# Patient Record
Sex: Female | Born: 1975 | Race: Black or African American | Hispanic: No | Marital: Single
Health system: Midwestern US, Community
[De-identification: ages and names within clinical notes are randomized; demographics above are authoritative.]

## PROBLEM LIST (undated history)

## (undated) DIAGNOSIS — R002 Palpitations: Secondary | ICD-10-CM

## (undated) DIAGNOSIS — D75839 Thrombocytosis, unspecified: Secondary | ICD-10-CM

## (undated) DIAGNOSIS — M549 Dorsalgia, unspecified: Secondary | ICD-10-CM

## (undated) DIAGNOSIS — F329 Major depressive disorder, single episode, unspecified: Secondary | ICD-10-CM

## (undated) DIAGNOSIS — D473 Essential (hemorrhagic) thrombocythemia: Secondary | ICD-10-CM

## (undated) DIAGNOSIS — E119 Type 2 diabetes mellitus without complications: Secondary | ICD-10-CM

## (undated) DIAGNOSIS — F32A Depression, unspecified: Secondary | ICD-10-CM

## (undated) DIAGNOSIS — F319 Bipolar disorder, unspecified: Secondary | ICD-10-CM

## (undated) DIAGNOSIS — F419 Anxiety disorder, unspecified: Secondary | ICD-10-CM

## (undated) DIAGNOSIS — F909 Attention-deficit hyperactivity disorder, unspecified type: Secondary | ICD-10-CM

## (undated) DIAGNOSIS — G8929 Other chronic pain: Secondary | ICD-10-CM

## (undated) HISTORY — DX: Anxiety disorder, unspecified: F41.9

## (undated) HISTORY — DX: Other chronic pain: G89.29

## (undated) HISTORY — PX: CHOLECYSTECTOMY: SHX55

## (undated) HISTORY — DX: Depression, unspecified: F32.A

## (undated) HISTORY — DX: Major depressive disorder, single episode, unspecified: F32.9

## (undated) HISTORY — DX: Attention-deficit hyperactivity disorder, unspecified type: F90.9

## (undated) HISTORY — DX: Bipolar disorder, unspecified: F31.9

## (undated) HISTORY — DX: Dorsalgia, unspecified: M54.9

## (undated) HISTORY — DX: Essential (hemorrhagic) thrombocythemia: D47.3

## (undated) HISTORY — DX: Thrombocytosis, unspecified: D75.839

## (undated) MED ORDER — FLUOXETINE 40 MG CAP
40 mg | ORAL_CAPSULE | Freq: Every day | ORAL | Status: AC
Start: ? — End: 2012-10-25

## (undated) MED ORDER — CLONAZEPAM 2 MG TAB
2 mg | ORAL_TABLET | Freq: Two times a day (BID) | ORAL | Status: AC
Start: ? — End: 2012-10-25

## (undated) MED ORDER — AMPHETAMINE-DEXTROAMPHETAMINE 12.5 MG TAB
12.5 mg | ORAL_TABLET | Freq: Every day | ORAL | Status: AC
Start: ? — End: 2012-10-25

## (undated) MED ORDER — FLURAZEPAM 15 MG CAP: 15 mg | ORAL_CAPSULE | Freq: Every evening | ORAL | Status: AC | PRN

## (undated) MED ORDER — LISDEXAMFETAMINE 70 MG CAPSULE
70 mg | ORAL_CAPSULE | ORAL | Status: AC
Start: ? — End: 2012-10-25

## (undated) MED ORDER — CLONIDINE 0.2 MG TAB: 0.2 mg | ORAL_TABLET | Freq: Every day | ORAL | Status: AC

---

## 2009-07-04 ENCOUNTER — Emergency Department (HOSPITAL_COMMUNITY): Admission: EM | Admit: 2009-07-04 | Discharge: 2009-07-04 | Payer: Self-pay | Admitting: Emergency Medicine

## 2009-07-23 ENCOUNTER — Encounter: Payer: Self-pay | Admitting: Family Medicine

## 2009-08-03 ENCOUNTER — Encounter: Payer: Self-pay | Admitting: *Deleted

## 2009-09-08 ENCOUNTER — Encounter: Payer: Self-pay | Admitting: Family Medicine

## 2009-09-08 ENCOUNTER — Ambulatory Visit: Payer: Self-pay | Admitting: Family Medicine

## 2009-09-08 ENCOUNTER — Other Ambulatory Visit: Admission: RE | Admit: 2009-09-08 | Discharge: 2009-09-08 | Payer: Self-pay | Admitting: Family Medicine

## 2009-09-08 DIAGNOSIS — F319 Bipolar disorder, unspecified: Secondary | ICD-10-CM | POA: Insufficient documentation

## 2009-09-08 DIAGNOSIS — E669 Obesity, unspecified: Secondary | ICD-10-CM | POA: Insufficient documentation

## 2009-09-08 LAB — CONVERTED CEMR LAB: Beta hcg, urine, semiquantitative: NEGATIVE

## 2009-09-09 ENCOUNTER — Encounter: Payer: Self-pay | Admitting: Family Medicine

## 2009-09-09 LAB — CONVERTED CEMR LAB
Alkaline Phosphatase: 61 units/L (ref 39–117)
BUN: 11 mg/dL (ref 6–23)
CO2: 24 meq/L (ref 19–32)
Chloride: 101 meq/L (ref 96–112)
Glucose, Bld: 80 mg/dL (ref 70–99)
HDL: 65 mg/dL (ref >39)
LDL Cholesterol: 85 mg/dL (ref 0–99)
Total Bilirubin: 0.3 mg/dL (ref 0.3–1.2)
Total CHOL/HDL Ratio: 2.6
Total Protein: 6.8 g/dL (ref 6.0–8.3)

## 2009-09-10 ENCOUNTER — Encounter: Payer: Self-pay | Admitting: Family Medicine

## 2009-09-22 ENCOUNTER — Encounter: Payer: Self-pay | Admitting: Family Medicine

## 2009-09-22 ENCOUNTER — Ambulatory Visit: Payer: Self-pay | Admitting: Family Medicine

## 2009-09-22 DIAGNOSIS — Z975 Presence of (intrauterine) contraceptive device: Secondary | ICD-10-CM

## 2009-09-22 LAB — CONVERTED CEMR LAB: Beta hcg, urine, semiquantitative: NEGATIVE

## 2009-09-23 ENCOUNTER — Encounter: Payer: Self-pay | Admitting: Family Medicine

## 2009-09-24 ENCOUNTER — Telehealth: Payer: Self-pay | Admitting: Family Medicine

## 2009-10-12 LAB — CONVERTED CEMR LAB
ALT: 12 units/L (ref 0–35)
Alkaline Phosphatase: 66 units/L (ref 39–117)
BUN: 11 mg/dL (ref 6–23)
Chloride: 104 meq/L (ref 96–112)
Glucose, Bld: 86 mg/dL (ref 70–99)
Potassium: 4 meq/L (ref 3.5–5.3)
Sodium: 140 meq/L (ref 135–145)
Total Bilirubin: 0.3 mg/dL (ref 0.3–1.2)

## 2009-10-20 ENCOUNTER — Ambulatory Visit: Payer: Self-pay | Admitting: Family Medicine

## 2009-10-22 ENCOUNTER — Telehealth: Payer: Self-pay | Admitting: Psychology

## 2009-10-25 ENCOUNTER — Encounter: Payer: Self-pay | Admitting: Family Medicine

## 2009-10-25 ENCOUNTER — Ambulatory Visit: Payer: Self-pay | Admitting: Family Medicine

## 2009-11-03 ENCOUNTER — Encounter: Payer: Self-pay | Admitting: Family Medicine

## 2009-11-03 ENCOUNTER — Ambulatory Visit: Payer: Self-pay | Admitting: Family Medicine

## 2009-11-03 LAB — CONVERTED CEMR LAB
MCHC: 32.6 g/dL (ref 30.0–36.0)
RBC: 4.38 M/uL (ref 3.87–5.11)
RDW: 13.2 % (ref 11.5–15.5)
TSH: 1.45 microintl units/mL (ref 0.350–4.500)
Vitamin B-12: >2000 pg/mL — ABNORMAL HIGH (ref 211–911)

## 2009-11-04 ENCOUNTER — Encounter: Payer: Self-pay | Admitting: Family Medicine

## 2009-11-09 ENCOUNTER — Other Ambulatory Visit: Payer: Self-pay

## 2009-11-09 ENCOUNTER — Other Ambulatory Visit: Payer: Self-pay | Admitting: Emergency Medicine

## 2009-11-10 ENCOUNTER — Inpatient Hospital Stay (HOSPITAL_COMMUNITY): Admission: AD | Admit: 2009-11-10 | Discharge: 2009-11-13 | Payer: Self-pay | Admitting: Psychiatry

## 2009-11-10 ENCOUNTER — Ambulatory Visit: Payer: Self-pay | Admitting: Psychiatry

## 2009-11-15 ENCOUNTER — Ambulatory Visit: Payer: Self-pay | Admitting: Family Medicine

## 2009-11-15 LAB — CONVERTED CEMR LAB

## 2009-11-16 ENCOUNTER — Encounter: Payer: Self-pay | Admitting: Family Medicine

## 2009-11-16 ENCOUNTER — Telehealth (INDEPENDENT_AMBULATORY_CARE_PROVIDER_SITE_OTHER): Payer: Self-pay | Admitting: *Deleted

## 2009-11-17 ENCOUNTER — Encounter: Payer: Self-pay | Admitting: *Deleted

## 2009-11-19 ENCOUNTER — Telehealth: Payer: Self-pay | Admitting: *Deleted

## 2009-11-22 ENCOUNTER — Ambulatory Visit (HOSPITAL_COMMUNITY): Admission: RE | Admit: 2009-11-22 | Discharge: 2009-11-22 | Payer: Self-pay | Admitting: Family Medicine

## 2009-11-23 ENCOUNTER — Encounter: Payer: Self-pay | Admitting: Family Medicine

## 2009-11-24 ENCOUNTER — Telehealth: Payer: Self-pay | Admitting: Family Medicine

## 2009-11-26 ENCOUNTER — Telehealth: Payer: Self-pay | Admitting: Family Medicine

## 2009-12-08 ENCOUNTER — Encounter: Payer: Self-pay | Admitting: Family Medicine

## 2009-12-17 ENCOUNTER — Encounter: Payer: Self-pay | Admitting: Family Medicine

## 2009-12-29 ENCOUNTER — Telehealth: Payer: Self-pay | Admitting: Family Medicine

## 2010-01-06 ENCOUNTER — Encounter: Payer: Self-pay | Admitting: Family Medicine

## 2010-01-06 ENCOUNTER — Emergency Department (HOSPITAL_COMMUNITY): Admission: EM | Admit: 2010-01-06 | Discharge: 2010-01-06 | Payer: Self-pay | Admitting: Emergency Medicine

## 2010-01-11 ENCOUNTER — Telehealth: Payer: Self-pay | Admitting: Family Medicine

## 2010-02-07 ENCOUNTER — Encounter: Payer: Self-pay | Admitting: Family Medicine

## 2010-02-24 ENCOUNTER — Ambulatory Visit: Payer: Self-pay | Admitting: Family Medicine

## 2010-02-24 DIAGNOSIS — F4001 Agoraphobia with panic disorder: Secondary | ICD-10-CM

## 2010-02-24 DIAGNOSIS — F429 Obsessive-compulsive disorder, unspecified: Secondary | ICD-10-CM

## 2010-02-28 ENCOUNTER — Telehealth: Payer: Self-pay | Admitting: Family Medicine

## 2010-03-01 ENCOUNTER — Encounter: Payer: Self-pay | Admitting: Family Medicine

## 2010-03-01 ENCOUNTER — Ambulatory Visit: Payer: Self-pay | Admitting: Family Medicine

## 2010-03-01 DIAGNOSIS — R609 Edema, unspecified: Secondary | ICD-10-CM | POA: Insufficient documentation

## 2010-03-01 LAB — CONVERTED CEMR LAB
AST: 29 units/L (ref 0–37)
Albumin: 4 g/dL (ref 3.5–5.2)
Alkaline Phosphatase: 112 units/L (ref 39–117)
CO2: 26 meq/L (ref 19–32)
Calcium: 9.1 mg/dL (ref 8.4–10.5)
Chloride: 105 meq/L (ref 96–112)
HCT: 38.3 % (ref 36.0–46.0)
MCHC: 32.6 g/dL (ref 30.0–36.0)
Platelets: 337 10*3/uL (ref 150–400)
RBC: 4.21 M/uL (ref 3.87–5.11)
Sodium: 140 meq/L (ref 135–145)
TSH: 1.645 microintl units/mL (ref 0.350–4.500)
Total Bilirubin: 0.2 mg/dL — ABNORMAL LOW (ref 0.3–1.2)
Total Protein: 6.8 g/dL (ref 6.0–8.3)

## 2010-03-03 ENCOUNTER — Telehealth: Payer: Self-pay | Admitting: *Deleted

## 2010-03-10 ENCOUNTER — Ambulatory Visit: Payer: Self-pay | Admitting: Family Medicine

## 2010-03-10 DIAGNOSIS — G4700 Insomnia, unspecified: Secondary | ICD-10-CM

## 2010-03-14 ENCOUNTER — Ambulatory Visit: Payer: Self-pay | Admitting: Family Medicine

## 2010-03-14 DIAGNOSIS — N898 Other specified noninflammatory disorders of vagina: Secondary | ICD-10-CM | POA: Insufficient documentation

## 2010-04-19 ENCOUNTER — Telehealth: Payer: Self-pay | Admitting: Family Medicine

## 2010-08-02 ENCOUNTER — Encounter (INDEPENDENT_AMBULATORY_CARE_PROVIDER_SITE_OTHER): Payer: Self-pay | Admitting: *Deleted

## 2010-09-06 ENCOUNTER — Encounter: Payer: Self-pay | Admitting: Family Medicine

## 2010-10-04 NOTE — Assessment & Plan Note (Signed)
Summary: depression/anxiety   Vital Signs:  Patient profile:   35 year old female Height:      64.5 inches Weight:      198.6 pounds BMI:     33.68 Temp:     98.6 degrees F oral Pulse rate:   76 / minute BP sitting:   137 / 87  (right arm) Cuff size:   regular  Vitals Entered By: Garen Grams LPN (November 04, 9526 3:07 PM) CC: f/u depression/anxiety Is Patient Diabetic? No Pain Assessment Patient in pain? no        Primary Care Provider:  Romero Belling MD  CC:  f/u depression/anxiety.  History of Present Illness: 35 yo female presenting for follow up of what she primarily describes as an anxiety disorder, though in my opinion has presented predominantly as major depressive disorder.  Has been increasingly irritable, aggressive, hopeless, amotivational, for the past couple of weeks.  Also endorses insomnia and passive thoughts of death; though she denies suicidal ideation.  Has increased from 75 mg Venlafaxine to 225 mg  and has been at this dose now for 2 weeks.  Was seen by another physician in this clinic last week for increasing anxiety symptoms, which she attributed to the switch from Clonazepam to Lorazepam, so she was switched back to Clonazepam and was also started on Propranolol.  At this time she doesn't believe the Propranolol is working though she is taking it rarely if ever.  She also does not think the Clonazepam is working, though again she is not taking it regularly.  Also at that visit, she complained of insomnia and was given a prescription for Sonata, though this was not filled because insurance did not cover it.  She has persistent insomnia.  She is not doing anything pleasurable these past few weeks, and essentially seems to be spending her days in bed.  Had a hard time working up the motivation to come to today's visit.  Prior to coming to this clinic, had been on Effexor XR 225 mg by mouth daily and was getting brand name medication from [facility] company assistance  program.  She had been on a much reduced dose of 75 mg by mouth daily of generic Venlafaxine when we met and together we quickly escalated the dose.  She states that generic Venlafaxine is ineffective for her and has brought in a form for the [place] assistance program today.  She has an appointment scheduled with our clinical psychologist, Dr. Pascal Lux, on 03/10.  ** GREATER THAN 25 MINUTES SPENT, MORE THAN 50% IN FACE-TO-FACE COUNSELLING.  Habits & Providers  Alcohol-Tobacco-Diet     Tobacco Status: never  Allergies (verified): No Known Drug Allergies  Physical Exam  General:  Alert and oriented, no acute distress. Psych:  Oriented X3, depressed affect, subdued, withdrawn, poor eye contact, tearful, moderately anxious, easily distracted, poor concentration, and judgment fair.   Additional Exam:  PHQ 9 1 - 3 2 - 3 3 - 3 4 - 3 5 - 3 6 - 3 7 - 3 8 - 3 9 - 2   SUICIDAL?? -- NO TOTAL:  26 10 - DIFFICULTY = EXTREMELY  GAD 7 1 - 3 2 - 3 3 - 3 4 - 3 5 - 3 6 - 3 7 - 3 TOTAL:  21 DIFFICULTY = EXTREMELY    Impression & Recommendations:  Problem # 1:  DEPRESSION, MAJOR, RECURRENT, SEVERE (ICD-296.33) Assessment Deteriorated Very poorly controlled at this point, though is not suicidal.  Has lost her job now that she has been out of work due to mood.  - Continue Effexor XR at 225 mg by mouth daily.  Is currently on generic, which she feels is ineffective, though we completed paperwork today to obtain name brand Effexor XR (75 mg + 150 mg) from the company.  - Have discontinued Lorazepam and restarted Clonazepam, though I have not added Lorazepam to her allergy list as the symptoms she described last week were more consistent with uncontrolled depression rather than side effects.  - Discontinue Propranolol as she wasn't really taking it regularly.  - Encouraged her to keep appointment with Dr. Pascal Lux on 03/10 and f/u with me the week after that.  - Encouraged her to schedule one  pleasurable activity daily--even a walk outside.  - Check the following labs, though doubt there is organic component:  TSH, B12, CBC.  Orders: [facility]- Est  Level 4 (45409)  Problem # 2:  SLEEP DISORDER (ICD-780.50) Assessment: Unchanged Trial of Ambien as needed.  Complete Medication List: 1)  Effexor Xr 75 Mg Xr24h-cap (Venlafaxine hcl) .Marland Kitchen.. 1 tab by mouth daily.  take with effexor xr 150.  brand medically necessary. 2)  Yaz 3-0.02 Mg Tabs (Drospirenone-ethinyl estradiol) .Marland Kitchen.. 1 tab by mouth daily.  disp #1 pack daily. 3)  Klonopin 2 Mg Tabs (Clonazepam) .... Take 1 tab by mouth 2-3 times a day as needed for anxiety 4)  Ambien 10 Mg Tabs (Zolpidem tartrate) .... 1/2 - 1 tab by mouth at bedtime as needed for insomnia 5)  Effexor Xr 150 Mg Xr24h-cap (Venlafaxine hcl) .Marland Kitchen.. 1 tab by mouth daily.  take with effexor xr 75.  brand medically necessary.  Other Orders: TSH-[facility] ([id number]) B12-[facility] [id number]) CBC-[facility] (84696)  Patient Instructions: 1)  Stop Propranolol. 2)  Take Effexor as prescribed--I've provided you prescriptions for mail order assistance. 3)  Take Ambien as needed for sleep. 4)  Take Klonopin as needed. 5)  Call 911 or my clinic immediately if you feel you will harm yourself. 6)  Please schedule a follow-up appointment in 2 weeks.  Prescriptions: EFFEXOR XR 150 MG XR24H-CAP (VENLAFAXINE HCL) 1 tab by mouth daily.  Take with Effexor XR 75.  Brand Medically Necessary.  #30 x 11   Entered and Authorized by:   Romero Belling MD   Signed by:   Romero Belling MD on 11/03/2009   Method used:   Print then Give to Patient   RxID:   [id number] EFFEXOR XR 75 MG XR24H-CAP (VENLAFAXINE HCL) 1 tab by mouth daily.  Take with Effexor XR 150.  Brand Medically Necessary.  #30 x 11   Entered and Authorized by:   Romero Belling MD   Signed by:   Romero Belling MD on 11/03/2009   Method used:   Print then Give to Patient   RxID:   [id number] EFFEXOR XR 150 MG XR24H-CAP  (VENLAFAXINE HCL) 1 tab by mouth daily.  Take with Effexor XR 75.  Brand Medically Necessary.  #30 x 3   Entered and Authorized by:   Romero Belling MD   Signed by:   Romero Belling MD on 11/03/2009   Method used:   Print then Give to Patient   RxID:   [id number] EFFEXOR XR 75 MG XR24H-CAP (VENLAFAXINE HCL) 1 tab by mouth daily.  Take with Effexor XR 150.  Brand Medically Necessary.  #30 x 3   Entered and Authorized by:   Romero Belling MD   Signed by:   Romero Belling  MD on 11/03/2009   Method used:   Print then Give to Patient   RxID:   [id number] KLONOPIN 2 MG TABS (CLONAZEPAM) Take 1 tab by mouth 2-3 times a day as needed for anxiety  #90 x 0   Entered and Authorized by:   Romero Belling MD   Signed by:   Romero Belling MD on 11/03/2009   Method used:   Print then Give to Patient   RxID:   [id number] AMBIEN 10 MG TABS (ZOLPIDEM TARTRATE) 1/2 - 1 tab by mouth at bedtime as needed for insomnia  #30 x 0   Entered and Authorized by:   Romero Belling MD   Signed by:   Romero Belling MD on 11/03/2009   Method used:   Print then Give to Patient   RxID:   [id number]

## 2010-10-04 NOTE — Miscellaneous (Signed)
Summary: med reax  Clinical Lists Changes she was just rx lorazepam. states she has tremors, more depression, not sleeping, feels like when she took the trazadone. states it is not calming at all. feels sad.  U[facility] [address]. she has already made an appt with Dr. Pascal Lux (24th). usually took klonipin which worked at 2mg . told her I will call her after md responds. her mom has taken the pills away from her.Golden Circle RN  October 25, 2009 10:25 AM  Please have her come in for workin today.  I am cross cover and can see her this morning, otherwise SDA this afternoon with someone.  Romero Belling MD  October 25, 2009 10:31 AM  one number has been d/c. lm at other 2 lines.will get her seen today when she calls back.Golden Circle RN  October 25, 2009 10:37 AM   Appended Document: med reax she called back. home is [phone] & cell is [phone]. states she feels bad. asked her to come in asap. he mom will bring her now. states she will be here in 15 minutes

## 2010-10-04 NOTE — Miscellaneous (Signed)
Summary: cp & SOB  Clinical Lists Changes she called & stated she was very SOB and had chest pain. spoke in short choppy sentences. difficult to understand. told her to hang up & call 911 now. she agreed.Golden Circle RN  Jan 06, 2010 10:02 AM

## 2010-10-04 NOTE — Miscellaneous (Signed)
Summary: Therapist  Clinical Lists Changes  Problems: Assessed BIPOLAR DISORDER UNSPECIFIED as comment only - Therapist: Frederic Jericho, MSW, LCSW [address] [place], [place]  [address] Phone:  [phone] FAX:  [phone]       Impression & Recommendations:  Problem # 1:  BIPOLAR DISORDER UNSPECIFIED (ICD-296.80) Assessment Comment Only Therapist: Frederic Jericho, MSW, LCSW [address] [place], [place]  [address] Phone:  [phone] FAX:  [phone]  Complete Medication List: 1)  Effexor Xr 75 Mg Xr24h-cap (Venlafaxine hcl) .Marland Kitchen.. 1 tab by mouth daily.  (being weaned off by psychiatry) brand medically necessary. 2)  Klonopin 1 Mg Tabs (Clonazepam) .Marland Kitchen.. 1 tab by mouth three times a day (being weaned off by psychiatry) 3)  Seroquel 100 Mg Tabs (Quetiapine fumarate) .Marland Kitchen.. 1 tab by mouth at bedtime 4)  Lamictal 25 Mg Tabs (Lamotrigine) .Marland Kitchen.. 1 tab by mouth daily 5)  Terbinafine Hcl 250 Mg Tabs (Terbinafine hcl) .Marland Kitchen.. 1 tab by mouth daily for toe fungus 6)  Benzaclin 1-5 % Gel (Clindamycin phos-benzoyl perox) .... Apply small amount to affected area two times a day for acne.  disp qs 7)  Xyzal 5 Mg Tabs (Levocetirizine dihydrochloride) .Marland Kitchen.. 1 tab by mouth daily for allergies

## 2010-10-04 NOTE — Progress Notes (Signed)
Summary: meds  Phone Note Call from Patient Call back at Home Phone [phone]   Caller: Patient Summary of Call: would like an rx for xyzal for her allergies called [facility] [address] Initial call taken by: De Nurse,  November 24, 2009 12:12 PM  Follow-up for Phone Call        Medicaid won't pay for this unless she tries other meds first.  Please advise to try OTC Zyrtec 10 mg by mouth daily. Follow-up by: Romero Belling MD,  November 24, 2009 12:26 PM  Additional Follow-up for Phone Call Additional follow up Details #1::        Patient states she has already tried that as well as claritin and allegra. States she was rx'd xyzal before and it is the only thing that works. Additional Follow-up by: Garen Grams LPN,  November 25, 2009 9:47 AM    Additional Follow-up for Phone Call Additional follow up Details #2::    Done--please tell patient. Follow-up by: Romero Belling MD,  November 25, 2009 1:37 PM  Additional Follow-up for Phone Call Additional follow up Details #3:: Details for Additional Follow-up Action Taken: Patient informed Additional Follow-up by: Garen Grams LPN,  November 25, 2009 2:45 PM  New/Updated Medications: XYZAL 5 MG TABS (LEVOCETIRIZINE DIHYDROCHLORIDE) 1 tab by mouth daily for allergies Prescriptions: XYZAL 5 MG TABS (LEVOCETIRIZINE DIHYDROCHLORIDE) 1 tab by mouth daily for allergies  #30 x 3   Entered and Authorized by:   Romero Belling MD   Signed by:   Romero Belling MD on 11/25/2009   Method used:   Electronically to        [facility]. [phone]* (retail)       [address]       [place], [place]  [address]       Ph: [phone]       Fax: [phone]   RxID:   [id number]   Appended Document: meds prior Berkley Harvey has been faxed

## 2010-10-04 NOTE — Miscellaneous (Signed)
Summary: Chart Summary  Clinical Lists Changes  Problems: Removed problem of VAGINAL DISCHARGE (ICD-623.5) Assessed ONYCHOMYCOSIS, TOENAILS as comment only - DNKA for LFT check.  Do not refill Terbinafine until CMet. Her updated medication list for this problem includes:    Terbinafine Hcl 250 Mg Tabs (Terbinafine hcl) .Marland Kitchen... 1 tab by mouth daily for toe fungus        Impression & Recommendations:  Problem # 1:  ONYCHOMYCOSIS, TOENAILS (ICD-110.1) Assessment Comment Only DNKA for LFT check.  Do not refill Terbinafine until CMet. Her updated medication list for this problem includes:    Terbinafine Hcl 250 Mg Tabs (Terbinafine hcl) .Marland Kitchen... 1 tab by mouth daily for toe fungus  Complete Medication List: 1)  Effexor Xr 75 Mg Xr24h-cap (Venlafaxine hcl) .Marland Kitchen.. 1 tab by mouth daily.  (being weaned off by psychiatry) brand medically necessary. 2)  Klonopin 1 Mg Tabs (Clonazepam) .Marland Kitchen.. 1 tab by mouth three times a day (being weaned off by psychiatry) 3)  Seroquel 100 Mg Tabs (Quetiapine fumarate) .Marland Kitchen.. 1 tab by mouth at bedtime 4)  Lamictal 25 Mg Tabs (Lamotrigine) .Marland Kitchen.. 1 tab by mouth daily 5)  Terbinafine Hcl 250 Mg Tabs (Terbinafine hcl) .Marland Kitchen.. 1 tab by mouth daily for toe fungus 6)  Benzaclin 1-5 % Gel (Clindamycin phos-benzoyl perox) .... Apply small amount to affected area two times a day for acne.  disp qs 7)  Xyzal 5 Mg Tabs (Levocetirizine dihydrochloride) .Marland Kitchen.. 1 tab by mouth daily for allergies

## 2010-10-04 NOTE — Assessment & Plan Note (Signed)
Summary: iud placement,tcb   Vital Signs:  Patient profile:   35 year old female Height:      64.5 inches Weight:      208 pounds BMI:     35.28 BSA:     2.00 Temp:     98.1 degrees F Pulse rate:   111 / minute BP sitting:   149 / 83  Vitals Entered By: Jone Baseman CMA (September 22, 2009 8:39 AM)  Serial Vital Signs/Assessments:  Comments: 9:12 AM Recheck BP: 122/80 By: Jone Baseman CMA   CC: IUD placement Is Patient Diabetic? No Pain Assessment Patient in pain? no        Primary Care Provider:  Romero Belling MD  CC:  IUD placement.  History of Present Illness: ELEVATED TRANSAMINASES.  Noted incidentally on CMet.  No EtOH use, no APAP use, no h/o hepatitis.  Does take Clonazepam, which can cause transient elevations in AST and ALT.  ANXIETY DISORDER.  Has been on Effexor 75 mg daily for 2 years.  Also takes Clonezepam 2 mg by mouth 1-2 times daily as needed--has taken twice daily for the past 2 weeks as is under more stress with father's recent death.  Crying daily.  Panic attacks twice daily.  No suicidal ideation.  Endorses insomnia.  Main support is her mother.  Not in counseling.  Zaleplon 10 mg does not provide relief of insomnia.  Formerly on Trazodone, which caused body aches.  Formerly on Zoloft, which made her too drowsy.  CONTRACEPTIVE MANAGEMENT.  Discussed IUD at length--both Paragard and Mirena.  All questions answered.  Patient opts for Mirena today.  Denies current fever, vaginal discharge or abdominal pain.  Habits & Providers  Alcohol-Tobacco-Diet     Tobacco Status: never  Allergies (verified): No Known Drug Allergies  Physical Exam  Additional Exam:  VITALS:  Reviewed, hypertensive --> normotensive on recheck GEN: Alert & oriented, no acute distress NECK: Midline trachea, no masses/thyromegaly, no cervical lymphadenopathy CARDIO: Regular rate and rhythm, no murmurs/rubs/gallops, 2+ bilateral radial pulses RESP: Clear to auscultation,  normal work of breathing, no retractions/accessory muscle use ABD: Normoactive bowel sounds, nontender, no masses/hepatosplenomegaly Normal introitus for age, no external lesions, no vaginal discharge, mucosa pink and moist, no vaginal or cervical lesions, no vaginal atrophy, no friaility or hemorrhage, normal uterus size and position, no adnexal masses or tenderness  IUD Placement:  Patient consented.  Betadine x3 swabs applied to cervix.  Tenaculum applied to anterior cervix.  Cervix sounded to 8 cm.  Mirena IUD placed in usual sterile fashion with manufacturer's insertion device.   Strings trimmed to 3 cm.  Patient tolerated procedure well.    Impression & Recommendations:  Problem # 1:  CONTRACEPTIVE MANAGEMENT (ICD-V25.09) Assessment Improved Mirena IUD inserted today.  Follow up in  ~1 month after next period for string check.  Continue Yaz or use condoms in the meantime. Orders: U Preg-[facility] ([address]) IUD insert- [facility] (58300) [facility]- Est  Level 4 ([address]454)  Problem # 2:  TRANSAMINASES, SERUM, ELEVATED (ICD-790.4) Assessment: Unchanged Elevated AST/ALT on screening CMet.  AST just at upper limit of 3x normal.  Will recheck today.  If remain elevated, consider stopping Clonazepam and evaluating other medicines.  Orders: [facility]- Est  Level 4 (99214) Comp Met-[facility] ([id number])  Problem # 3:  ANXIETY DISORDER (ICD-300.00) Assessment: Deteriorated Will not change chronic medicines during acute bereavement period for father.  Will do trial of Zolpidem for insomnia component.  Recommend  Her updated medication list for this  problem includes:    Effexor Xr 75 Mg Xr24h-cap (Venlafaxine hcl) .Marland Kitchen... 1 tab by mouth daily    Clonazepam 2 Mg Tabs (Clonazepam) .Marland Kitchen... 1 tab by mouth one-two times a day as needed anxiety  Orders: [facility]- Est  Level 4 ([address]214)  Complete Medication List: 1)  Effexor Xr 75 Mg Xr24h-cap (Venlafaxine hcl) .Marland Kitchen.. 1 tab by mouth daily 2)  Clonazepam 2 Mg Tabs (Clonazepam) .Marland Kitchen.. 1 tab  by mouth one-two times a day as needed anxiety 3)  Yaz 3-0.02 Mg Tabs (Drospirenone-ethinyl estradiol) .Marland Kitchen.. 1 tab by mouth daily.  disp #1 pack daily. 4)  Zolpidem Tartrate 10 Mg Tabs (Zolpidem tartrate) .... 1/2 - 1 tab by mouth at bedtime as needed for insomnia  Patient Instructions: 1)  After your next period, please come for a string check on your IUD to make sure it is still in place.  In the meantime, you need to use condoms or continue Yaz to make sure you don't get pregnant. 2)  We'll recheck your liver function tests today.  If they are still abnormal, we may want to re-evaluate some of the medicines you are on. 3)  I am sorry to hear about the death of your father.  Please call [facility] if you are interested in becoming involved in a bereavement group:  [phone]. 4)  Please schedule a follow-up appointment in 1 month, sooner if you have your period. Prescriptions: ZOLPIDEM TARTRATE 10 MG TABS (ZOLPIDEM TARTRATE) 1/2 - 1 tab by mouth at bedtime as needed for insomnia  #10 x 0   Entered and Authorized by:   Romero Belling MD   Signed by:   Romero Belling MD on 09/22/2009   Method used:   Print then Give to Patient   RxID:   [id number]   Laboratory Results   Urine Tests  Date/Time Received: September 22, 2009 8:36 AM  Date/Time Reported: September 22, 2009 8:45 AM     Urine HCG: negative Comments: ...............test performed by......Marland KitchenBonnie A. 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 Constance Goltz

## 2010-10-04 NOTE — Progress Notes (Signed)
Summary: Rx Req  Phone Note Refill Request Call back at [phone] Message from:  Patient  Refills Requested: Medication #1:  FLUCONAZOLE 150 MG TABS 1 by mouth for yeast infection.  repeat in 1 week. PT [facility]ES [facility] [address].  Initial call taken by: Clydell Hakim,  April 19, 2010 9:26 AM  Follow-up for Phone Call        Pt needs another office visit if she is having another infection Follow-up by: Angelena Sole MD,  April 19, 2010 9:31 AM  Additional Follow-up for Phone Call Additional follow up Details #1::        [place] said she would go to an Urgent Care and then when she got back call us if it was still going on. Additional Follow-up by: Clydell Hakim,  April 20, 2010 8:34 AM

## 2010-10-04 NOTE — Progress Notes (Signed)
Summary: phn msg  Phone Note Call from Patient Call back at Home Phone [phone]   Caller: Patient Summary of Call: pt saw Dr. Sandi Mealy this week and perscribed sleeping medicine and was told to call back if the 50mg  was not working and she would increase the med.  Also wanted to let Dr. Sandi Mealy know that the swelling has gone down in her feet. Initial call taken by: Clydell Hakim,  March 03, 2010 8:48 AM  Follow-up for Phone Call        okay to take 2 tabs at night to help with sleep until follow up as scheduled 03/10/10 I'm very glad we found the cause for the swelling of the feet and it has improved.  please let her know her labs were normal. Follow-up by: Ancil Boozer  MD,  March 03, 2010 8:59 AM  Additional Follow-up for Phone Call Additional follow up Details #1::        Patient informed, expressed understanding. Additional Follow-up by: Garen Grams LPN,  March 03, 2010 9:03 AM

## 2010-10-04 NOTE — Miscellaneous (Signed)
Summary: Consent IUD Placement  Consent IUD Placement   Imported By: Clydell Hakim 09/27/2009 11:57:42  _____________________________________________________________________  External Attachment:    Type:   Image     Comment:   External Document

## 2010-10-04 NOTE — Progress Notes (Signed)
Summary: Schedule INITIAL [facility]  Phone Note Call from Patient   Caller: Patient Call For: Spero Geralds, Psy.D. Summary of Call: Patient called to schedule an appt.  Her phone was terrible - could only understand half of what she was saying.  Note from last visit not yet done.  Not sure if she needs [facility] or [facility].  Scheduled her in [facility] because appt available 2/24 at 4:00.  Noted recent No Shows and asked patient to call if she could not make the appt. She agreed. Initial call taken by: Spero Geralds PsyD,  October 22, 2009 4:56 PM

## 2010-10-04 NOTE — Miscellaneous (Signed)
Summary: med change  Clinical Lists Changes medicaid will not pay for sonata. I put the form back in your chart box with what medicaid will pay for. also they only pay for #15 per month.Golden Circle RN  November 04, 2009 2:53 PM  Have prescribed Remus Loffler which is covered.  Romero Belling MD  November 04, 2009 2:58 PM

## 2010-10-04 NOTE — Miscellaneous (Signed)
Summary: Medical record request   Clinical Lists Changes  rec'd medical record request to go to: Mental health  Date sent: 05/27/10 Marily Memos  August 02, 2010 3:06 PM

## 2010-10-04 NOTE — Progress Notes (Signed)
Summary: triage  Phone Note Call from Patient Call back at [phone]   Caller: Patient Summary of Call: Pt had iud placed the 19th and now having a white discharge.  Wondering if she might have an infection? Initial call taken by: Clydell Hakim,  September 24, 2009 8:56 AM  Follow-up for Phone Call        the given number has been d/c'd. called work number.  she does not attend [employer] or work there per [employer]. will wait for her to call back Follow-up by: Golden Circle RN,  September 24, 2009 9:05 AM  Additional Follow-up for Phone Call Additional follow up Details #1::        Called back on given number: d/c'd. Called back on home number:  wrong number. Additional Follow-up by: Romero Belling MD,  September 27, 2009 4:04 PM

## 2010-10-04 NOTE — Letter (Signed)
Summary: CBC, TSH, B12 results  [facility]  [address]   [place], [place] [address]   Phone: [phone]  Fax: [phone]    11/03/2009  [facility] [address] [place], [place]  [address]  Dear Ms. Kathreen Cosier,  Your blood counts and thyroid function were normal today, though your Vitamin B12 level was quite high.  If you are taking a supplement that contains Vitamin B12 I would recommend discontinuing that at this point.  Sincerely, Romero Belling MD  Appended Document: CBC, TSH, B12 results mailed.

## 2010-10-04 NOTE — Progress Notes (Signed)
Summary: Rx Ques  Phone Note Call from Patient Call back at Home Phone [phone]   Caller: Patient Summary of Call: Pt just started on Trileptal and now noticing feet swelling and tender.  Could this be a side effect. Initial call taken by: Clydell Hakim,  February 28, 2010 9:29 AM  Follow-up for Phone Call        wll forward message to MD. Follow-up by: Theresia Lo RN,  February 28, 2010 10:00 AM  Additional Follow-up for Phone Call Additional follow up Details #1::        Please have patient schedule an office visit. Additional Follow-up by: Romero Belling MD,  February 28, 2010 1:31 PM    Additional Follow-up for Phone Call Additional follow up Details #2::    appointment scheduled for tomorrow  AM at 8:30 for work in Follow-up by: Theresia Lo RN,  February 28, 2010 4:38 PM

## 2010-10-04 NOTE — Miscellaneous (Signed)
Summary: out of clinda gel  Clinical Lists Changes states the clind-per gel is almost out & pharmacy will not refill without a prior auth. Medicaid will pay for Benzaclin. to pcp to change it. must try & fail that before prior auth will go thru.Golden Circle RN  December 08, 2009 4:20 PM  done  Romero Belling MD  December 08, 2009 4:31 PM    Medications: Rx of BENZACLIN 1-5 % GEL (CLINDAMYCIN PHOS-BENZOYL PEROX) Apply small amount to affected area two times a day for acne.  disp qs;  #1 x 6;  Signed;  Entered by: Romero Belling MD;  Authorized by: Romero Belling MD;  Method used: Electronically to [facility] [phone]*, [address], [place], [place], [place]  [address], Ph: [phone], Fax: [phone]    Prescriptions: BENZACLIN 1-5 % GEL (CLINDAMYCIN PHOS-BENZOYL PEROX) Apply small amount to affected area two times a day for acne.  disp qs  #1 x 6   Entered and Authorized by:   Romero Belling MD   Signed by:   Romero Belling MD on 12/08/2009   Method used:   Electronically to        [facility] [phone]* (retail)       [address]       [place], [place]  [address]       Ph: [phone]       Fax: [phone]   RxID:   [id number]  told her the md had changed her rx & this one should be covered & will work as well.Golden Circle RN  December 08, 2009 4:47 PM

## 2010-10-04 NOTE — Letter (Signed)
Summary: Lipid Letter, Elevated AST/ALT  [facility]  [address]   [place], [place] [address]   Phone: [phone]  Fax: [phone]    09/09/2009  Laura Horton [address] [place] [employer] [place], [place]  [address]  Dear [name]:  We have carefully reviewed your last lipid profile from 09/08/2009 and the results are noted below with a summary of recommendations for lipid management.    Cholesterol:       172     Goal: <200   HDL "good" Cholesterol:   65     Goal: >40   LDL "bad" Cholesterol:   85     Goal: <130   Triglycerides:       108     Goal: <150  You are at goal for your cholesterol.  Your other labs did indicate a slight elevation in your liver function tests.  It was not severe, but I would like to discuss this in more detail at your next visit later this month.  These can be transient changes, but can also be due to medications, infection, or alcohol use.  We will most likely repeat this test when you return and see if it is normal on that day--if so, then no further workup is necessary.  Sincerely,  [facility] Romero Belling MD  Appended Document: Lipid Letter, Elevated AST/ALT mailed.

## 2010-10-04 NOTE — Assessment & Plan Note (Signed)
Summary: med manangement,tcb   Vital Signs:  Patient profile:   35 year old female Height:      64.5 inches Weight:      239.1 pounds BMI:     40.55 Temp:     98.3 degrees F oral Pulse rate:   105 / minute BP sitting:   119 / 75  (left arm) Cuff size:   regular  Vitals Entered By: Garen Grams LPN (February 24, 2010 8:51 AM) CC: med management Is Patient Diabetic? No Pain Assessment Patient in pain? no        Primary Care Provider:  Romero Belling MD  CC:  med management.  History of Present Illness: 35 yo female here for follow up of bipolar disorder, panic disorder, OCD.  Meds have been prescribed by [facility].  Had been on Seroquel, Lamictal, Klonopin, Effexor.  But these were all stopped (except for Klonopin) secondary to worries about 30 pound weight gain on these.  Then on May 2, Dr. Thompson Grayer at [facility] started her on Seroquel, Prozac, Geodon, (continued Klonopin).  On these meds, she had allergic reaction involving swollen tongue stiffness of neck, dyspnea.  Given Pepcid and Benadryl at ED, sent home on Prednisone x5 days.  Returned to [facility] and was put on Risperidone on May 5.  Then enrolled in outpatient program at [facility] psychiatric facility in [place] (2 weeks up until May 31).  She was discharged from [facility] on the following medications:  CLONAZEPAM 1 MG TABS (CLONAZEPAM) 1 tab by mouth three times a day as needed for anxiety HYDROXYZINE PAMOATE 25 MG CAPS (HYDROXYZINE PAMOATE) 1 tab by mouth every 4 hours as needed for anxiety ABILIFY 10 MG TABS (ARIPIPRAZOLE) 1 tab by mouth every morning OXCARBAZEPINE 300 MG TABS (OXCARBAZEPINE) 2 tab by mouth two times a day in the morning and at bedtime FLUOXETINE HCL 40 MG CAPS (FLUOXETINE HCL) 1 cap by mouth every morning REMERON 45 MG TABS (MIRTAZAPINE) 1 tab by mouth at bedtime for insomnia  At this time she states her mood is okay, denies tearfulness, occasionally sad, is quite upset about the  weight gain from Seroquel (confirmed 30 pound weight gain since March).  Denies suicidal ideation now or in the past.  Good sleep.  Good eating.  Exercising.  Poor concentration, endorses anhedonia.  Denies manic symptoms.  Anxious most days and using Clonazepam tid and Hydroxyzine approximately once per day.  Sees therapist to work on being around large crowds and dealing with anxiety.  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 1 Tab By Mouth Three Times A Day As Needed For Anxiety 2)  Hydroxyzine Pamoate 25 Mg Caps (Hydroxyzine Pamoate) .Marland Kitchen.. 1 Tab By Mouth Every 4 Hours As Needed For Anxiety 3)  Abilify 10 Mg Tabs (Aripiprazole) .Marland Kitchen.. 1 Tab By Mouth Every Morning 4)  Oxcarbazepine 300 Mg Tabs (Oxcarbazepine) .... 2 Tab By Mouth Two Times A Day in The Morning and At Bedtime 5)  Fluoxetine Hcl 40 Mg Caps (Fluoxetine Hcl) .Marland Kitchen.. 1 Cap By Mouth Every Morning 6)  Remeron 45 Mg Tabs (Mirtazapine) .Marland Kitchen.. 1 Tab By Mouth At Bedtime For Insomnia  Allergies (verified):  1)  ! Seroquel (Quetiapine Fumarate) 2)  ! Geodon (Ziprasidone Hcl)  Physical Exam  Additional Exam:  VITALS:  Reviewed, normal GEN: Alert & oriented, no acute distress NECK: Midline trachea, no masses/thyromegaly, no cervical lymphadenopathy CARDIO: Regular rate and rhythm, no murmurs/rubs/gallops, 2+ bilateral radial pulses RESP: Clear to auscultation, normal work of breathing, no retractions/accessory muscle use EXT: Nontender, no edema PSYCH:  Cognition and judgment appear intact. Alert and cooperative with normal attention span and concentration. No apparent  delusions, illusions, hallucinations.  Normal affect.  Not depressed or anxious appearing.   Impression & Recommendations:  Problem # 1:  BIPOLAR DISORDER UNSPECIFIED (ICD-296.80) Assessment Improved  Much improved on new medication regimen.  We discussed that I am not comfortable managing this combination of psychiatric medications longterm, but Ms. Gendron is concerned because she doesn't have refills to get her through until her psychiatry appointment at [facility], which is scheduled for  05/02/2010.  Advised that I would provide 1 month refill on medicines and patient should follow up with new PCP in July for a second refill to get her through to the psychiatry appointment.  Orders: [facility]- Est  Level 4 ([address]253)  Problem # 2:  OBSESSIVE-COMPULSIVE DISORDER (ICD-300.3) Assessment: Improved  See above.  Orders: [facility]- Est  Level 4 (66440)  Problem # 3:  PANIC DISORDER WITH AGORAPHOBIA (ICD-300.21) Assessment: Improved  See above.  Orders: [facility]- Est  Level 4 (34742)  Problem # 4:  OBESITY (ICD-278.00) Assessment: Deteriorated  Quite concerned about weight gain on medications.  Is exercising.  We discussed need to stay on meds to stabilize, then consider weight issues in the long term.  Encouraged continued exercise.  Will refer to Dr. Gerilyn Pilgrim for nutrition counseling as well. Ht: 64.5 (02/24/2010)   Wt: 239.1 (02/24/2010)   BMI: 40.55 (02/24/2010)  Orders: [facility]- Est  Level 4 (59563)  Complete Medication List: 1)  Clonazepam 1 Mg Tabs (Clonazepam) .Marland Kitchen.. 1 tab by mouth three times a day as needed for anxiety 2)  Hydroxyzine Pamoate 25 Mg Caps (Hydroxyzine pamoate) .Marland Kitchen.. 1 tab by mouth every 4 hours as needed for anxiety 3)  Abilify 10 Mg Tabs (Aripiprazole) .Marland Kitchen.. 1 tab by mouth every morning 4)  Oxcarbazepine 300 Mg Tabs (Oxcarbazepine) .... 2 tab by mouth two times a day in the morning and at bedtime 5)  Fluoxetine Hcl 40 Mg Caps (Fluoxetine hcl) .Marland Kitchen.. 1 cap by mouth every  morning 6)  Remeron 45 Mg Tabs (Mirtazapine) .Marland Kitchen.. 1 tab by mouth at bedtime for insomnia  Patient Instructions: 1)  Please make an appointment at our front desk with Dr. Gerilyn Pilgrim, our nutritionist. 2)  Please schedule a follow-up appointment in 2 weeks with your new doctor. 3)  Call anytime if you have worsening anxiety or mood. 4)  Go immediately to the emergency room if you have any more head, neck, tongue or breathing symptoms like your last allergic reaction. Prescriptions: CLONAZEPAM 1 MG TABS (CLONAZEPAM) 1 tab by mouth three times a day as needed for anxiety  #90 x 0   Entered and Authorized by:   Romero Belling MD   Signed by:   Romero Belling MD on 02/24/2010   Method used:   Print then Give to Patient   RxID:   [id number] REMERON 45 MG TABS (MIRTAZAPINE) 1 tab by mouth at bedtime for insomnia  #30 x 0   Entered and Authorized by:   Romero Belling MD   Signed by:   Romero Belling MD  on 02/24/2010   Method used:   Electronically to        [facility] [phone]* (retail)       [address]       [place], [place]  [address]       Ph: [phone]       Fax: [phone]   RxID:   [id number] FLUOXETINE HCL 40 MG CAPS (FLUOXETINE HCL) 1 cap by mouth every morning  #30 x 0   Entered and Authorized by:   Romero Belling MD   Signed by:   Romero Belling MD on 02/24/2010   Method used:   Electronically to        [facility] [phone]* (retail)       [address]       [place], [place]  [address]       Ph: [phone]       Fax: [phone]   RxID:   [id number] OXCARBAZEPINE 300 MG TABS (OXCARBAZEPINE) 2 tab by mouth two times a day in the morning and at bedtime  #120 x 0   Entered and Authorized by:   Romero Belling MD   Signed by:   Romero Belling MD on 02/24/2010   Method used:   Electronically to        [facility] [phone]* (retail)       [address]       [place], [place]  [address]       Ph: [phone]       Fax: [phone]   RxID:   [id number] ABILIFY 10 MG TABS (ARIPIPRAZOLE) 1 tab by mouth every morning  #30 x 0   Entered and Authorized by:   Romero Belling MD   Signed by:   Romero Belling MD on 02/24/2010   Method used:   Electronically to        [facility] [phone]* (retail)       [address]       [place], [place]  [address]       Ph: [phone]       Fax: [phone]   RxID:   [id number] HYDROXYZINE PAMOATE 25 MG CAPS (HYDROXYZINE PAMOATE) 1 tab by mouth every 4 hours as needed for anxiety  #30 x 0   Entered and Authorized by:   Romero Belling MD   Signed by:   Romero Belling MD on 02/24/2010   Method used:   Electronically to        [facility] [phone]* (retail)       [address]       [place], [place]  [address]       Ph: [phone]       Fax: [phone]   RxID:   [id number]

## 2010-10-04 NOTE — Assessment & Plan Note (Signed)
Summary: f/up,tcb   Vital Signs:  Patient profile:   35 year old female Height:      64.5 inches Weight:      199 pounds BMI:     33.75 BSA:     1.96 Temp:     98.6 degrees F Pulse rate:   125 / minute BP sitting:   128 / 88  Vitals Entered By: Jone Baseman CMA (October 20, 2009 3:57 PM) CC: Anxiety, IUD check Is Patient Diabetic? No Pain Assessment Patient in pain? no        Primary Care Provider:  Romero Belling MD  CC:  Anxiety and IUD check.  History of Present Illness: 35 yo female presenting for:  ANXIETY DISORDER.  Reports daily panic attacks.  Is taking Venlafaxine 75 daily, formerly on much higher dose (225).  Taking Clonazepam two times a day without relief.  Endorses insomina, amotivation, anhedonia, decreased appetite.  Denies suicidal ideation.  IUD STRING CHECK.  Deferred today given mood.  Not sexually active since IUD placed.  Advised condoms with sex until IUD strings confirmed.  ** Greater than 25 minutes spent with patient, more than 50% in face to face counseling.  Habits & Providers  Alcohol-Tobacco-Diet     Tobacco Status: never  Current Medications (verified): 1)  Effexor Xr 75 Mg Xr24h-Cap (Venlafaxine Hcl) .... 2 Tabs By Mouth Daily X1 Week, Then 3 Tabs By Mouth Daily 2)  Lorazepam 2 Mg Tabs (Lorazepam) .Marland Kitchen.. 1 Tab By Mouth Up To Three Times A Day As Needed For Anxiety 3)  Yaz 3-0.02 Mg Tabs (Drospirenone-Ethinyl Estradiol) .Marland Kitchen.. 1 Tab By Mouth Daily.  Disp #1 Pack Daily. 4)  Zolpidem Tartrate 10 Mg Tabs (Zolpidem Tartrate) .... 1/2 - 1 Tab By Mouth At Bedtime As Needed For Insomnia  Allergies (verified): No Known Drug Allergies  Physical Exam  General:  Alert and oriented, anxious. Psych:  Oriented X3, memory intact for recent and remote, normally interactive, good eye contact, depressed affect, and moderately anxious.     Impression & Recommendations:  Problem # 1:  ANXIETY DISORDER (ICD-300.00) Assessment Deteriorated Discussed  increasing dose quickly to former dose.  Discussed importance of regular dosing (she is doing that currently, but in past would take medicine intermittently).  Advised scheduling 1 pleasurable activity daily.  Lorazepam as needed for short term.  Follow up in 2 weeks.  Has good support system with mother.  Takes care of son.  Denies suicidal ideation. Her updated medication list for this problem includes:    Effexor Xr 75 Mg Xr24h-cap (Venlafaxine hcl) .Marland Kitchen... 2 tabs by mouth daily x1 week, then 3 tabs by mouth daily    Lorazepam 2 Mg Tabs (Lorazepam) .Marland Kitchen... 1 tab by mouth up to three times a day as needed for anxiety  Orders: [facility]- Est  Level 4 ([address])  Problem # 2:  CONTRACEPTIVE MANAGEMENT (ICD-V25.09) Assessment: Comment Only IUD string check deferred this visit.  Advised condoms until confirmed.  Complete Medication List: 1)  Effexor Xr 75 Mg Xr24h-cap (Venlafaxine hcl) .... 2 tabs by mouth daily x1 week, then 3 tabs by mouth daily 2)  Lorazepam 2 Mg Tabs (Lorazepam) .Marland Kitchen.. 1 tab by mouth up to three times a day as needed for anxiety 3)  Yaz 3-0.02 Mg Tabs (Drospirenone-ethinyl estradiol) .Marland Kitchen.. 1 tab by mouth daily.  disp #1 pack daily. 4)  Zolpidem Tartrate 10 Mg Tabs (Zolpidem tartrate) .... 1/2 - 1 tab by mouth at bedtime as needed for insomnia  Patient  Instructions: 1)  I have looked up the Effexor Patient Assistance Program at [place], and the only thing I could find was a $4 per month prescription, but this is not available to patients with Medicaid.  The other option is to go to the [facility] Program--they are very good at working with companies to get brand name medications for patients when indicated. 2)  Increase your dose of Effexor as follows:  take 2 tabs daily for 1 week, then 3 tabs daily every day thereafter. 3)  Schedule 1 pleasurable activity daily. 4)  I have provided a prescription for Lorazepam to replace the Clonazepam.  You can use  this up to 3 times per day (including once at bedtime as needed).  Remember, this is short term only.  I anticipate tapering this off with you in the next few weeks once you are feeling better. 5)  Please schedule a follow-up appointment in 2 weeks.  Prescriptions: EFFEXOR XR 75 MG XR24H-CAP (VENLAFAXINE HCL) 2 tabs by mouth daily x1 week, then 3 tabs by mouth daily  #90 x 0   Entered and Authorized by:   Romero Belling MD   Signed by:   Romero Belling MD on 10/20/2009   Method used:   Print then Give to Patient   RxID:   [id number] LORAZEPAM 2 MG TABS (LORAZEPAM) 1 tab by mouth up to three times a day as needed for anxiety  #90 x 0   Entered and Authorized by:   Romero Belling MD   Signed by:   Romero Belling MD on 10/20/2009   Method used:   Print then Give to Patient   RxID:   [id number]

## 2010-10-04 NOTE — Progress Notes (Signed)
Summary: triage  Phone Note Call from Patient Call back at Home Phone [phone]   Caller: Patient Summary of Call: Wondering if a person can become lactose intorant.  She has been having a reaction to milk and creams recently. Initial call taken by: Clydell Hakim,  December 29, 2009 12:20 PM  Follow-up for Phone Call        gets cramps, bloated, gassy. started 2-3 months ago.  uses creme in coffee, eats ice creme, cheeze. appt made for tomorrow at 9:15am Follow-up by: Golden Circle RN,  December 29, 2009 12:25 PM

## 2010-10-04 NOTE — Assessment & Plan Note (Signed)
Summary: see md notes/[place]   Vital Signs:  Patient profile:   35 year old female Height:      64.5 inches Weight:      198.1 pounds BMI:     33.60 Temp:     98.6 degrees F oral Pulse rate:   96 / minute BP sitting:   124 / 85  (left arm) Cuff size:   regular  Vitals Entered By: Garen Grams LPN (October 25, 2009 4:29 PM) CC: Anxiety Is Patient Diabetic? No Pain Assessment Patient in pain? no        Primary Care Provider:  Romero Belling MD  CC:  Anxiety.  History of Present Illness: 1. Anxiety:  Pt is here because she has felt more anxious since medication changes were made.  She thinks that the Lorazepam is not working.  She was on Klonopin and was switched to Lorazepam.  She was also put on generic Effexor.  She is complaining of nervousness, anxiety, tremors, difficulty sleeping, heart racing, depressed mood.      ROS: denies SI    2. Difficulty sleeping: Pt used to be on Zaleplon.  She thinks that she needs this in order to get a good night sleep.  Current Medications (verified): 1)  Effexor Xr 75 Mg Xr24h-Cap (Venlafaxine Hcl) .... 2 Tabs By Mouth Daily X1 Week, Then 3 Tabs By Mouth Daily 2)  Yaz 3-0.02 Mg Tabs (Drospirenone-Ethinyl Estradiol) .Marland Kitchen.. 1 Tab By Mouth Daily.  Disp #1 Pack Daily. 3)  Klonopin 2 Mg Tabs (Clonazepam) .... Take 1 Tab By Mouth 2-3 Times A Day As Needed For Anxiety 4)  Propranolol Hcl 40 Mg Tabs (Propranolol Hcl) .... Take 1 Tab By Mouth Three Times A Day As Needed For Anxiety 5)  Zaleplon 10 Mg Caps (Zaleplon) .... Take 1 Tab By Mouth At Bedtime  Allergies: No Known Drug Allergies  Past History:  Past Medical History: Reviewed history from 09/08/2009 and no changes required. [id number] -- NSVD x1 2003, SAB x2 2000 and 2001, TAB 2004 for ectopic pregnancy.  Social History: Reviewed history from 09/08/2009 and no changes required. High School graduate.  Lives with son Shyla Gayheart dob 10/08/2001) and mother Kattia Selley dob 1914).   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  Never used tobacco.  No EtOH or illicit drugs.  Moved from [place], [place], to [place] in August 2010.  Physical Exam  General:  Vitals reviewed. Tachycardic.  Alert and oriented, anxious. Eyes:  Normal conjunctiva, PERRL Neck:  supple and no masses.   Lungs:  normal respiratory effort, normal breath sounds, and no wheezes.   Heart:  regular rhythm, no murmur, and tachycardia.   Abdomen:  S / NT / ND Extremities:  no lower extremity edema Skin:  turgor normal, color normal, and no rashes.   Psych:  not suicidal and moderately anxious.     Impression & Recommendations:  Problem # 1:  ANXIETY DISORDER (ICD-300.00) Assessment Deteriorated  Will switch back to Klonopin and increase dose slightly.  Will also add Propranolol for sympathetic down regulation. The following medications were removed from the medication list:    Lorazepam 2 Mg Tabs (Lorazepam) .Marland Kitchen... 1 tab by mouth up to three times a day as needed for anxiety Her updated medication list for this problem includes:    Effexor Xr 75 Mg Xr24h-cap (Venlafaxine hcl) .Marland Kitchen... 2 tabs by mouth daily x1 week, then 3 tabs by mouth daily    Klonopin 2 Mg Tabs (Clonazepam) .Marland Kitchen... Take 1 tab  by mouth 2-3 times a day as needed for anxiety  Orders: [facility]- Est Level  3 (87564)  Problem # 2:  SLEEP DISORDER (ICD-780.50) Assessment: New  Will also restart Zaleplon  Orders: [facility]- Est Level  3 (33295)  Complete Medication List: 1)  Effexor Xr 75 Mg Xr24h-cap (Venlafaxine hcl) .... 2 tabs by mouth daily x1 week, then 3 tabs by mouth daily 2)  Yaz 3-0.02 Mg Tabs (Drospirenone-ethinyl estradiol) .Marland Kitchen.. 1 tab by mouth daily.  disp #1 pack daily. 3)  Klonopin 2 Mg Tabs (Clonazepam) .... Take 1 tab by mouth 2-3 times a day as needed for anxiety 4)  Propranolol Hcl 40 Mg Tabs (Propranolol hcl) .... Take 1 tab by mouth three times a day as needed for anxiety 5)  Zaleplon 10 Mg Caps (Zaleplon) .... Take 1  tab by mouth at bedtime  Patient Instructions: 1)  We are going to do the following changes 2)  Stop taking the Lorazepam and start taking the Klonopin again.  You can take it up to 3 times / day for anxiety 3)  I am also going to start this new medicine Propranolol for the anxiety.  It works differently than the Klonopin and Lorazepam so hopefully it will help. 4)  I have also prescribed the Zaleplon that you had been taking for your sleep. 5)  Please keep your appointment with Dr. Pascal Lux 6)  Follow up with Dr. Constance Goltz next week 7)  Return to clinic sooner or go to the Emergency Room if your anxiety / depression gets worse or if you start having any thoughts of suicide. Prescriptions: ZALEPLON 10 MG CAPS (ZALEPLON) Take 1 tab by mouth at bedtime  #30 x 0   Entered and Authorized by:   Angelena Sole MD   Signed by:   Angelena Sole MD on 10/25/2009   Method used:   Print then Give to Patient   RxID:   [id number] PROPRANOLOL HCL 40 MG TABS (PROPRANOLOL HCL) Take 1 tab by mouth three times a day as needed for anxiety  #90 x 0   Entered and Authorized by:   Angelena Sole MD   Signed by:   Angelena Sole MD on 10/25/2009   Method used:   Print then Give to Patient   RxID:   [id number]

## 2010-10-04 NOTE — Assessment & Plan Note (Signed)
Summary: feet swelling/ls   Vital Signs:  Patient profile:   35 year old female Height:      64.5 inches Weight:      237 pounds BMI:     40.20 BSA:     2.12 Temp:     99.0 degrees F Pulse rate:   100 / minute BP sitting:   117 / 80  Vitals Entered By: Jone Baseman CMA (March 01, 2010 8:33 AM) CC: feet swelling x 3 days Is Patient Diabetic? No Pain Assessment Patient in pain? yes     Location: feet Intensity: 8   Primary Care Provider:  Romero Belling MD  CC:  feet swelling x 3 days.  History of Present Illness: feet swelling: x3 days.  also tender over heels.  no known injury.  has been on new medicines recently and she thinks this may be a side effect of these.  both legs are swollen.  does note some shortness of breath recently as well.  denies chest pains or palpitations.    Habits & Providers  Alcohol-Tobacco-Diet     Tobacco Status: never  Current Medications (verified): 1)  Clonazepam 1 Mg Tabs (Clonazepam) .Marland Kitchen.. 1 Tab By Mouth Three Times A Day As Needed For Anxiety 2)  Hydroxyzine Pamoate 25 Mg Caps (Hydroxyzine Pamoate) .Marland Kitchen.. 1 Tab By Mouth Every 4 Hours As Needed For Anxiety 3)  Abilify 10 Mg Tabs (Aripiprazole) .Marland Kitchen.. 1 Tab By Mouth Every Morning 4)  Oxcarbazepine 300 Mg Tabs (Oxcarbazepine) .... 2 Tab By Mouth Two Times A Day in The Morning and At Bedtime 5)  Fluoxetine Hcl 40 Mg Caps (Fluoxetine Hcl) .Marland Kitchen.. 1 Cap By Mouth Every Morning 6)  Amitriptyline Hcl 50 Mg Tabs (Amitriptyline Hcl) .Marland Kitchen.. 1 By Mouth At Bedtime For Insomnia  Allergies (verified): 1)  ! Seroquel (Quetiapine Fumarate) 2)  ! Geodon (Ziprasidone Hcl)  Past History:  Past medical, surgical, family and social histories (including risk factors) reviewed for relevance to current acute and chronic problems.  Past Medical History: Reviewed history from 09/08/2009 and no changes required. [id number] -- NSVD x1 2003, SAB x2 2000 and 2001, TAB 2004 for ectopic pregnancy.  Past Surgical  History: Reviewed history from 09/08/2009 and no changes required. TAB (D&C) 2004 for ectopic pregnancy.  Family History: Reviewed history from 09/08/2009 and no changes required. Mother alive:  HLD, HTN, glucose intolerance Father alive:  lung cancer Brother alive and healthy. Sister #1 alive:  HTN Sister #2 alive and healthy. Sister #3 alive and healthy. Son (born 2003)  alive and healthy. Maternal Grandmother:  died from MI at age 51. No breast cancer in mother or sisters.  Social History: Reviewed history from 09/08/2009 and no changes required. High School graduate.  Lives with son Deliana Avalos dob 10/08/2001) and mother Chevella Pearce dob [birth year]1).  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  Never used tobacco.  No EtOH or illicit drugs.  Moved from [place], [place], to [place] in August 2010.  Review of Systems       per HPI  Physical Exam  General:  Well-developed,well-nourished,in no acute distress; alert,appropriate and cooperative throughout examination Msk:  tender at insertion of plantar fascia bilaterally.  Extremities:  bilateral lower extremities with 1+ pitting edema.  no erythema or warmth.  no cords palpable.  edema is symmetric.    Impression & Recommendations:  Problem # 1:  PERIPHERAL EDEMA (ICD-782.3) Assessment New suspect this is a medication side effect (namely remeron).  will stop this.  needs to  be on sleep medicine per her report as not getting sleep makes her bipolar flare. she reports not having success with ambien, ativan or trazodone.  hasn't tried lunesta or amitryptilline.  in viewing drug interaction checker on epocrates amitryptilline should be safe for her to use so will start with this given less cost as well.  f/u as scheduled 7/7 to see if working, if edema has resolved. will check in the mean time though for other causes (kidneys, liver, anemia, thyroid) with lab work Orders: Comp Met-[facility] [phone]) CBC-[facility] (09811) TSH-[facility]  [phone]) [facility]- Est Level  3 (13086)  Complete Medication List: 1)  Clonazepam 1 Mg Tabs (Clonazepam) .Marland Kitchen.. 1 tab by mouth three times a day as needed for anxiety 2)  Hydroxyzine Pamoate 25 Mg Caps (Hydroxyzine pamoate) .Marland Kitchen.. 1 tab by mouth every 4 hours as needed for anxiety 3)  Abilify 10 Mg Tabs (Aripiprazole) .Marland Kitchen.. 1 tab by mouth every morning 4)  Oxcarbazepine 300 Mg Tabs (Oxcarbazepine) .... 2 tab by mouth two times a day in the morning and at bedtime 5)  Fluoxetine Hcl 40 Mg Caps (Fluoxetine hcl) .Marland Kitchen.. 1 cap by mouth every morning 6)  Amitriptyline Hcl 50 Mg Tabs (Amitriptyline hcl) .Marland Kitchen.. 1 by mouth at bedtime for insomnia  Patient Instructions: 1)  Stop the remeron 2)  Start the amitryptilline for sleep 3)  monitor for any new side effects or for your feet not improving slowly. 4)  If there is anything abnormal on your labs I will call you.  5)  Please follow up in 3-4 weeks with your new doctor about your medications.  Prescriptions: AMITRIPTYLINE HCL 50 MG TABS (AMITRIPTYLINE HCL) 1 by mouth at bedtime for insomnia  #30 x 0   Entered and Authorized by:   Ancil Boozer  MD   Signed by:   Ancil Boozer  MD on 03/01/2010   Method used:   Electronically to        [facility] [address] [phone]* (retail)       [address] [place], [place]  [address]       Ph: [phone]       Fax: [phone]   RxID:   [id number]

## 2010-10-04 NOTE — Progress Notes (Signed)
Summary: question  Phone Note Call from Patient Call back at Home Phone [phone]   Caller: Patient Summary of Call: pt has questions about letter that was just received Initial call taken by: De Nurse,  November 26, 2009 2:52 PM  Follow-up for Phone Call        I told her what ovarian cysts are & what hemmorhagic meant. assured her many women get this and they reslove on their own.   she does not have any pain. told her it will go away. if she develops pain, call for appt. she was reassured Follow-up by: Golden Circle RN,  November 26, 2009 2:58 PM  Additional Follow-up for Phone Call Additional follow up Details #1::        Appreciate Sally's better explanation. Additional Follow-up by: Romero Belling MD,  November 27, 2009 10:51 PM

## 2010-10-04 NOTE — Letter (Signed)
Summary: IUD in correct position  [facility]  [address]   [place], [place] [address]   Phone: [phone]  Fax: [phone]    11/23/2009  Laura Horton [address] [place], [place]  [address]  Dear [name],  The ultrasound confirmed that your IUD is in the correct place.  I could not see the strings on exam because they are up a little higher than normal, but this is not a problem.  At your next Pap we might want to try to reposition the strings so they are visible.  This IUD will function normally.  They also found a complex right ovarian cyst with features compatible with a hemorrhagic cyst.  Given the size and appearance in this asymptomatic premenopausal patient, no further follow-up is necessary.  Sincerely, Romero Belling MD  Appended Document: IUD in correct position mailed

## 2010-10-04 NOTE — Miscellaneous (Signed)
Summary: non ob US approved  Clinical Lists Changes medsolutions approved the non-ob ultrasound #[id number].Golden Circle RN  November 17, 2009 9:01 AM

## 2010-10-04 NOTE — Assessment & Plan Note (Signed)
Summary: NP,tcb   Vital Signs:  Patient profile:   35 year old female Height:      64.5 inches Weight:      203 pounds BMI:     34.43 BSA:     1.98 Temp:     98.3 degrees F Pulse rate:   102 / minute BP sitting:   131 / 83  Vitals Entered By: Jone Baseman CMA (September 08, 2009 10:55 AM) CC: new patient Is Patient Diabetic? No Pain Assessment Patient in pain? no        Primary Care Provider:  Romero Belling MD  CC:  new patient.  History of Present Illness: 35 yo female presenting to establish care.  Just moved from [place], [place].  Former PCP was:   Pura Spice, MD   [facility]   [address]   [place], [place]  [address]   Phone:  [phone]   FAX:  [phone]  VAGINAL DISCHARGE AND ODOR.  Duration 1 week.  Malodorous and pruritic.  Thick.  ANXIETY DISORDER WITH PANIC ATTACKS.  Mood much improved with Effexor--has been cutting back Effexor from 225 to 75.  Occasionally stops taking for a few days, but notices gets angry easily when she does.  Tries to take Clonazepam 1 tab daily, occasinoally two times a day.  CONTRACEPTION.  On Yas, but also for PMS.  Sexually active with 1 partner, no condoms.  Does not want any more children.  Discussed IUD and she would like one.  OBESITY.  BMI 34.  Now working out 3 days per week.  PREVENTION.  Due for Pap.  Mammogram not indicated until age 89.  Currently fasting, will check FLP.  Habits & Providers  Alcohol-Tobacco-Diet     Alcohol drinks/day: 0     Tobacco Status: never  Exercise-Depression-Behavior     STD Risk: never     Drug Use: never  Current Medications (verified): 1)  Effexor Xr 75 Mg Xr24h-Cap (Venlafaxine Hcl) .Marland Kitchen.. 1 Tab By Mouth Daily 2)  Clonazepam 2 Mg Tabs (Clonazepam) .Marland Kitchen.. 1 Tab By Mouth One-Two Times A Day As Needed Anxiety 3)  Yaz 3-0.02 Mg Tabs (Drospirenone-Ethinyl Estradiol) .Marland Kitchen.. 1 Tab By Mouth Daily.  Disp #1 Pack Daily.  Allergies  (verified): No Known Drug Allergies  Past History:  Past Medical History: [id number] -- NSVD x1 2003, SAB x2 2000 and 2001, TAB 2004 for ectopic pregnancy.  Past Surgical History: TAB (D&C) 2004 for ectopic pregnancy.  Family History: Mother alive:  HLD, HTN, glucose intolerance Father alive:  lung cancer Brother alive and healthy. Sister #1 alive:  HTN Sister #2 alive and healthy. Sister #3 alive and healthy. Son (born 2003)  alive and healthy. Maternal Grandmother:  died from MI at age 49. No breast cancer in mother or sisters.  Social HistoryManufacturing engineer.  Lives with son Cherine Drumgoole dob 10/08/2001) and mother Larissa Pegg dob [birth year]).  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  Never used tobacco.  No EtOH or illicit drugs.  Moved from [place], [place], to [place] in August 2010.Smoking Status:  never Drug Use:  never STD Risk:  never  Review of Systems       The following systems reviewed: constitutional, ENT, cardiovascular, respiratory, GI, GU, MSK, skin/hair, neuro, psychiatric, endocrine, allergic/immunologic. Positive for depression anxiety, otherwise negative.  Physical Exam  Genitalia:  Normal introitus for age, no external lesions, small amount thin white vaginal discharge, mucosa pink and moist, no vaginal  or cervical lesions, no vaginal atrophy, no friaility or hemorrhage, normal uterus size and position, no adnexal masses or tenderness Additional Exam:  VITALS:  Reviewed, normal GEN: Alert & oriented, no acute distress NECK: Midline trachea, no masses/thyromegaly, no cervical lymphadenopathy CARDIO: Regular rate and rhythm, no murmurs/rubs/gallops, 2+ bilateral radial pulses RESP: Clear to auscultation, normal work of breathing, no retractions/accessory muscle use ABD: Normoactive bowel sounds, nontender, no masses/hepatosplenomegaly EXT: Nontender, no edema SKIN: Intact, no lesions HEAD:  Normocephalic, atraumatic. EYES:  No corneal or  conjunctival inflammation noted. EOMI. PERRLA.  Vision grossly normal. EARS:  External ear without significant lesions or deformities.  Clear canals, TM intact bilaterally without bulging, retraction, inflammation or discharge. Hearing grossly normal bilaterally. NOSE:  Nasal mucosa are pink and moist without lesions or exudates. MOUTH:  Oral mucosa and oropharynx without lesions or exudates.    Impression & Recommendations:  Problem # 1:  VAGINAL DISCHARGE (ICD-623.5) Assessment New BV -- metronidazole 500 mg by mouth two times a day x7 days. Orders: [facility] ([address]11)  Problem # 2:  ANXIETY DISORDER (ICD-300.00) Assessment: Improved Stable.  Discussed need to take Effexor regularly to avoid discontinuation syndrome. Her updated medication list for this problem includes:    Effexor Xr 75 Mg Xr24h-cap (Venlafaxine hcl) .Marland Kitchen... 1 tab by mouth daily    Clonazepam 2 Mg Tabs (Clonazepam) .Marland Kitchen... 1 tab by mouth one-two times a day as needed anxiety  Problem # 3:  OBESITY (ICD-278.00) Assessment: Unchanged Trying to work out more. Orders: Comp Met-[facility] [phone]) Lipid-[facility] ([id number])  Problem # 4:  CONTRACEPTIVE MANAGEMENT (ICD-V25.09) Assessment: Unchanged Desires IUD.  Upreg negative in today.  RTC in 2 weeks. Orders: U Preg-[facility] ([address])  Problem # 5:  Preventive Health Care (ICD-V70.0) Pap today.  Flu shot today.  Last Tdap 2008.  Complete Medication List: 1)  Effexor Xr 75 Mg Xr24h-cap (Venlafaxine hcl) .Marland Kitchen.. 1 tab by mouth daily 2)  Clonazepam 2 Mg Tabs (Clonazepam) .Marland Kitchen.. 1 tab by mouth one-two times a day as needed anxiety 3)  Yaz 3-0.02 Mg Tabs (Drospirenone-ethinyl estradiol) .Marland Kitchen.. 1 tab by mouth daily.  disp #1 pack daily. 4)  Metronidazole 500 Mg Tabs (Metronidazole) .Marland Kitchen.. 1 tab by mouth two times a day x7 days  Other Orders: Flu Vaccine 65yrs + (28413) Admin 1st Vaccine (24401) Admin 1st Vaccine [facility]) [phone])  Patient Instructions: 1)  Pleasure to meet you. 2)   Please schedule a follow-up appointment in 2 weeks for IUD placement. Prescriptions: METRONIDAZOLE 500 MG TABS (METRONIDAZOLE) 1 tab by mouth two times a day x7 days  #14 x 0   Entered and Authorized by:   Romero Belling MD   Signed by:   Romero Belling MD on 09/08/2009   Method used:   Print then Give to Patient   RxID:   [id number] CLONAZEPAM 2 MG TABS (CLONAZEPAM) 1 tab by mouth one-two times a day as needed anxiety  #60 x 3   Entered and Authorized by:   Romero Belling MD   Signed by:   Romero Belling MD on 09/08/2009   Method used:   Print then Give to Patient   RxID:   [id number] CLONAZEPAM 2 MG TABS (CLONAZEPAM) 1 tab by mouth one-two times a day as needed anxiety  #60 x 3   Entered and Authorized by:   Romero Belling MD   Signed by:   Romero Belling MD on 09/08/2009   Method used:   Print then Give to Patient   RxID:  [id number] YAZ 3-0.02 MG TABS (DROSPIRENONE-ETHINYL ESTRADIOL) 1 tab by mouth daily.  Disp #1 pack daily.  #1 x 11   Entered and Authorized by:   Romero Belling MD   Signed by:   Romero Belling MD on 09/08/2009   Method used:   Electronically to        [facility] [phone]* (retail)       [address]       [place], [place]  [address]       Ph: [phone]       Fax: [phone]   RxID:   [id number] EFFEXOR XR 75 MG XR24H-CAP (VENLAFAXINE HCL) 1 tab by mouth daily  #30 x 11   Entered and Authorized by:   Romero Belling MD   Signed by:   Romero Belling MD on 09/08/2009   Method used:   Electronically to        [facility]. [phone]* (retail)       [address]       [place], [place]  [address]       Ph: [phone]       Fax: [phone]   RxID:   [id number]    Prevention & Chronic Care Immunizations   Influenza vaccine: Fluvax 3+  (09/08/2009)    Tetanus booster: Not documented   Tetanus booster due: 09/04/2016    Pneumococcal vaccine: Not documented  Other  Screening   Pap smear: Not documented   Smoking status: never  (09/08/2009)   Nursing Instructions: Give Flu vaccine today     Influenza Vaccine    Vaccine Type: Fluvax 3+    Site: right deltoid    Mfr: GlaxoSmithKline    Dose: 0.5 ml    Route: IM    Given by: Garen Grams LPN    Exp. Date: 03/03/2010    Lot #: [id number]    VIS given: 03/28/07 version given September 08, 2009.  Flu Vaccine Consent Questions    Do you have a history of severe allergic reactions to this vaccine? no    Any prior history of allergic reactions to egg and/or gelatin? no    Do you have a sensitivity to the preservative Thimersol? no    Do you have a past history of Guillan-Barre Syndrome? no    Do you currently have an acute febrile illness? no    Have you ever had a severe reaction to latex? no    Vaccine information given and explained to patient? yes    Are you currently pregnant? no  Laboratory Results   Urine Tests  Date/Time Received: September 08, 2009 11:48 AM  Date/Time Reported: September 08, 2009 12:04 PM     Urine HCG: negative Comments: ...........test performed by...........Marland KitchenTerese Door, CMA  Date/Time Received: September 08, 2009 11:48 AM  Date/Time Reported: September 08, 2009 12:04 PM   [employer] Source: vaginal WBC/hpf: 5-10 Bacteria/hpf: 3+  Cocci Clue cells/hpf: few  Positive whiff Yeast/hpf: none Trichomonas/hpf: none Comments: rare sperm present ...........test performed by...........Marland KitchenTerese Door, CMA      Appended Document: NP,tcb Metronidzole Rx printed by mistake--will send electronically. Second Clonazepam Rx printed by mistake--discarged.Prescriptions: METRONIDAZOLE 500 MG TABS (METRONIDAZOLE) 1 tab by mouth two times a day x7 days  #14 x 0   Entered and Authorized by:   Romero Belling MD   Signed by:  Romero Belling MD on 09/08/2009   Method used:   Electronically to        [facility]. [phone]* (retail)       [address]        [place], [place]  [address]       Ph: [phone]       Fax: [phone]   RxID:   [id number]

## 2010-10-04 NOTE — Letter (Signed)
Summary: CMet wnl  [facility]  [address]   [place], [place] [address]   Phone: [phone]  Fax: [phone]    09/23/2009  [facility] [address] [place], [place]  [address]  Dear [name],  I am happy to tell you that on re-check, your liver function tests were completely normal.  There is no need to follow up on this any more.  Sincerely, Romero Belling MD  Appended Document: CMet wnl mailed.

## 2010-10-04 NOTE — Progress Notes (Signed)
Summary: Ultrasound  Phone Note Call from Patient Call back at Home Phone [phone]   Reason for Call: Talk to Nurse Summary of Call: pt lost info to have ultrasound done Initial call taken by: Knox Royalty,  November 19, 2009 11:12 AM  Follow-up for Phone Call        patient informed of appt at [facility] at 3 on 3/21 Follow-up by: Garen Grams LPN,  November 19, 2009 11:17 AM

## 2010-10-04 NOTE — Miscellaneous (Signed)
  Clinical Lists Changes  Medications: Changed medication from CLINDAMYCIN PHOS-BENZOYL PEROX 1-5 % GEL (CLINDAMYCIN PHOS-BENZOYL PEROX) Apply small amount to affected area two times a day for acne.  disp qs to BENZACLIN 1-5 % GEL (CLINDAMYCIN PHOS-BENZOYL PEROX) Apply small amount to affected area two times a day for acne.  disp qs - Signed Rx of BENZACLIN 1-5 % GEL (CLINDAMYCIN PHOS-BENZOYL PEROX) Apply small amount to affected area two times a day for acne.  disp qs;  #1 x 2;  Signed;  Entered by: Romero Belling MD;  Authorized by: Romero Belling MD;  Method used: Electronically to [facility] [phone]*, [address] [place], [place], [place]  [address], Ph: [phone], Fax: [phone]    Prescriptions: BENZACLIN 1-5 % GEL (CLINDAMYCIN PHOS-BENZOYL PEROX) Apply small amount to affected area two times a day for acne.  disp qs  #1 x 2   Entered and Authorized by:   Romero Belling MD   Signed by:   Romero Belling MD on 11/16/2009   Method used:   Electronically to        [facility] [phone]* (retail)       [address]       [place], [place]  [address]       Ph: [phone]       Fax: [phone]   RxID:   [id number]

## 2010-10-04 NOTE — Assessment & Plan Note (Signed)
Summary: vag prob,df   Vital Signs:  Patient profile:   35 year old female Height:      64.5 inches Weight:      232 pounds BMI:     39.35 Temp:     98.3 degrees F oral Pulse rate:   91 / minute BP sitting:   126 / 78  (left arm) Cuff size:   regular  Vitals Entered By: Garen Grams LPN (March 14, 2010 11:34 AM) CC: vaginal discharge Is Patient Diabetic? No Pain Assessment Patient in pain? no        Primary Care Provider:  . [facility]  CC:  vaginal discharge.  History of Present Illness: vaginal discharge: present now for a few days.  worsening.  hasn't tried anything OTC to help.  also having some irritation outside of the vaginal area and some odor.  she has no concern for STD.  her periods are irregular due to her IUD.   Habits & Providers  Alcohol-Tobacco-Diet     Tobacco Status: never  Current Medications (verified): 1)  Clonazepam 1 Mg Tabs (Clonazepam) .Marland Kitchen.. 1 Tab By Mouth Three Times A Day As Needed For Anxiety 2)  Hydroxyzine Pamoate 25 Mg Caps (Hydroxyzine Pamoate) .Marland Kitchen.. 1 Tab By Mouth Every 4 Hours As Needed For Anxiety 3)  Abilify 10 Mg Tabs (Aripiprazole) .Marland Kitchen.. 1 Tab By Mouth Every Morning 4)  Oxcarbazepine 300 Mg Tabs (Oxcarbazepine) .... 2 Tab By Mouth Two Times A Day in The Morning and At Bedtime 5)  Fluoxetine Hcl 40 Mg Caps (Fluoxetine Hcl) .Marland Kitchen.. 1 Cap By Mouth Every Morning 6)  Amitriptyline Hcl 100 Mg Tabs (Amitriptyline Hcl) .Marland Kitchen.. 1 By Mouth At Bedtime As Needed Insomnia 7)  Topamax 100 Mg Tabs (Topiramate) .... 2 By Mouth At Bedtime  Allergies (verified): 1)  ! Seroquel (Quetiapine Fumarate) 2)  ! Geodon (Ziprasidone Hcl)  Past History:  Past medical, surgical, family and social histories (including risk factors) reviewed for relevance to current acute and chronic problems.  Past Medical History: Reviewed history from 03/10/2010 and no changes required. [id number] -- NSVD x1 2003, SAB x2 2000 and 2001, TAB 2004 for ectopic pregnancy. 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 TAB (D&C) 2004 for ectopic pregnancy.  Family History: Reviewed history from 09/08/2009 and no changes required. Mother alive:  HLD, HTN, glucose intolerance Father alive:  lung cancer Brother alive and healthy. Sister #1 alive:  HTN Sister #2 alive and healthy. Sister #3 alive and healthy. Son (born 2003)  alive and healthy. Maternal Grandmother:  died from MI at age 44. No breast cancer in mother or sisters.  Social History: Reviewed history from 09/08/2009 and no changes required. High School graduate.  Lives with son Meri Pelot dob 10/08/2001) and mother Catalina Salasar dob 1191).  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  Never used tobacco.  No EtOH or illicit drugs.  Moved from [place], [place], to [place] in August 2010.  Review of Systems       per HPI  Physical Exam  General:  Well-developed,well-nourished,in no acute distress; alert,appropriate and cooperative throughout examination VS noted  Genitalia:  Normal introitus for age, no external lesions, thick white vaginal discharge, mucosa pink and moist, no vaginal lesions. no vaginal atrophy   Impression & Recommendations:  Problem # 1:  VAGINAL DISCHARGE (ICD-623.5) Assessment New appears she  has a candidal vaginitis rx with diflucan, advised monistat for outer irritation f/u if not improving  Complete Medication List: 1)  Clonazepam 1 Mg Tabs (Clonazepam) .Marland Kitchen.. 1 tab by mouth three times a day as needed for anxiety 2)  Hydroxyzine Pamoate 25 Mg Caps (Hydroxyzine pamoate) .Marland Kitchen.. 1 tab by mouth every 4 hours as needed for anxiety 3)  Abilify 10 Mg Tabs (Aripiprazole) .Marland Kitchen.. 1 tab by mouth every morning 4)  Oxcarbazepine 300  Mg Tabs (Oxcarbazepine) .... 2 tab by mouth two times a day in the morning and at bedtime 5)  Fluoxetine Hcl 40 Mg Caps (Fluoxetine hcl) .Marland Kitchen.. 1 cap by mouth every morning 6)  Amitriptyline Hcl 100 Mg Tabs (Amitriptyline hcl) .Marland Kitchen.. 1 by mouth at bedtime as needed insomnia 7)  Topamax 100 Mg Tabs (Topiramate) .... 2 by mouth at bedtime 8)  Fluconazole 150 Mg Tabs (Fluconazole) .Marland Kitchen.. 1 by mouth for yeast infection.  repeat in 1 week  Other Orders: Wet [facility]- [facility] ([address]) [facility]- Est Level  3 ([address]11)  Patient Instructions: 1)  It appears you have a yeast infection. 2)  I have sent a prescription over to [facility] for you. 3)  I would also suggest you get some miconazole (monistat) cream to put on the outside for the irritation.  If it gets worse or doens't get better please let me know. Prescriptions: FLUCONAZOLE 150 MG TABS (FLUCONAZOLE) 1 by mouth for yeast infection.  repeat in 1 week  #2 x 1   Entered and Authorized by:   Ancil Boozer  MD   Signed by:   Ancil Boozer  MD on 03/14/2010   Method used:   Electronically to        [facility] [phone]* (retail)       [address]       [place], [place]  [address]       Ph: [phone]       Fax: [phone]   RxID:   [id number] TOPAMAX 100 MG TABS (TOPIRAMATE) 2 by mouth at bedtime  #60 x 1   Entered and Authorized by:   Ancil Boozer  MD   Signed by:   Ancil Boozer  MD on 03/14/2010   Method used:   Electronically to        [facility]. [phone]* (retail)       [address]       [place], [place]  [address]       Ph: [phone]       Fax: [phone]   RxID:   [id number]   Laboratory Results  Date/Time Received: March 14, 2010 11:46 AM  Date/Time Reported: March 14, 2010 12:02 PM   Wet [place] Source: vaginal WBC/hpf: 10-20 Bacteria/hpf: 3+  Cocci Clue cells/hpf: none  Positive whiff Yeast/hpf: moderate Trichomonas/hpf: none Comments: rod bacteria  also present ...........test performed by...........Marland KitchenTerese Door, CMA

## 2010-10-04 NOTE — Progress Notes (Signed)
Summary: needs letter  Phone Note Call from Patient Call back at Home Phone [phone]   Caller: Patient Summary of Call: pt is applying for disability and wants to know if he can write a letter stating that she needs to be on disability she will pick it up when ready Initial call taken by: De Nurse,  Jan 11, 2010 8:33 AM  Follow-up for Phone Call        I don't do disability evaluations. Follow-up by: Romero Belling MD,  Jan 12, 2010 9:24 AM  Additional Follow-up for Phone Call Additional follow up Details #1::        spoke w/ pt and informed her. Additional Follow-up by: De Nurse,  Jan 14, 2010 4:51 PM

## 2010-10-04 NOTE — Letter (Signed)
Summary: Results Follow-up Letter  [facility]  [address]   [place], [place] [address]   Phone: [phone]  Fax: [phone]    09/10/2009  [address] [place], [place]  [address]  Dear Ms. Kathreen Cosier,   The following are the results of your recent test(s):  Test     Result     Pap Smear    Normal___X____  Not Normal_____       Comments:  Repeat in 1 year.  Sincerely,  Romero Belling MD [facility]           Appended Document: Results Follow-up Letter mailed.

## 2010-10-04 NOTE — Assessment & Plan Note (Signed)
Summary: f/u,df   Vital Signs:  Patient profile:   35 year old female Height:      64.5 inches Weight:      202.9 pounds BMI:     34.41 Temp:     98.2 degrees F oral Pulse rate:   113 / minute BP sitting:   134 / 77  (left arm) Cuff size:   regular  Vitals Entered By: Garen Grams LPN (November 15, 2009 2:51 PM) CC: f/u multiple issues Is Patient Diabetic? No Pain Assessment Patient in pain? no        Primary Care Provider:  Romero Belling MD  CC:  f/u multiple issues.  History of Present Illness: MOOD DISORDER.  Voluntarily hospitalized at [facility] 03/09 - 03/12 and diagnosis changed from Major Depressive Disorder to Bipolar Disorder.  She was started on Lamictal and Seroquel, and has had her Effexor and Klonopin tapered.  She is doing doing better, is still sad, but denies suicidal ideation.  She now has regular follow up with psychiatry.  TOENAIL FUNGUS.  Duration at least 1-2 weeks.  Bilateral yellowing of the great toes without pain.  CHECK IUD STRINGS.  Mirena placed by me on 09/22/2009, and is requesting string check today.  This has been deferred multiple times secondary to depressed mood.  She is not sexually active.  VAGINAL DISCHARGE.  Duration 1 week; thick, white, pruritic, no odor.  ACNE.  Face and upper chest.  Duration several weeks at least.  She asserts that it definitely started prior to taking Lamictal by several weeks.  No pain or drainage.  Was better when she was taking OCPs, which she has stopped since IUD placed.  Acne started after.  Habits & Providers  Alcohol-Tobacco-Diet     Tobacco Status: never  Current Medications (verified): 1)  Effexor Xr 75 Mg Xr24h-Cap (Venlafaxine Hcl) .Marland Kitchen.. 1 Tab By Mouth Daily.  (Being Weaned Off By Psychiatry) Brand Medically Necessary. 2)  Klonopin 1 Mg Tabs (Clonazepam) .Marland Kitchen.. 1 Tab By Mouth Three Times A Day (Being Weaned Off By Psychiatry) 3)  Seroquel 100 Mg Tabs (Quetiapine Fumarate) .Marland Kitchen.. 1 Tab By Mouth At  Bedtime 4)  Lamictal 25 Mg Tabs (Lamotrigine) .Marland Kitchen.. 1 Tab By Mouth Daily 5)  Terbinafine Hcl 250 Mg Tabs (Terbinafine Hcl) .Marland Kitchen.. 1 Tab By Mouth Daily For Toe Fungus 6)  Fluconazole 150 Mg Tabs (Fluconazole) .Marland Kitchen.. 1 Tab By Mouth X1 For Vaginal Yeast Infection 7)  Clindamycin Phos-Benzoyl Perox 1-5 % Gel (Clindamycin Phos-Benzoyl Perox) .... Apply Small Amount To Affected Area Two Times A Day For Acne.  Disp Qs  Allergies (verified): No Known Drug Allergies  Review of Systems       Per HPI.  Physical Exam  General:  Well-developed,well-nourished,in no acute distress; alert,appropriate and cooperative throughout examination Genitalia:  Normal introitus for age, no external lesions, thick white vaginal discharge, mucosa pink and moist, no vaginal or cervical lesions, no vaginal atrophy, no friaility or hemorrhage, normal uterus size and position, no adnexal masses or tenderness.  ** IUD strings not visible at cervical os, and cannot be teased out with cotton tipped swab ** Extremities:  Bilateral yellowing of great toenails c/w onychomycosis. Skin:  Patches of small pustules on face and upper chest c/w acne vulgaris. Psych:  Cognition and judgment appear intact. Alert and cooperative with normal attention span and concentration. No apparent delusions, illusions, hallucinations.  Mildly anxious.   Impression & Recommendations:  Problem # 1:  ACNE VULGARIS (ICD-706.1) Assessment New  Topical treatment.  Consider OCPs again if persistent problem. Her updated medication list for this problem includes:    Clindamycin Phos-benzoyl Perox 1-5 % Gel (Clindamycin phos-benzoyl perox) .Marland Kitchen... Apply small amount to affected area two times a day for acne.  disp qs  Orders: [facility]- Est  Level 4 ([address])  Problem # 2:  VAGINAL DISCHARGE (ICD-623.5) Assessment: New Fluconazole x1. Orders: Wet Prep- [facility] ([address]) [facility]- Est  Level 4 ([address])  Problem # 3:  ONYCHOMYCOSIS, TOENAILS (ICD-110.1) Assessment:  New Terbinafine oral x3 months.  Advised to RTC in 1 month to check LFTs (future order made today). Her updated medication list for this problem includes:    Terbinafine Hcl 250 Mg Tabs (Terbinafine hcl) .Marland Kitchen... 1 tab by mouth daily for toe fungus    Fluconazole 150 Mg Tabs (Fluconazole) .Marland Kitchen... 1 tab by mouth x1 for vaginal yeast infection  Orders: [facility]- Est  Level 4 ([address])Future Orders: [facility] ([id number]) ... 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  Strings could not be found by probing cervical os with swab.  Will order U/S. Orders: Ultrasound (Ultrasound) [facility]- Est  Level 4 ([address])  Problem # 5:  * ELEVATED VIT B12 Assessment: Comment Only Admits to having B12 injections at a weight loss clinic.  Advised there is no need for this, she will stop.  Problem # 6:  BIPOLAR DISORDER UNSPECIFIED (ICD-296.80) Assessment: Improved Followed by psychiatry now.  Complete Medication List: 1)  Effexor Xr 75 Mg Xr24h-cap (Venlafaxine hcl) .Marland Kitchen.. 1 tab by mouth daily.  (being weaned off by psychiatry) brand medically necessary. 2)  Klonopin 1 Mg Tabs (Clonazepam) .Marland Kitchen.. 1 tab by mouth three times a day (being weaned off by psychiatry) 3)  Seroquel 100 Mg Tabs (Quetiapine fumarate) .Marland Kitchen.. 1 tab by mouth at bedtime 4)  Lamictal 25 Mg Tabs (Lamotrigine) .Marland Kitchen.. 1 tab by mouth daily 5)  Terbinafine Hcl 250 Mg Tabs (Terbinafine hcl) .Marland Kitchen.. 1 tab by mouth daily for toe fungus 6)  Fluconazole 150 Mg Tabs (Fluconazole) .Marland Kitchen.. 1 tab by mouth x1 for vaginal yeast infection 7)  Clindamycin Phos-benzoyl Perox 1-5 % Gel (Clindamycin phos-benzoyl perox) .... Apply small amount to affected area two times a day for acne.  disp qs  Patient Instructions: 1)  Acne:  Benzoyl Peroxide + Clindamycin topical two times a day x3 months. 2)  Vaginal yeast infection: Fluconazole x1 dose. 3)  Toe Fungus:  Terbinafine x3  months. 4)  Schedule lab visit in 1 month to check liver function while on Terbinafine. 5)  We need to schedule an ultrasound to check for IUD--we'll call with appointment. 6)  Please schedule a follow-up appointment in 3 months .   Prescriptions: CLINDAMYCIN PHOS-BENZOYL PEROX 1-5 % GEL (CLINDAMYCIN PHOS-BENZOYL PEROX) Apply small amount to affected area two times a day for acne.  disp qs  #1 x 2   Entered and Authorized by:   Romero Belling MD   Signed by:   Romero Belling MD on 11/15/2009   Method used:   Electronically to        [facility] [phone]* (retail)       [address]       [place], [place]  [address]       Ph: [phone]       Fax: [phone]   RxID:   [id number] FLUCONAZOLE 150 MG TABS (  FLUCONAZOLE) 1 tab by mouth x1 for vaginal yeast infection  #1 x 0   Entered and Authorized by:   Romero Belling MD   Signed by:   Romero Belling MD on 11/15/2009   Method used:   Electronically to        [facility]. [phone]* (retail)       [address]       [place], [place]  [address]       Ph: [phone]       Fax: [phone]   RxID:   [id number] TERBINAFINE HCL 250 MG TABS (TERBINAFINE HCL) 1 tab by mouth daily for toe fungus  #30 x 2   Entered and Authorized by:   Romero Belling MD   Signed by:   Romero Belling MD on 11/15/2009   Method used:   Electronically to        [facility]. [phone]* (retail)       [address]       [place], [place]  [address]       Ph: [phone]       Fax: [phone]   RxID:   [id number]   Laboratory Results  Date/Time Received: November 15, 2009 3:55 PM  Date/Time Reported: November 15, 2009 4:02 PM   Wet [place] Source: vaginal WBC/hpf: 1-5 Bacteria/hpf: 2+  Rods Clue cells/hpf: none  Negative whiff Yeast/hpf: moderate Trichomonas/hpf: none Comments: ...........test performed by...........Marland KitchenTerese Door, CMA

## 2010-10-04 NOTE — Assessment & Plan Note (Signed)
Summary: ck on meds,df   Vital Signs:  Patient profile:   35 year old female Height:      64.5 inches Weight:      231.2 pounds BMI:     39.21 Temp:     98.5 degrees F oral Pulse rate:   108 / minute BP sitting:   108 / 75  (left arm) Cuff size:   regular  Vitals Entered By: Garen Grams LPN (March 10, 1609 1:42 PM) CC: f/u meds, feet swelling Is Patient Diabetic? No Pain Assessment Patient in pain? no        Primary Care Provider:  . [facility]  CC:  f/u meds and feet swelling.  History of Present Illness: sleep: back to where she would like it to be with the doubling of the dose of amitryptilline to get her to 100mg  by mouth at bedtime.  she is pleased with the results and would like to keep it there.  she feels rested.  swelling: has resolved with discontinuing her remeron.  no longer having pain in addition as she was before either.  reviewed labs with her that were normal.   Habits & Providers  Alcohol-Tobacco-Diet     Tobacco Status: never  Current Medications (verified): 1)  Clonazepam 1 Mg Tabs (Clonazepam) .Marland Kitchen.. 1 Tab By Mouth Three Times A Day As Needed For Anxiety 2)  Hydroxyzine Pamoate 25 Mg Caps (Hydroxyzine Pamoate) .Marland Kitchen.. 1 Tab By Mouth Every 4 Hours As Needed For Anxiety 3)  Abilify 10 Mg Tabs (Aripiprazole) .Marland Kitchen.. 1 Tab By Mouth Every Morning 4)  Oxcarbazepine 300 Mg Tabs (Oxcarbazepine) .... 2 Tab By Mouth Two Times A Day in The Morning and At Bedtime 5)  Fluoxetine Hcl 40 Mg Caps (Fluoxetine Hcl) .Marland Kitchen.. 1 Cap By Mouth Every Morning 6)  Amitriptyline Hcl 100 Mg Tabs (Amitriptyline Hcl) .Marland Kitchen.. 1 By Mouth At Bedtime As Needed Insomnia  Allergies (verified): 1)  ! Seroquel (Quetiapine Fumarate) 2)  ! Geodon (Ziprasidone Hcl)  Past History:  Past medical, surgical, family and social histories (including risk factors) reviewed for relevance to current acute and chronic problems.  Past Medical History: [id number] -- NSVD x1 2003, SAB x2 2000 and 2001, TAB 2004  for ectopic pregnancy. 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 TAB (D&C) 2004 for ectopic pregnancy.  Family History: Reviewed history from 09/08/2009 and no changes required. Mother alive:  HLD, HTN, glucose intolerance Father alive:  lung cancer Brother alive and healthy. Sister #1 alive:  HTN Sister #2 alive and healthy. Sister #3 alive and healthy. Son (born 2003)  alive and healthy. Maternal Grandmother:  died from MI at age 25. No breast cancer in mother or sisters.  Social History: Reviewed history from 09/08/2009 and no changes required. High School graduate.  Lives with son Joeann Steppe dob 10/08/2001) and mother Joanell Cressler dob [birth year]).  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  Never used tobacco.  No EtOH or illicit drugs.  Moved from [place], [place], to [place] in August 2010.  Review of Systems       per HPI  Physical Exam  General:  Well-developed,well-nourished,in no acute distress; alert,appropriate and cooperative throughout examination VS noted - some weight loss Extremities:  bilateral lower extremities with tr pitting edema.  no erythema or warmth.  edema is symmetric.    Impression & Recommendations:  Problem # 1:  PERIPHERAL EDEMA (ICD-782.3) Assessment Improved  resolved.  appears to have been a medication side effect.  she is to call if it returns again again, labs WNL  Orders: [facility]- Est Level  3 ([address])  Problem # 2:  INSOMNIA, CHRONIC (ICD-307.42) Assessment: Improved  doing well at 100mg  dose of amitryptilline.  refilled this today.  f/u if not going well  Orders: [facility]- Est Level  3 ([address])  Complete Medication List: 1)  Clonazepam 1 Mg Tabs (Clonazepam) .Marland Kitchen.. 1 tab by mouth  three times a day as needed for anxiety 2)  Hydroxyzine Pamoate 25 Mg Caps (Hydroxyzine pamoate) .Marland Kitchen.. 1 tab by mouth every 4 hours as needed for anxiety 3)  Abilify 10 Mg Tabs (Aripiprazole) .Marland Kitchen.. 1 tab by mouth every morning 4)  Oxcarbazepine 300 Mg Tabs (Oxcarbazepine) .... 2 tab by mouth two times a day in the morning and at bedtime 5)  Fluoxetine Hcl 40 Mg Caps (Fluoxetine hcl) .Marland Kitchen.. 1 cap by mouth every morning 6)  Amitriptyline Hcl 100 Mg Tabs (Amitriptyline hcl) .Marland Kitchen.. 1 by mouth at bedtime as needed insomnia  Patient Instructions: 1)  Please let us know if your symptoms worsen or your meds quit working.   Prescriptions: AMITRIPTYLINE HCL 100 MG TABS (AMITRIPTYLINE HCL) 1 by mouth at bedtime as needed insomnia  #30 x 6   Entered and Authorized by:   Ancil Boozer  MD   Signed by:   Ancil Boozer  MD on 03/10/2010   Method used:   Electronically to        [facility] [phone]* (retail)       [address]       [place], [place]  [address]       Ph: [phone]       Fax: [phone]   RxID:   [id number]

## 2010-10-04 NOTE — Progress Notes (Signed)
Summary: Ref [facility]  Phone Note Call from Patient Call back at [facility] Phone [phone]   Caller: Patient Summary of Call: Pt checking on referral for ultrasound and dermatologist referral. Initial call taken by: Clydell Hakim,  November 16, 2009 1:54 PM  Follow-up for Phone Call        will forward to MD. Follow-up by: Theresia Lo RN,  November 16, 2009 1:57 PM  Additional Follow-up for Phone Call Additional follow up Details #1::        Informed pt of u/s on 11/22/09. No order for derm., ok to refer? Additional Follow-up by: Garen Grams LPN,  November 16, 2009 3:23 PM  New Problems: IUD MALFUNCTION (UJW-119.14)   Additional Follow-up for Phone Call Additional follow up Details #2::    Please refer to dermatology for acne. My oversight. Order entered today. Follow-up by: Romero Belling MD,  November 16, 2009 7:44 PM  New Problems: IUD MALFUNCTION [phone]) noted and will schedule. Theresia Lo RN  November 17, 2009 10:28 AM

## 2010-10-06 NOTE — Progress Notes (Signed)
Summary: ROI  ROI   Imported By: De Nurse 09/15/2010 16:46:33  _____________________________________________________________________  External Attachment:    Type:   Image     Comment:   External Document

## 2010-11-28 LAB — BASIC METABOLIC PANEL
BUN: 4 mg/dL — ABNORMAL LOW (ref 6–23)
CO2: 30 mEq/L (ref 19–32)
Chloride: 101 mEq/L (ref 96–112)
Creatinine, Ser: 0.83 mg/dL (ref 0.4–1.2)
Potassium: 3.8 mEq/L (ref 3.5–5.1)

## 2010-11-28 LAB — URINE MICROSCOPIC-ADD ON

## 2010-11-28 LAB — DIFFERENTIAL
Eosinophils Absolute: 0.1 10*3/uL (ref 0.0–0.7)
Eosinophils Relative: 1 % (ref 0–5)
Monocytes Relative: 9 % (ref 3–12)
Neutro Abs: 2.7 10*3/uL (ref 1.7–7.7)
Neutrophils Relative %: 46 % (ref 43–77)

## 2010-11-28 LAB — CBC
HCT: 38.8 % (ref 36.0–46.0)
Hemoglobin: 13.4 g/dL (ref 12.0–15.0)
MCHC: 34.6 g/dL (ref 30.0–36.0)
Platelets: 316 10*3/uL (ref 150–400)
RBC: 4.24 MIL/uL (ref 3.87–5.11)
WBC: 5.9 10*3/uL (ref 4.0–10.5)

## 2010-11-28 LAB — POCT PREGNANCY, URINE: Preg Test, Ur: NEGATIVE

## 2010-11-28 LAB — HEPATIC FUNCTION PANEL
AST: 23 U/L (ref 0–37)
Bilirubin, Direct: <0.1 mg/dL (ref 0.0–0.3)
Total Bilirubin: 0.4 mg/dL (ref 0.3–1.2)

## 2010-11-28 LAB — URINALYSIS, ROUTINE W REFLEX MICROSCOPIC
Protein, ur: NEGATIVE mg/dL
Specific Gravity, Urine: 1.009 (ref 1.005–1.030)
pH: 7.5 (ref 5.0–8.0)

## 2010-11-28 LAB — RAPID URINE DRUG SCREEN, HOSP PERFORMED
Benzodiazepines: POSITIVE — AB
Cocaine: NOT DETECTED
Tetrahydrocannabinol: NOT DETECTED

## 2010-11-28 LAB — ETHANOL: Alcohol, Ethyl (B): <5 mg/dL (ref 0–10)

## 2010-12-08 LAB — URINE MICROSCOPIC-ADD ON

## 2010-12-08 LAB — URINALYSIS, ROUTINE W REFLEX MICROSCOPIC
Protein, ur: NEGATIVE mg/dL
Specific Gravity, Urine: 1.012 (ref 1.005–1.030)
pH: 7 (ref 5.0–8.0)

## 2010-12-08 LAB — POCT PREGNANCY, URINE: Preg Test, Ur: NEGATIVE

## 2010-12-08 LAB — WET PREP, GENITAL

## 2012-10-16 NOTE — ED Notes (Signed)
Patient explained to why she had to be evaluated she does not meet the criteria for the fast track. Patient states she does not understand and states she was going to leave. Then decided to stay

## 2012-10-16 NOTE — ED Notes (Signed)
Patient states she is bipolar and she has not slept in 9 days. State she has not taken her medications she does not have an appointment tomorrow. I talked to [place] last night they wanted to put in a partial program but patient refused.

## 2012-10-16 NOTE — ED Notes (Signed)
Patient did not answer when called on three times

## 2012-10-18 NOTE — ED Notes (Signed)
Formatting of this note might be different from the original.  Pt denies thoughts of harming self or other, pt reports not sleeping in 10 days, reports having this problem before, pt appears sleepy but able to answer questions appropriately. Pt denies thoughts of harming self or others. Pt reports out of multiple medication due to leaving current psychiatrist. Pt is calm and cooperative, positioned to comfort, denies needs at this time, will continue to monitor.   Electronically signed by Brantley Persons L at 10/18/2012  6:28 PM EST

## 2012-10-18 NOTE — ED Notes (Signed)
Formatting of this note might be different from the original.  Patient unable to provide urine specimen, states she is still trying    Electronically signed by Faylene Million, April M at 10/18/2012  7:21 PM EST

## 2012-10-18 NOTE — ED Notes (Signed)
Formatting of this note might be different from the original.  Patient brought into triage and vitals signs rechecked.  Patient with clear speech.  Patient states that she will not answer any questions so that I can put her in a mental hospital.  Patient friends states that she went to the back of the Er to see what was "going on back there" and why the patient was not being taken back.  Informed patient and family member that I would have been happy to answer any questions  Electronically signed by Verna Czech, RN at 10/18/2012  5:44 PM EST

## 2012-10-18 NOTE — ED Notes (Signed)
Formatting of this note might be different from the original.  Patients friend came to RN who was in charge at the time. States that she is concerned that the patient is suicidal and has slurred speech. Explained to family member. RN would have triage nurse reassess patient and in the mean time would make arrangements to get patient to a treatment room and a sitter would be placed at bedside. Patient moved to room 7 RN explained to the family member that we would do everything we could to make sure the patient is safe. Family member at bedside.  Electronically signed by Garfield Cornea A at 10/18/2012  5:58 PM EST

## 2012-10-18 NOTE — ED Notes (Signed)
Formatting of this note might be different from the original.  "I am bipolar and I have not slept in 10 days and my body is aching and I need meds to make me sleep." Patient states that she ran out of her medications last night. Patient states her psychiatrist would not renew any of her medication.   Electronically signed by Verna Czech, RN at 10/18/2012  3:54 PM EST

## 2012-10-18 NOTE — ED Notes (Signed)
Formatting of this note might be different from the original.  Pt attempted to provide urine sample but was unable, provided with water per request.   Electronically signed by Brantley Persons L at 10/18/2012  7:00 PM EST

## 2012-10-18 NOTE — ED Notes (Signed)
Formatting of this note might be different from the original.  I have reviewed discharge instructions with the patient.  The patient verbalized understanding.  Patient armband removed and shredded, scripts x 6 given    Electronically signed by Brantley Persons L at 10/18/2012  8:38 PM EST
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
## 2012-10-18 NOTE — ED Notes (Signed)
Pt attempted to provide urine sample but was unable, provided with water per request.

## 2012-10-18 NOTE — ED Notes (Signed)
Pt denies thoughts of harming self or other, pt reports not sleeping in 10 days, reports having this problem before, pt appears sleepy but able to answer questions appropriately. Pt denies thoughts of harming self or others. Pt reports out of multiple medication due to leaving current psychiatrist. Pt is calm and cooperative, positioned to comfort, denies needs at this time, will continue to monitor.

## 2012-10-18 NOTE — ED Notes (Signed)
Patient brought into triage and vitals signs rechecked.  Patient with clear speech.  Patient states that she will not answer any questions so that I can put her in a mental hospital.  Patient friends states that she went to the back of the Er to see what was "going on back there" and why the patient was not being taken back.  Informed patient and family member that I would have been happy to answer any questions

## 2012-10-18 NOTE — ED Notes (Addendum)
Patients friend came to RN who was in charge at the time. States that she is concerned that the patient is suicidal and has slurred speech. Explained to family member. RN would have triage nurse reassess patient and in the mean time would make arrangements to get patient to a treatment room and a sitter would be placed at bedside. Patient moved to room 7 RN explained to the family member that we would do everything we could to make sure the patient is safe. Family member at bedside.

## 2012-10-18 NOTE — ED Provider Notes (Addendum)
HPI Comments: 6:01 PM  37 y.o. female presents to the ED C/O not being able to sleep for 10 days. Pt normally sleeps about 12 hours a night. PMH includes ADD, PTSD, and bipolar. Pt states she is out of her medications and her psychiatrist "Olegario Messier" would not prescribe her any medications as she was not comfortable due to their falling out 2 days ago. Pt denies being fired from her psychiatrist. Pt is scheduled to have a meet and greet with her new psychiatrist in [place] on 10/29/2012, 1.5 weeks from now. Pt was taking Vyvanse 70 once a day, Adderall 25mg  30 minutes before taking Vyvanse, Clonidine 0.2 mg, Klonopin 0.2mg  twice a day, Prozac 60mg  once a day in the morning, Topamax 100mg  at night, and Dalmane 30mg . Pt reports having a previous admission for sleep disturbance. Pt's PCP is Dr. Cher Nakai which she has not seen yet due to being in the ED during her first appointment with him. Associated sxs include back, arm, leg, and sinus pain 3-4 days. LMP is now. Pt denies suicidal ideations, tobacco use, ETOH use, and any other sxs or complaints    Recorded by Annie Sable, ED Scribe, as dictated by Waldo Laine, PA-C      Patient is a 37 y.o. female presenting with sleep problem. The history is provided by the patient. No language interpreter was used.   Sleep Problem  This is a new problem. The current episode started more than 1 week ago (10 days). The problem occurs constantly. The problem has not changed since onset.Pertinent negatives include no chest pain, no abdominal pain, no headaches and no shortness of breath.        Past Medical History   Diagnosis Date   ??? Bipolar 1 disorder    ??? ADD (attention deficit disorder)    ??? Hypertension         No past surgical history on file.      No family history on file.     History     Social History   ??? Marital Status: SINGLE     Spouse Name: N/A     Number of Children: N/A   ??? Years of Education: N/A     Occupational History   ??? Not on file.     Social History Main  Topics   ??? Smoking status: Never Smoker    ??? Smokeless tobacco: Not on file   ??? Alcohol Use: No   ??? Drug Use: No   ??? Sexually Active: No     Other Topics Concern   ??? Not on file     Social History Narrative   ??? No narrative on file                  ALLERGIES: Geodon      Review of Systems   Constitutional: Negative for fever, activity change, appetite change and unexpected weight change.   HENT: Positive for sinus pressure. Negative for congestion and sore throat.    Respiratory: Negative for shortness of breath.    Cardiovascular: Negative for chest pain.   Gastrointestinal: Negative for nausea, vomiting, abdominal pain and diarrhea.   Genitourinary: Negative for difficulty urinating.   Musculoskeletal: Positive for myalgias, back pain and arthralgias.        BLE, BUE   Skin: Negative for rash.   Neurological: Negative for headaches.   Psychiatric/Behavioral: Positive for sleep disturbance.   All other systems reviewed and are negative.  Filed Vitals:    10/18/12 1550 10/18/12 1741   BP: 91/68 94/57   Pulse: 83 80   Temp: 98.4 ??F (36.9 ??C) 98.4 ??F (36.9 ??C)   Resp: 20 20   Height: 5\' 3"  (1.6 m)    Weight: 77.111 kg (170 lb)    SpO2: 100% 100%            Physical Exam   Nursing note and vitals reviewed.  Constitutional: She is oriented to person, place, and time. She appears well-developed and well-nourished. No distress.   HENT:   Head: Normocephalic and atraumatic.   Eyes: EOM are normal. Pupils are equal, round, and reactive to light. No scleral icterus.   Neck: No JVD present. No tracheal deviation present. No thyromegaly present.   Cardiovascular: Normal rate, regular rhythm, normal heart sounds and intact distal pulses.  Exam reveals no gallop and no friction rub.    No murmur heard.  Pulmonary/Chest: Effort normal and breath sounds normal. No stridor. No respiratory distress. She has no wheezes. She has no rales. She exhibits no tenderness.   Abdominal: Soft. She exhibits no distension and no mass.  There is no tenderness. There is no rebound and no guarding.   Musculoskeletal: She exhibits no edema and no tenderness.   Lymphadenopathy:     She has no cervical adenopathy.   Neurological: She is alert and oriented to person, place, and time. No cranial nerve deficit. She exhibits normal muscle tone. Coordination normal.   Skin: Skin is warm and dry. No rash noted. No erythema. No pallor.   Psychiatric: She has a normal mood and affect. Her behavior is normal. Thought content normal.        MDM     Differential Diagnosis; Clinical Impression; Plan:     Differential: insomnia; myalgia; myositis; rhabdo; dehydration; UTI; depression    One of the nurses believes there was a concern from the family friend that pt was suicidal.  Asked the friend and the patient directly if this was true and pt continues to deny any suicidal ideation and no prior suicidal ideation hospitalization.  Says "I have a child at home to take care of."    Pt with poor judgement and left her psychiatrist's care without plan for medications and now pt without x 3d.  Had a PCP referral for mid-January but with multiple snow storms, pt did not f/up on rescheduling this.  Has psychiatry appt later this month; will write for one week's of meds and can obtain remainder from her PCP with whom I instructed her to contact.  Amount and/or Complexity of Data Reviewed:   Clinical lab tests:  Ordered and reviewed  Discussion of test results with the performing providers:  No   Decide to obtain previous medical records or to obtain history from someone other than the patient:  No   Obtain history from someone other than the patient:  No   Review and summarize past medical records:  No   Discuss the patient with another provider:  No   Independant visualization of image, tracing, or specimen:  No      Procedures    PROGRESS NOTE:   6:19 PM  After initially seeing pt, there was a perceived issue that pt???s close friend stated pt was suicidal. I have  requestioned the pt with friend in room and pt denies suicidal ideation or ever being admitted for suicidal ideation. Pt states she has a son to go home to.  Recorded  by Annie Sable, ED Scribe, as dictated by Waldo Laine, PA-C     Results         Labs Reviewed   CBC WITH AUTOMATED DIFF - Abnormal; Notable for the following:     RDW 15.0 (*)     PLATELET 436 (*)     MPV 8.9 (*)     ABS. LYMPHOCYTES 4.3 (*)     All other components within normal limits   METABOLIC PANEL, COMPREHENSIVE - Abnormal; Notable for the following:     BUN/Creatinine ratio 8 (*)     Bilirubin, total 0.1 (*)     Alk. phosphatase 173 (*)     Albumin 3.3 (*)     Globulin 4.4 (*)     All other components within normal limits   URINALYSIS W/ RFLX MICROSCOPIC - Abnormal; Notable for the following:     Specific gravity >1.030 (*)     Ketone TRACE (*)     Blood LARGE (*)     All other components within normal limits   DRUG SCREEN, URINE - Abnormal; Notable for the following:     BENZODIAZEPINE POSITIVE (*)     AMPHETAMINE POSITIVE (*)     All other components within normal limits   URINE MICROSCOPIC ONLY - Abnormal; Notable for the following:     Bacteria 1+ (*)     All other components within normal limits   HCG URINE, QL       Recent Results (from the past 12 hour(s))   URINALYSIS W/ RFLX MICROSCOPIC    Collection Time     10/18/12  5:56 PM       Result Value Range    Color YELLOW      Appearance HAZY      Specific gravity >1.030 (*) 1.003 - 1.030      pH 5.5  5.0 - 8.0      Protein NEGATIVE   NEGATIVE MG/DL    Glucose NEGATIVE   NEGATIVE MG/DL    Ketone TRACE (*) NEGATIVE MG/DL    Bilirubin NEGATIVE   NEGATIVE    Blood LARGE (*) NEGATIVE    Urobilinogen 0.2  0.2 - 1.0 EU/DL    Nitrites NEGATIVE   NEGATIVE    Leukocyte Esterase NEGATIVE   NEGATIVE   HCG URINE, QL    Collection Time     10/18/12  5:56 PM       Result Value Range    HCG urine, Ql. NEGATIVE   NEGATIVE   DRUG SCREEN, URINE    Collection Time     10/18/12  5:56 PM       Result Value  Range    BENZODIAZEPINE POSITIVE (*) NEGATIVE    BARBITURATES NEGATIVE   NEGATIVE    THC (TH-CANNABINOL) NEGATIVE   NEGATIVE    OPIATES NEGATIVE   NEGATIVE    PCP(PHENCYCLIDINE) NEGATIVE   NEGATIVE    COCAINE NEGATIVE   NEGATIVE    AMPHETAMINE POSITIVE (*) NEGATIVE    METHADONE NEGATIVE   NEGATIVE    HDSCOM (NOTE)     URINE MICROSCOPIC ONLY    Collection Time     10/18/12  5:56 PM       Result Value Range    WBC 3 to 5  0 - 5 /HPF    RBC 3 to 5  0 - 5 /HPF    Epithelial cells 4+  0 - 5 /LPF    Bacteria 1+ (*) NEGATIVE /HPF  CBC WITH AUTOMATED DIFF    Collection Time     10/18/12  6:52 PM       Result Value Range    WBC 8.4  4.6 - 13.2 K/uL    RBC 4.39  4.20 - 5.30 M/uL    HGB 12.8  12.0 - 16.0 g/dL    HCT 16.1  09.6 - 04.5 %    MCV 86.1  74.0 - 97.0 FL    MCH 29.2  24.0 - 34.0 PG    MCHC 33.9  31.0 - 37.0 g/dL    RDW 40.9 (*) 81.1 - 14.5 %    PLATELET 436 (*) 135 - 420 K/uL    MPV 8.9 (*) 9.2 - 11.8 FL    NEUTROPHILS 41  40 - 73 %    LYMPHOCYTES 52  21 - 52 %    MONOCYTES 5  3 - 10 %    EOSINOPHILS 2  0 - 5 %    BASOPHILS 0  0 - 2 %    ABS. NEUTROPHILS 3.4  1.8 - 8.0 K/UL    ABS. LYMPHOCYTES 4.3 (*) 0.9 - 3.6 K/UL    ABS. MONOCYTES 0.4  0.05 - 1.2 K/UL    ABS. EOSINOPHILS 0.2  0.0 - 0.4 K/UL    ABS. BASOPHILS 0.0  0.0 - 0.06 K/UL    DF AUTOMATED     METABOLIC PANEL, COMPREHENSIVE    Collection Time     10/18/12  6:52 PM       Result Value Range    Sodium 142  136 - 145 MMOL/L    Potassium 3.7  3.5 - 5.5 MMOL/L    Chloride 106  100 - 108 MMOL/L    CO2 27  21 - 32 MMOL/L    Anion gap 9  3.0 - 18 mmol/L    Glucose 84  74 - 99 MG/DL    BUN 9  7.0 - 18 MG/DL    Creatinine 9.14  0.6 - 1.3 MG/DL    BUN/Creatinine ratio 8 (*) 12 - 20      GFR est AA >60  >60 ml/min/1.64m2    GFR est non-AA >60  >60 ml/min/1.71m2    Calcium 9.1  8.5 - 10.1 MG/DL    Bilirubin, total 0.1 (*) 0.2 - 1.0 MG/DL    ALT 20  78.2 - 95.6 U/L    AST 15  15 - 37 U/L    Alk. phosphatase 173 (*) 50 - 136 U/L    Protein, total 7.7  6.4 - 8.2 g/dL     Albumin 3.3 (*) 3.4 - 5.0 g/dL    Globulin 4.4 (*) 2.0 - 4.0 g/dL    A-G Ratio 0.8  0.8 - 1.7         8:11 PM   Elanor Bentson's  results have been reviewed with her.  She has been counseled regarding her diagnosis, treatment, and plan.  She verbally conveys understanding and agreement of the signs, symptoms, diagnosis, treatment and prognosis and additionally agrees to follow up as discussed.  She also agrees with the care-plan and conveys that all of her questions have been answered.  I have also provided discharge instructions for her that include: educational information regarding their diagnosis and treatment, and list of reasons why they would want to return to the ED prior to their follow-up appointment, should her condition change.    MEDICATIONS GIVEN:  Medications   zolpidem (AMBIEN) tablet 5 mg (not administered)  CLINICAL IMPRESSION  1. Medication refill    2. Bipolar affective disorder    3. Insomnia    4. Adult ADHD        Recorded by Annie Sable, ED Scribe, as dictated by Waldo Laine, PA-C     MLP ATTESTATION:    I was present in the immediate clinical area during the time of the patient evaluation and available to provide input.  I have reviewed the chart and agree with the documentation recorded by the [facility], including the assessment, treatment plan, and disposition. Any procedures performed, as noted, were under my direction and / or I was present for the key portions.  Tami Ribas, MD

## 2012-10-18 NOTE — ED Notes (Signed)
I have reviewed discharge instructions with the patient.  The patient verbalized understanding.  Patient armband removed and shredded, scripts x 6 given

## 2012-10-18 NOTE — ED Notes (Signed)
"  I am bipolar and I have not slept in 10 days and my body is aching and I need meds to make me sleep." Patient states that she ran out of her medications last night. Patient states her psychiatrist would not renew any of her medication.

## 2012-10-18 NOTE — ED Notes (Signed)
Patient unable to provide urine specimen, states she is still trying

## 2012-10-19 LAB — DRUG SCREEN, URINE
AMPHETAMINES: POSITIVE — AB
BARBITURATES: NEGATIVE
BENZODIAZEPINES: POSITIVE — AB
COCAINE: NEGATIVE
METHADONE: NEGATIVE
OPIATES: NEGATIVE
PCP(PHENCYCLIDINE): NEGATIVE
THC (TH-CANNABINOL): NEGATIVE

## 2012-10-19 LAB — METABOLIC PANEL, COMPREHENSIVE
A-G Ratio: 0.8 (ref 0.8–1.7)
ALT (SGPT): 20 U/L (ref 12.0–78.0)
AST (SGOT): 15 U/L (ref 15–37)
Albumin: 3.3 g/dL — ABNORMAL LOW (ref 3.4–5.0)
Alk. phosphatase: 173 U/L — ABNORMAL HIGH (ref 50–136)
Anion gap: 9 mmol/L (ref 3.0–18)
BUN/Creatinine ratio: 8 — ABNORMAL LOW (ref 12–20)
BUN: 9 MG/DL (ref 7.0–18)
Bilirubin, total: 0.1 MG/DL — ABNORMAL LOW (ref 0.2–1.0)
CO2: 27 MMOL/L (ref 21–32)
Calcium: 9.1 MG/DL (ref 8.5–10.1)
Chloride: 106 MMOL/L (ref 100–108)
Creatinine: 1.08 MG/DL (ref 0.6–1.3)
GFR est AA: >60 mL/min/{1.73_m2} (ref >60)
GFR est non-AA: >60 mL/min/{1.73_m2} (ref >60)
Globulin: 4.4 g/dL — ABNORMAL HIGH (ref 2.0–4.0)
Glucose: 84 MG/DL (ref 74–99)
Potassium: 3.7 MMOL/L (ref 3.5–5.5)
Protein, total: 7.7 g/dL (ref 6.4–8.2)
Sodium: 142 MMOL/L (ref 136–145)

## 2012-10-19 LAB — URINALYSIS W/ RFLX MICROSCOPIC
Bilirubin: NEGATIVE
Glucose: NEGATIVE MG/DL
Leukocyte Esterase: NEGATIVE
Nitrites: NEGATIVE
Protein: NEGATIVE MG/DL
Specific gravity: >1.03 — ABNORMAL HIGH (ref 1.003–1.030)
Urobilinogen: 0.2 EU/DL (ref 0.2–1.0)
pH (UA): 5.5 (ref 5.0–8.0)

## 2012-10-19 LAB — CBC WITH AUTOMATED DIFF
ABS. BASOPHILS: 0 10*3/uL (ref 0.0–0.06)
ABS. EOSINOPHILS: 0.2 10*3/uL (ref 0.0–0.4)
ABS. LYMPHOCYTES: 4.3 10*3/uL — ABNORMAL HIGH (ref 0.9–3.6)
ABS. MONOCYTES: 0.4 10*3/uL (ref 0.05–1.2)
ABS. NEUTROPHILS: 3.4 10*3/uL (ref 1.8–8.0)
BASOPHILS: 0 % (ref 0–2)
EOSINOPHILS: 2 % (ref 0–5)
HCT: 37.8 % (ref 35.0–45.0)
HGB: 12.8 g/dL (ref 12.0–16.0)
LYMPHOCYTES: 52 % (ref 21–52)
MCH: 29.2 PG (ref 24.0–34.0)
MCHC: 33.9 g/dL (ref 31.0–37.0)
MCV: 86.1 FL (ref 74.0–97.0)
MONOCYTES: 5 % (ref 3–10)
MPV: 8.9 FL — ABNORMAL LOW (ref 9.2–11.8)
NEUTROPHILS: 41 % (ref 40–73)
PLATELET: 436 10*3/uL — ABNORMAL HIGH (ref 135–420)
RBC: 4.39 M/uL (ref 4.20–5.30)
RDW: 15 % — ABNORMAL HIGH (ref 11.6–14.5)
WBC: 8.4 10*3/uL (ref 4.6–13.2)

## 2012-10-19 LAB — HCG URINE, QL: HCG urine, QL: NEGATIVE

## 2012-10-19 LAB — URINE MICROSCOPIC ONLY
RBC: 3 /HPF (ref 0–5)
WBC: 3 /HPF (ref 0–5)

## 2013-07-06 ENCOUNTER — Ambulatory Visit (INDEPENDENT_AMBULATORY_CARE_PROVIDER_SITE_OTHER): Payer: Medicare Other | Admitting: Emergency Medicine

## 2013-07-06 VITALS — BP 132/84 | HR 140 | Temp 97.8°F | Resp 20 | Ht 63.75 in | Wt 247.4 lb

## 2013-07-06 DIAGNOSIS — F988 Other specified behavioral and emotional disorders with onset usually occurring in childhood and adolescence: Secondary | ICD-10-CM

## 2013-07-06 DIAGNOSIS — F319 Bipolar disorder, unspecified: Secondary | ICD-10-CM

## 2013-07-06 DIAGNOSIS — F909 Attention-deficit hyperactivity disorder, unspecified type: Secondary | ICD-10-CM | POA: Insufficient documentation

## 2013-07-06 MED ORDER — CLONIDINE HCL 0.2 MG PO TABS: 0.4000 mg | ORAL_TABLET | Freq: Every day | ORAL | Status: DC

## 2013-07-06 MED ORDER — TOPIRAMATE 200 MG PO TABS: 200.0000 mg | ORAL_TABLET | Freq: Every day | ORAL | Status: DC

## 2013-07-06 MED ORDER — AMPHETAMINE-DEXTROAMPHETAMINE 30 MG PO TABS: 30.0000 mg | ORAL_TABLET | Freq: Two times a day (BID) | ORAL | Status: DC

## 2013-07-06 MED ORDER — CLONAZEPAM 1 MG PO TABS: 0.5000 mg | ORAL_TABLET | Freq: Four times a day (QID) | ORAL | Status: DC | PRN

## 2013-07-06 MED ORDER — ESCITALOPRAM OXALATE 20 MG PO TABS: 20.0000 mg | ORAL_TABLET | Freq: Two times a day (BID) | ORAL | Status: DC

## 2013-07-06 MED ORDER — HYDROXYZINE PAMOATE 25 MG PO CAPS: 25.0000 mg | ORAL_CAPSULE | ORAL | Status: DC | PRN

## 2013-07-06 NOTE — Patient Instructions (Signed)
Obtain medical records PRIOR to your next refill.  We will not refill your medications without records.     [facility] Policy for Prescribing Controlled Substances (Revised 07/2012) 1. Prescriptions for controlled substances will be filled by ONE provider at [facility] with whom you have established and developed a plan for your care, including follow-up. 2. You are encouraged to schedule an appointment with your prescriber at our appointment center for follow-up visits whenever possible. 3. If you request a prescription for the controlled substance while at [facility] for an acute problem (with someone other than your regular prescriber), you MAY be given a ONE-TIME prescription for a 30-day supply of the controlled substance, to allow time for you to return to see your regular prescriber for additional prescriptions. 4.    Attention Deficit Hyperactivity Disorder Attention deficit hyperactivity disorder (ADHD) is a problem with behavior issues based on the way the brain functions (neurobehavioral disorder). It is a common reason for behavior and academic problems in school. CAUSES  The cause of ADHD is unknown in most cases. It may run in families. It sometimes can be associated with learning disabilities and other behavioral problems. SYMPTOMS  There are 3 types of ADHD. The 3 types and some of the symptoms include:  Inattentive  Gets bored or distracted easily.  Loses or forgets things. Forgets to hand in homework.  Has trouble organizing or completing tasks.  Difficulty staying on task.  An inability to organize daily tasks and school work.  Leaving projects, chores, or homework unfinished.  Trouble paying attention or responding to details. Careless mistakes.  Difficulty following directions. Often seems like is not listening.  Dislikes activities that require sustained attention (like chores or homework).  Hyperactive-impulsive  Feels like it is impossible to sit still or stay in a seat.  Fidgeting with hands and feet.  Trouble waiting turn.  Talking too much or out of turn. Interruptive.  Speaks or acts impulsively.  Aggressive, disruptive behavior.  Constantly busy or on the go, noisy.  Combined  Has symptoms of both of the above. Often children with ADHD feel discouraged about themselves and with school. They often perform well below their abilities in school. These symptoms can cause problems in home, school, and in relationships with peers. As children get older, the excess motor activities can calm down, but the problems with paying attention and staying organized persist. Most children do not outgrow ADHD but with good treatment can learn to cope with the symptoms. DIAGNOSIS  When ADHD is suspected, the diagnosis should be made by professionals trained in ADHD.  Diagnosis will include:  Ruling out other reasons for the child's behavior.  The caregivers will check with the child's school and check their medical records.  They will talk to teachers and parents.  Behavior rating scales for the child will be filled out by those dealing with the child on a daily basis. A diagnosis is made only after all information has been considered. TREATMENT  Treatment usually includes behavioral treatment often along with medicines. It may include stimulant medicines. The stimulant medicines decrease impulsivity and hyperactivity and increase attention. Other medicines used include antidepressants and certain blood pressure medicines. Most experts agree that treatment for ADHD should address all aspects of the child's functioning. Treatment should not be limited to the use of medicines alone. Treatment should include structured classroom management. The parents must receive education to address rewarding good behavior, discipline, and limit-setting. Tutoring or behavioral therapy or both should be available for  the child. If untreated, the disorder can have long-term serious  effects into adolescence and adulthood. HOME CARE INSTRUCTIONS   Often with ADHD there is a lot of frustration among the family in dealing with the illness. There is often blame and anger that is not warranted. This is a life long illness. There is no way to prevent ADHD. In many cases, because the problem affects the family as a whole, the entire family may need help. A therapist can help the family find better ways to handle the disruptive behaviors and promote change. If the child is young, most of the therapist's work is with the parents. Parents will learn techniques for coping with and improving their child's behavior. Sometimes only the child with the ADHD needs counseling. Your caregivers can help you make these decisions.  Children with ADHD may need help in organizing. Some helpful tips include:  Keep routines the same every day from wake-up time to bedtime. Schedule everything. This includes homework and playtime. This should include outdoor and indoor recreation. Keep the schedule on the refrigerator or a bulletin board where it is frequently seen. Mark schedule changes as far in advance as possible.  Have a place for everything and keep everything in its place. This includes clothing, backpacks, and school supplies.  Encourage writing down assignments and bringing home needed books.  Offer your child a well-balanced diet. Breakfast is especially important for school performance. Children should avoid drinks with caffeine including:  Soft drinks.  Coffee.  Tea.  However, some older children (adolescents) may find these drinks helpful in improving their attention.  Children with ADHD need consistent rules that they can understand and follow. If rules are followed, give small rewards. Children with ADHD often receive, and expect, criticism. Look for good behavior and praise it. Set realistic goals. Give clear instructions. Look for activities that can foster success and self-esteem.  Make time for pleasant activities with your child. Give lots of affection.  Parents are their children's greatest advocates. Learn as much as possible about ADHD. This helps you become a stronger and better advocate for your child. It also helps you educate your child's teachers and instructors if they feel inadequate in these areas. Parent support groups are often helpful. A national group with local chapters is called CHADD (Children and Adults with Attention Deficit Hyperactivity Disorder). PROGNOSIS  There is no cure for ADHD. Children with the disorder seldom outgrow it. Many find adaptive ways to accommodate the ADHD as they mature. SEEK MEDICAL CARE IF:  Your child has repeated muscle twitches, cough or speech outbursts.  Your child has sleep problems.  Your child has a marked loss of appetite.  Your child develops depression.  Your child has new or worsening behavioral problems.  Your child develops dizziness.  Your child has a racing heart.  Your child has stomach pains.  Your child develops headaches. Document Released: 08/11/2002 Document Revised: 11/13/2011 Document Reviewed: 03/23/2008 [facility] Patient Information 2014 [place], [place].

## 2013-07-06 NOTE — Progress Notes (Signed)
[facility] [address] [place] [address] [phone]  Date:  07/06/2013   Name:  Laura Horton   DOB:  16-Dec-1975   MRN:  [id number]  PCP:  No PCP Per Patient    Chief Complaint: Medication Refill   History of Present Illness:  Laura Horton is a 37 y.o. very pleasant female patient who presents with the following:  To office today following a move back to [facility] from [facility].  Needs refill on her medications pending establishment with a regular family medicine physician and psychiatrist.  Tolerating medication and doing well on meds.  Denies other complaint or health concern today.  Patient Active Problem List   Diagnosis Date Noted  . ADD (attention deficit disorder) 07/06/2013  . VAGINAL DISCHARGE 03/14/2010  . INSOMNIA, CHRONIC 03/10/2010  . PERIPHERAL EDEMA 03/01/2010  . 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 PRESENCE OF INTRAUTERINE CONTRACEPTIVE DEVICE 09/22/2009  . OBESITY 09/08/2009  . BIPOLAR DISORDER UNSPECIFIED 09/08/2009    Past Medical History  Diagnosis Date  . Anxiety   . Depression   . Bipolar 1 disorder   . ADHD (attention deficit hyperactivity disorder)     History reviewed. No pertinent past surgical history.  History  Substance Use Topics  . Smoking status: Never Smoker   . Smokeless tobacco: Not on file  . Alcohol Use: No    Family History  Problem Relation Age of Onset  . Heart disease Mother   . Hypertension Mother   . Cancer Father     LUNG  . Mental illness Maternal Grandmother     Allergies  Allergen Reactions  . Quetiapine     REACTION: Weight Gain  . Ziprasidone Mesylate     REACTION: Dyspnea, swollen tongue, neck pain, torticullis    Medication list has been reviewed and updated.  Current Outpatient Prescriptions on File Prior to Visit  Medication Sig Dispense Refill  . clonazePAM (KLONOPIN) 1 MG tablet Take 1  mg by mouth 3 (three) times daily as needed.        . fluconazole (DIFLUCAN) 150 MG tablet Take 150 mg by mouth once. Repeat in one week       . hydrOXYzine (VISTARIL) 25 MG capsule Take 25 mg by mouth every 4 (four) hours as needed.        . topiramate (TOPAMAX) 100 MG tablet Take 200 mg by mouth at bedtime.        Marland Kitchen amitriptyline (ELAVIL) 100 MG tablet Take 100 mg by mouth at bedtime.        . ARIPiprazole (ABILIFY) 10 MG tablet Take 10 mg by mouth every morning.        Marland Kitchen FLUoxetine (PROZAC) 40 MG capsule Take 40 mg by mouth every morning.        . Oxcarbazepine (TRILEPTAL) 300 MG tablet 2 tab by mouth two times a day in the morning and at bedtime        No current facility-administered medications on file prior to visit.    Review of Systems:  As per HPI, otherwise negative.    Physical Examination: Filed Vitals:   07/06/13 0959  BP: 132/84  Pulse: 140  Temp: 97.8 F (36.6 C)  Resp: 20   Filed Vitals:   07/06/13 0959  Height: 5' 3.75" (1.619 m)  Weight: 247 lb 6.4 oz (112.22 kg)   Body mass index is 42.81 kg/(m^2). Ideal Body Weight: Weight  in (lb) to have BMI = 25: 144.2   GEN: obese, NAD, Non-toxic, Alert & Oriented x 3 HEENT: Atraumatic, Normocephalic.  Ears and Nose: No external deformity. EXTR: No clubbing/cyanosis/edema NEURO: Normal gait.  PSYCH: Normally interactive. Conversant. Not depressed or anxious appearing.  Calm demeanor.    Assessment and Plan: ADD  Bipolar disorder Depression  Obesity Refill meds one month pending medical records  Signed,  Phillips Odor, MD

## 2013-09-04 ENCOUNTER — Other Ambulatory Visit: Payer: Self-pay | Admitting: Emergency Medicine

## 2013-10-29 ENCOUNTER — Encounter: Payer: Self-pay | Admitting: Emergency Medicine

## 2013-10-29 ENCOUNTER — Ambulatory Visit (INDEPENDENT_AMBULATORY_CARE_PROVIDER_SITE_OTHER): Payer: Medicare Other | Admitting: Emergency Medicine

## 2013-10-29 VITALS — BP 134/80 | HR 118 | Temp 98.4°F | Ht 65.0 in | Wt 265.0 lb

## 2013-10-29 DIAGNOSIS — F988 Other specified behavioral and emotional disorders with onset usually occurring in childhood and adolescence: Secondary | ICD-10-CM

## 2013-10-29 DIAGNOSIS — E669 Obesity, unspecified: Secondary | ICD-10-CM

## 2013-10-29 DIAGNOSIS — Z0000 Encounter for general adult medical examination without abnormal findings: Secondary | ICD-10-CM

## 2013-10-29 DIAGNOSIS — F319 Bipolar disorder, unspecified: Secondary | ICD-10-CM

## 2013-10-29 DIAGNOSIS — Z975 Presence of (intrauterine) contraceptive device: Secondary | ICD-10-CM

## 2013-10-29 DIAGNOSIS — G4700 Insomnia, unspecified: Secondary | ICD-10-CM

## 2013-10-29 LAB — CBC
HEMATOCRIT: 39.1 % (ref 36.0–46.0)
Hemoglobin: 13.2 g/dL (ref 12.0–15.0)
MCH: 28.6 pg (ref 26.0–34.0)
MCHC: 33.8 g/dL (ref 30.0–36.0)
MCV: 84.6 fL (ref 78.0–100.0)
Platelets: 478 10*3/uL — ABNORMAL HIGH (ref 150–400)
RBC: 4.62 MIL/uL (ref 3.87–5.11)
RDW: 14.3 % (ref 11.5–15.5)
WBC: 6.5 10*3/uL (ref 4.0–10.5)

## 2013-10-29 LAB — COMPLETE METABOLIC PANEL WITH GFR
ALK PHOS: 95 U/L (ref 39–117)
ALT: 31 U/L (ref 0–35)
AST: 21 U/L (ref 0–37)
Albumin: 4.2 g/dL (ref 3.5–5.2)
BUN: 10 mg/dL (ref 6–23)
CO2: 23 mEq/L (ref 19–32)
Calcium: 9.2 mg/dL (ref 8.4–10.5)
Chloride: 107 mEq/L (ref 96–112)
Creat: 0.94 mg/dL (ref 0.50–1.10)
GFR, Est African American: >89 mL/min
GFR, Est Non African American: 78 mL/min
Glucose, Bld: 85 mg/dL (ref 70–99)
Potassium: 3.9 mEq/L (ref 3.5–5.3)
SODIUM: 138 meq/L (ref 135–145)
TOTAL PROTEIN: 7.2 g/dL (ref 6.0–8.3)
Total Bilirubin: 0.2 mg/dL (ref 0.2–1.2)

## 2013-10-29 LAB — LIPID PANEL
CHOL/HDL RATIO: 5.7 ratio
Cholesterol: 201 mg/dL — ABNORMAL HIGH (ref 0–200)
HDL: 35 mg/dL — AB (ref >39)
LDL CALC: 99 mg/dL (ref 0–99)
Triglycerides: 336 mg/dL — ABNORMAL HIGH (ref <150)
VLDL: 67 mg/dL — AB (ref 0–40)

## 2013-10-29 LAB — TSH: TSH: 3.32 u[IU]/mL (ref 0.350–4.500)

## 2013-10-29 MED ORDER — HYDROXYZINE PAMOATE 25 MG PO CAPS
25.0000 mg | ORAL_CAPSULE | ORAL | Status: DC | PRN
Start: 2013-10-29 — End: 2013-10-30

## 2013-10-29 MED ORDER — CLONIDINE HCL 0.2 MG PO TABS: 0.2000 mg | ORAL_TABLET | Freq: Three times a day (TID) | ORAL | Status: DC

## 2013-10-29 MED ORDER — IBUPROFEN 800 MG PO TABS: 800.0000 mg | ORAL_TABLET | Freq: Three times a day (TID) | ORAL | Status: DC | PRN

## 2013-10-29 MED ORDER — ESCITALOPRAM OXALATE 20 MG PO TABS: 20.0000 mg | ORAL_TABLET | Freq: Every day | ORAL | Status: DC

## 2013-10-29 MED ORDER — TOPIRAMATE 200 MG PO TABS: 100.0000 mg | ORAL_TABLET | Freq: Two times a day (BID) | ORAL | Status: DC

## 2013-10-29 NOTE — Progress Notes (Signed)
   Subjective:    Patient ID: Laura Horton, female    DOB: Mar 29, 1976, 38 y.o.   MRN: [id number]  HPI Laura Horton is here for a new patient appt.  She was previously seen her several years ago.  She is re-establishing care after having lived in [place] for a several years.  She does not have any acute concerns today except for medication refills.  She needs refills of all her medications, including Klonopin and Adderall.  She states she has been out of these medicines for the last several weeks.  Without the klonopin, she has been having some trouble sleeping which makes her bipolar more difficult to control.    Current Outpatient Prescriptions on File Prior to Visit  Medication Sig Dispense Refill  . amphetamine-dextroamphetamine (ADDERALL) 30 MG tablet Take 1 tablet (30 mg total) by mouth 2 (two) times daily.  60 tablet  0  . clonazePAM (KLONOPIN) 1 MG tablet Take 0.5 tablets (0.5 mg total) by mouth 4 (four) times daily as needed.  60 tablet  0   No current facility-administered medications on file prior to visit.    I have reviewed and updated the following as appropriate: allergies, current medications, past family history, past medical history, past social history, past surgical history and problem list SHx: non smoker  Health Maintenance: screening labs drawn today; will need pap smear this year   Review of Systems See HPI    Objective:   Physical Exam BP 134/80  Pulse 118  Temp(Src) 98.4 F (36.9 C) (Oral)  Ht 5\' 5"  (1.651 m)  Wt 265 lb (120.203 kg)  BMI 44.10 kg/m2 Gen: alert, cooperative, NAD HEENT: AT/[place], sclera white, MMM Neck: supple, no LAD, no bruits CV: RRR, no murmurs Pulm: CTAB, no wheezes or rales Ext: no edema Neuro: alert, oriented, gait normal     Assessment & Plan:

## 2013-10-29 NOTE — Patient Instructions (Signed)
It was nice to meet you!  Things to do to Keep yourself Healthy - Exercise at least 30-45 minutes a day,  3-4 days a week.  - Eat a low-fat diet with lots of fruits and vegetables, up to 7-9 servings per day. - Seatbelts can save your life. Wear them always. - Smoke detectors on every level of your home, check batteries every year. - Eye Doctor - have an eye exam every 1-2 years - Safe sex - if you may be exposed to STDs, use a condom. - Alcohol If you drink, do it moderately,less than 2 drinks per day. - [place].  Choose someone to speak for you if you are not able. - Depression is common in our stressful world.If you're feeling down or losing interest in things you normally enjoy, please come in for a visit. - Violence - If anyone is threatening or hurting you, please call immediately.  Please make an appointment to see me next week to refill your other medicines.

## 2013-10-29 NOTE — Assessment & Plan Note (Addendum)
Patient is out of medication currently. Refilled clonidine. Discussed that we do not refill controlled substance at the first visit. She will schedule f/u next week.

## 2013-10-29 NOTE — Assessment & Plan Note (Signed)
Worse recently as she has been out of klonopin for the last few weeks.

## 2013-10-29 NOTE — Assessment & Plan Note (Addendum)
Stable on her current regimen of Klonopin, topamax and lexapro.   I have refilled the topamax and lexapro.   She will return next week to refill the Klonopin.

## 2013-10-29 NOTE — Assessment & Plan Note (Signed)
Interested in seeing a nutritionist. Gave her Dr. Jenne Campus' card.

## 2013-10-30 ENCOUNTER — Telehealth: Payer: Self-pay | Admitting: Family Medicine

## 2013-10-30 MED ORDER — HYDROXYZINE PAMOATE 50 MG PO CAPS: 50.0000 mg | ORAL_CAPSULE | Freq: Four times a day (QID) | ORAL | Status: DC | PRN

## 2013-10-30 MED ORDER — TOPIRAMATE 100 MG PO TABS: 100.0000 mg | ORAL_TABLET | Freq: Two times a day (BID) | ORAL | Status: DC

## 2013-10-30 NOTE — Telephone Encounter (Signed)
Patient calling stating that "all of my medications have not been prescribed correctly". I sent in Rx for 100 mg tablets of Topamax and sent in Rx for 50 mg capsules of Vistoril. The remaining medications issues were related to benzos so I advised her to call or be seen tomorrow.

## 2013-10-31 ENCOUNTER — Telehealth: Payer: Self-pay | Admitting: *Deleted

## 2013-10-31 NOTE — Telephone Encounter (Signed)
PA received from [facility] for Hydroxyzine Pamoate 50 mg capsules.  PA placed in provider box for review. Derl Barrow, RN

## 2013-11-04 ENCOUNTER — Encounter: Payer: Self-pay | Admitting: Emergency Medicine

## 2013-11-04 ENCOUNTER — Ambulatory Visit (INDEPENDENT_AMBULATORY_CARE_PROVIDER_SITE_OTHER): Payer: Medicare Other | Admitting: Emergency Medicine

## 2013-11-04 VITALS — BP 119/81 | HR 78 | Temp 97.8°F | Ht 64.0 in | Wt 262.0 lb

## 2013-11-04 DIAGNOSIS — F988 Other specified behavioral and emotional disorders with onset usually occurring in childhood and adolescence: Secondary | ICD-10-CM

## 2013-11-04 DIAGNOSIS — F4001 Agoraphobia with panic disorder: Secondary | ICD-10-CM

## 2013-11-04 DIAGNOSIS — E669 Obesity, unspecified: Secondary | ICD-10-CM

## 2013-11-04 DIAGNOSIS — H538 Other visual disturbances: Secondary | ICD-10-CM

## 2013-11-04 MED ORDER — FLURAZEPAM HCL 30 MG PO CAPS: 30.0000 mg | ORAL_CAPSULE | Freq: Every evening | ORAL | Status: DC | PRN

## 2013-11-04 MED ORDER — CLONAZEPAM 2 MG PO TABS: 2.0000 mg | ORAL_TABLET | Freq: Three times a day (TID) | ORAL | Status: DC | PRN

## 2013-11-04 MED ORDER — AMPHETAMINE-DEXTROAMPHETAMINE 30 MG PO TABS: 30.0000 mg | ORAL_TABLET | Freq: Two times a day (BID) | ORAL | Status: DC

## 2013-11-04 NOTE — Assessment & Plan Note (Signed)
Will restart adderall 30mg  BID as she reports decreased functioning off of it. F/u in 1 month.

## 2013-11-04 NOTE — Progress Notes (Signed)
   Subjective:    Patient ID: Laura Horton, female    DOB: [birth year], 38 y.o.   MRN: [id number]  HPI Laura Horton is here for med refill.  She is here today for refills of klonopin, flurazepam and adderall.  She has been out of these medications for about 1 month.  She states she can tell a difference with her anxiety and ADHD.  She is finding it hard to stay focused and on task.  She has also had increased anxiety and some related palpitations.  She takes the flurazepam at bedtime as needed for sleep.  She states she takes it rarely.  Also reports some blurry vision.  It is bilaterally.  Showed up gradually.  No eye pain.  She does have glasses for driving and has astigmatism in her right eye.  She has not seen an eye doctor in some time.   Current Outpatient Prescriptions on File Prior to Visit  Medication Sig Dispense Refill  . cloNIDine (CATAPRES) 0.2 MG tablet Take 1 tablet (0.2 mg total) by mouth 4 (four) times daily - after meals and at bedtime.  120 tablet  11  . escitalopram (LEXAPRO) 20 MG tablet Take 1 tablet (20 mg total) by mouth daily.  30 tablet  11  . hydrOXYzine (VISTARIL) 50 MG capsule Take 1 capsule (50 mg total) by mouth every 6 (six) hours as needed.  120 capsule  6  . ibuprofen (ADVIL,MOTRIN) 800 MG tablet Take 1 tablet (800 mg total) by mouth every 8 (eight) hours as needed.  30 tablet  3  . topiramate (TOPAMAX) 100 MG tablet Take 1 tablet (100 mg total) by mouth 2 (two) times daily.  60 tablet  3   No current facility-administered medications on file prior to visit.    I have reviewed and updated the following as appropriate: allergies and current medications SHx: non smoker  Review of Systems See HPI    Objective:   Physical Exam BP 119/81  Pulse 78  Temp(Src) 97.8 F (36.6 C) (Oral)  Ht 5\' 4"  (1.626 m)  Wt 262 lb (118.842 kg)  BMI 44.95 kg/m2 Gen: alert, cooperative, NAD, does appear a little anxious.     Assessment & Plan:

## 2013-11-04 NOTE — Telephone Encounter (Signed)
PA form for Hydroxyzine 50 mg tab faxed to [place]. Derl Barrow, RN

## 2013-11-04 NOTE — Patient Instructions (Signed)
It was nice to see you!  Your blood work is all normal.  Follow up in 1 month for medication refills.

## 2013-11-04 NOTE — Telephone Encounter (Signed)
Form completed and returned to [place]. 

## 2013-11-04 NOTE — Assessment & Plan Note (Signed)
She has been on klonopin 2mg  TID previously. Will restart this medication. F/u in 1 month.

## 2013-11-04 NOTE — Assessment & Plan Note (Signed)
Will refer to ophthalmology.

## 2013-11-05 NOTE — Telephone Encounter (Signed)
PA for Hydroxyzine 50 mg cap has been approved for 12 months through 11/05/2014 under pt's medicare part D benefit.  PA reference: [id number]. Pt ID #: [id number].  [facility] pharmacy made aware.  Derl Barrow, RN

## 2013-11-14 ENCOUNTER — Telehealth: Payer: Self-pay | Admitting: Emergency Medicine

## 2013-11-14 NOTE — Telephone Encounter (Signed)
Pt called because she talked to Dr. Bridgett Larsson about mood medications. She thinks she needs this because she is tearful, weepy, and has some feelings of no hope. She would like Dr. Bridgett Larsson to call her at number on file and if no answer please call [phone]. jw

## 2013-11-14 NOTE — Telephone Encounter (Signed)
Spoke with patient and she states her feelings of hopelessness, lack of interest in doing anything, and tearfulness have greatly increased over the past few days.  Pt denies any SI/HI.  I told patient about [place] if she feels she can not control her feelings or if they worsen or if she starts to have SI/HI.  Pt verbalized understanding.  Pt wanted me to let Dr. Bridgett Larsson know that she used to take Vyvanse and Adderall and would like to discuss this medication with MD.  I told pt that I would send Dr.Honig the message and that patient needs to call and make an appt to see Dr. Bridgett Larsson ASAP.  Laura Horton, Laura Horton, Laura Horton

## 2013-11-17 NOTE — Telephone Encounter (Signed)
Patient was restarted on her Adderall on 3/3.  She will need to make an appointment to see me to discuss her mood issues.  Agree with [facility] if mood worsening or develops SI/HI.

## 2013-11-18 MED ORDER — TEMAZEPAM 15 MG PO CAPS: 15.0000 mg | ORAL_CAPSULE | Freq: Every evening | ORAL | Status: DC | PRN

## 2013-11-18 NOTE — Telephone Encounter (Signed)
Will fwd to PCP.  Jaelah Hauth L, CMA  

## 2013-11-18 NOTE — Telephone Encounter (Signed)
Prescription called in to [facility].

## 2013-11-18 NOTE — Telephone Encounter (Signed)
Ms. Potocki called to request a rx for Temazepam instead of the Flurazepam because it's no longer on the insurance forumulary list.  Did get approved for the hydroxyzine(vistaril 50 mg).  Please send to [facility] on [address].  Call Ms. [place]ee if there are any questions regarding request.

## 2013-11-24 ENCOUNTER — Ambulatory Visit: Payer: Medicare Other | Admitting: Family Medicine

## 2013-12-03 ENCOUNTER — Ambulatory Visit: Payer: Medicare Other | Admitting: Emergency Medicine

## 2013-12-04 ENCOUNTER — Ambulatory Visit: Payer: Medicare Other | Admitting: Emergency Medicine

## 2013-12-10 ENCOUNTER — Ambulatory Visit: Payer: Medicare Other | Admitting: Emergency Medicine

## 2013-12-11 ENCOUNTER — Ambulatory Visit: Payer: Medicare Other | Admitting: Emergency Medicine

## 2013-12-11 ENCOUNTER — Ambulatory Visit: Payer: Medicare Other | Admitting: Family Medicine

## 2013-12-15 ENCOUNTER — Ambulatory Visit (INDEPENDENT_AMBULATORY_CARE_PROVIDER_SITE_OTHER): Payer: Medicare Other | Admitting: Emergency Medicine

## 2013-12-15 ENCOUNTER — Encounter: Payer: Self-pay | Admitting: Emergency Medicine

## 2013-12-15 VITALS — BP 118/81 | HR 85 | Temp 98.8°F | Ht 64.0 in | Wt 267.0 lb

## 2013-12-15 DIAGNOSIS — F319 Bipolar disorder, unspecified: Secondary | ICD-10-CM

## 2013-12-15 DIAGNOSIS — F988 Other specified behavioral and emotional disorders with onset usually occurring in childhood and adolescence: Secondary | ICD-10-CM

## 2013-12-15 DIAGNOSIS — Z975 Presence of (intrauterine) contraceptive device: Secondary | ICD-10-CM

## 2013-12-15 MED ORDER — TEMAZEPAM 15 MG PO CAPS: 15.0000 mg | ORAL_CAPSULE | Freq: Every evening | ORAL | Status: DC | PRN

## 2013-12-15 MED ORDER — CLONAZEPAM 2 MG PO TABS: 2.0000 mg | ORAL_TABLET | Freq: Three times a day (TID) | ORAL | Status: DC | PRN

## 2013-12-15 MED ORDER — HYDROXYZINE PAMOATE 50 MG PO CAPS: 50.0000 mg | ORAL_CAPSULE | Freq: Four times a day (QID) | ORAL | Status: DC | PRN

## 2013-12-15 MED ORDER — AMPHETAMINE-DEXTROAMPHETAMINE 30 MG PO TABS: 30.0000 mg | ORAL_TABLET | Freq: Two times a day (BID) | ORAL | Status: DC

## 2013-12-15 MED ORDER — ESCITALOPRAM OXALATE 20 MG PO TABS: 40.0000 mg | ORAL_TABLET | Freq: Every day | ORAL | Status: DC

## 2013-12-15 NOTE — Patient Instructions (Addendum)
It was nice to see you!  I have refilled your medications. We increased your lexapro to 40mg  a day to help with your depression. Go to [email or web address] to look up therapists that take your insurance.  Please reschedule your appointment with Dr. Jenne Campus to discuss nutrition and weight loss.  Follow up with me in 1 month to remove your Mirena and refill medications.

## 2013-12-15 NOTE — Progress Notes (Signed)
   Subjective:    Patient ID: Laura Horton, female    DOB: 1976/07/18, 38 y.o.   MRN: [id number]  HPI Laura Horton is here for f/u mood and medication refill.  Mood States that she is doing better with her medications - klonopin and adderall.  She does report feeling depressed with low energy and crying spells.  No SI/HI.    Family Planning She is engaged and would like to have kids after getting married.  She would like to get her IUD out now.  She is not currently sexually active and does not plan on becoming active until after the wedding.  Current Outpatient Prescriptions on File Prior to Visit  Medication Sig Dispense Refill  . cloNIDine (CATAPRES) 0.2 MG tablet Take 1 tablet (0.2 mg total) by mouth 4 (four) times daily - after meals and at bedtime.  120 tablet  11  . ibuprofen (ADVIL,MOTRIN) 800 MG tablet Take 1 tablet (800 mg total) by mouth every 8 (eight) hours as needed.  30 tablet  3  . topiramate (TOPAMAX) 100 MG tablet Take 1 tablet (100 mg total) by mouth 2 (two) times daily.  60 tablet  3   No current facility-administered medications on file prior to visit.    I have reviewed and updated the following as appropriate: allergies and current medications SHx: non smoker   Review of Systems See HPI    Objective:   Physical Exam BP 118/81  Pulse 85  Temp(Src) 98.8 F (37.1 C) (Oral)  Ht 5\' 4"  (1.626 m)  Wt 267 lb (121.11 kg)  BMI 45.81 kg/m2 Gen: alert, cooperative, NAD, does appear more subdued that at last visit     Assessment & Plan:

## 2013-12-15 NOTE — Assessment & Plan Note (Signed)
Reports continue depressed mood. Will increase lexapro to 40mg  daily. Continue topamax as mood stabilizer. Website to find therapist given. F/u in 1 month.

## 2013-12-15 NOTE — Assessment & Plan Note (Signed)
Patient wants removed. She is engaged and would like to have children in the next year or so. Discussed abstinence or condoms would be necessary between removal of IUD and pregnancy. F/u in 1 month for removal.

## 2013-12-15 NOTE — Assessment & Plan Note (Signed)
Improved on Adderall. Refill for 1 month provided. Discussed going to 3 month supply at next visit.

## 2013-12-17 ENCOUNTER — Telehealth: Payer: Self-pay | Admitting: Emergency Medicine

## 2013-12-17 DIAGNOSIS — J302 Other seasonal allergic rhinitis: Secondary | ICD-10-CM

## 2013-12-17 NOTE — Telephone Encounter (Signed)
Patient seen this week by Dr. Bridgett Larsson and states she forgot to ask if MD could seen in singulair to her pharmacy for her allergies. Will forward to PCP.

## 2013-12-17 NOTE — Telephone Encounter (Signed)
Pt called and would like a nurse to call her at [phone]. jw

## 2013-12-19 DIAGNOSIS — J302 Other seasonal allergic rhinitis: Secondary | ICD-10-CM | POA: Insufficient documentation

## 2013-12-19 MED ORDER — MONTELUKAST SODIUM 10 MG PO TABS: 10.0000 mg | ORAL_TABLET | Freq: Every day | ORAL | Status: DC

## 2013-12-19 NOTE — Telephone Encounter (Signed)
I have sent a prescription for singulair.   We will have to talk about the acne medication as I do not know what medicine she is referring to.  She will need a f/u in May for other medication refills and we can discuss it then, or she can make an appointment sooner if she desires.

## 2013-12-19 NOTE — Telephone Encounter (Signed)
Called pt and informed. Laura Horton

## 2013-12-19 NOTE — Telephone Encounter (Signed)
Pt called to check the status of her request for singulair and also some medication that she was previously taken a body wash for her acne. jw

## 2013-12-19 NOTE — Telephone Encounter (Signed)
Called pt.

## 2013-12-25 ENCOUNTER — Ambulatory Visit: Payer: Medicare Other | Admitting: Emergency Medicine

## 2014-01-07 ENCOUNTER — Telehealth: Payer: Self-pay | Admitting: Emergency Medicine

## 2014-01-07 NOTE — Telephone Encounter (Signed)
Pt complaining of her sinuses and that her meds are not working. Pt notified that our clinic policy is pt needs to be seen in the office. [place]

## 2014-01-07 NOTE — Telephone Encounter (Signed)
Patient states that the Singulair is not working for her. Would like to speak to the nurse/MD about this.

## 2014-01-12 ENCOUNTER — Ambulatory Visit: Payer: Medicare Other | Admitting: Emergency Medicine

## 2014-01-22 ENCOUNTER — Ambulatory Visit: Payer: Medicare Other | Admitting: Emergency Medicine

## 2014-01-29 ENCOUNTER — Ambulatory Visit: Payer: Medicare Other | Admitting: Family Medicine

## 2014-01-29 ENCOUNTER — Ambulatory Visit (INDEPENDENT_AMBULATORY_CARE_PROVIDER_SITE_OTHER): Payer: Medicare Other | Admitting: Emergency Medicine

## 2014-01-29 VITALS — Ht 64.0 in | Wt 271.5 lb

## 2014-01-29 DIAGNOSIS — R110 Nausea: Secondary | ICD-10-CM

## 2014-01-29 DIAGNOSIS — F988 Other specified behavioral and emotional disorders with onset usually occurring in childhood and adolescence: Secondary | ICD-10-CM

## 2014-01-29 DIAGNOSIS — F4001 Agoraphobia with panic disorder: Secondary | ICD-10-CM

## 2014-01-29 DIAGNOSIS — E669 Obesity, unspecified: Secondary | ICD-10-CM

## 2014-01-29 MED ORDER — CETIRIZINE HCL 10 MG PO TABS: 10.0000 mg | ORAL_TABLET | Freq: Every day | ORAL | Status: DC

## 2014-01-29 MED ORDER — AMPHETAMINE-DEXTROAMPHETAMINE 30 MG PO TABS: 30.0000 mg | ORAL_TABLET | Freq: Two times a day (BID) | ORAL | Status: DC

## 2014-01-29 MED ORDER — CLONAZEPAM 2 MG PO TABS: 2.0000 mg | ORAL_TABLET | Freq: Two times a day (BID) | ORAL | Status: DC | PRN

## 2014-01-29 MED ORDER — ONDANSETRON HCL 4 MG PO TABS: 4.0000 mg | ORAL_TABLET | Freq: Three times a day (TID) | ORAL | Status: DC | PRN

## 2014-01-29 NOTE — Assessment & Plan Note (Signed)
She states she has been using less Klonopin as her anxiety has been better. She is agreeable to decreasing the Klonopin to 2 mg twice a day when necessary. Prescription for Klonopin 2 mg twice a day when necessary, #60 provided. Followup in one month.

## 2014-01-29 NOTE — Patient Instructions (Addendum)
Goals: 1. Eat 3 meals a day, including breakfast.  Soup or yogurt is fine for breakfast. 2. Include a vegetable with lunch and dinner. 3. Do x-box zumba or DDR for 30 minutes twice a week (after 3 weeks move to 3 times a week).  Ride bikes with your son at least once during the week.  Make a chart on your phone or tablet that you can keep track of your progress.  Bring this chart with you to your next appointment.    Follow up with Dr. Bridgett Larsson the end of June.

## 2014-01-29 NOTE — Assessment & Plan Note (Signed)
One month of Adderall refilled today. Followup in one month

## 2014-01-29 NOTE — Progress Notes (Signed)
Patient ID: Laura Horton, female   DOB: 1975/11/19, 38 y.o.   MRN: [id number] Medical Nutrition Therapy:   Assessment:  Primary concerns today: lose some weight, get healthy.  Usual eating pattern includes 1 meals and 3-4 snacks per day. Everyday foods/drinks include 4 alo drinks (70 calories each), water, white rice, ramen, progresso soups.   Usual physical activity includes walking around the house.  24-hr recall: Woke up 5am B ( AM)-  1 cup korean coffee with creamer and 2T sugar  Snk (11 AM)-  4T chocolate ice, 2 chips ahoy cookies  L ( PM)-   Snk ( PM)-   D (5 PM)- 2 c. taco salad (hamburger, catalina dressing, lettuce, tomatoes), 1/2 cup of ginger ale, water  Snk (2 AM)- chocolate popsickle    States stomach has been upset last few weeks.  Has stopped the Alos to see if that helps. States has been on Zofran in the past with good results.  Goal(s):   1. Eat 3 meals a day. 2. Include vegetables at lunch and dinner. 3. Get 30 minutes of exercise (using the x-box) 2 times a week (increase to 3 times a week after 3 weeks)  Barriers: low energy, doesn't cook, doesn't have time Ideas: salads to get vegetables in; cooking extra when she has time and freezing it to use later  As one of her major concerns is developing problems with heart disease, cholesterol, high blood pressure, we discussed how regular meals and vegetable intake will reduce this risk. She also has a 10 year old son. His diet is also unhealthy. We discussed with the patient the idea of it including her son in these dietary changes. She does not think that his diet needs to change as he was underweight at his last well-child check. Discussed that weight and nutritional status are 2 separate things.  She appears to be motivated to increase her activity level. However, she does not seem very interested in changing her diet.    Follow Up:  Dietary intake, exercise, and body weight in 1 month(s).   I spent 40  minutes with the patient, > 50% spent in counseling the patient.

## 2014-01-29 NOTE — Assessment & Plan Note (Signed)
Discussed that this is likely related to her poor diet. See body of note for dietary plans. I provided a prescription of Zofran, #20, to use as needed. Followup in one month.

## 2014-02-18 ENCOUNTER — Ambulatory Visit: Payer: Medicare Other | Admitting: Emergency Medicine

## 2014-02-25 ENCOUNTER — Telehealth: Payer: Self-pay | Admitting: Emergency Medicine

## 2014-02-25 MED ORDER — ONDANSETRON HCL 4 MG PO TABS: 4.0000 mg | ORAL_TABLET | Freq: Three times a day (TID) | ORAL | Status: DC | PRN

## 2014-02-25 NOTE — Telephone Encounter (Signed)
I have sent some Zofran (a nausea medicine) to her pharmacy.  It is important for her to keep her appt with me on Friday.

## 2014-02-25 NOTE — Telephone Encounter (Signed)
Patient informed, expressed understanding. Will keep appt on 6/26.

## 2014-02-25 NOTE — Telephone Encounter (Signed)
Is getting sick when eating, stomach is very weak, Has an appt on Friday. Can something be prescribed?

## 2014-02-27 ENCOUNTER — Encounter: Payer: Self-pay | Admitting: Emergency Medicine

## 2014-02-27 ENCOUNTER — Ambulatory Visit (INDEPENDENT_AMBULATORY_CARE_PROVIDER_SITE_OTHER): Payer: Medicare Other | Admitting: Emergency Medicine

## 2014-02-27 VITALS — BP 126/87 | HR 111 | Temp 98.5°F | Ht 64.0 in | Wt 276.0 lb

## 2014-02-27 DIAGNOSIS — F4001 Agoraphobia with panic disorder: Secondary | ICD-10-CM

## 2014-02-27 DIAGNOSIS — M79662 Pain in left lower leg: Secondary | ICD-10-CM | POA: Insufficient documentation

## 2014-02-27 DIAGNOSIS — M79609 Pain in unspecified limb: Secondary | ICD-10-CM

## 2014-02-27 DIAGNOSIS — F988 Other specified behavioral and emotional disorders with onset usually occurring in childhood and adolescence: Secondary | ICD-10-CM

## 2014-02-27 MED ORDER — MONTELUKAST SODIUM 10 MG PO TABS: 10.0000 mg | ORAL_TABLET | Freq: Every day | ORAL | Status: DC

## 2014-02-27 MED ORDER — TRAMADOL HCL 50 MG PO TABS: 50.0000 mg | ORAL_TABLET | Freq: Three times a day (TID) | ORAL | Status: DC | PRN

## 2014-02-27 MED ORDER — CLONAZEPAM 2 MG PO TABS: 2.0000 mg | ORAL_TABLET | Freq: Three times a day (TID) | ORAL | Status: DC | PRN

## 2014-02-27 MED ORDER — OMEPRAZOLE 40 MG PO CPDR: 40.0000 mg | DELAYED_RELEASE_CAPSULE | Freq: Every day | ORAL | Status: DC

## 2014-02-27 MED ORDER — MOMETASONE FUROATE 50 MCG/ACT NA SUSP: 2.0000 | Freq: Every day | NASAL | Status: DC

## 2014-02-27 MED ORDER — AMPHETAMINE-DEXTROAMPHETAMINE 30 MG PO TABS: 30.0000 mg | ORAL_TABLET | Freq: Two times a day (BID) | ORAL | Status: DC

## 2014-02-27 NOTE — Assessment & Plan Note (Signed)
Suspect this is the cause of her nausea and vomiting. Will go back to Klonopin 2mg  TID.  Continue Vistaril as needed. Will also give some omeprazole given her recurrent vomiting.

## 2014-02-27 NOTE — Assessment & Plan Note (Signed)
Suspect shin splints from increased activity with moving. Recommended conservative management with rest, ice, tylenol/ibuprofen. Also recommended a compression sleeve. Tramadol #30 provided for severe pain.

## 2014-02-27 NOTE — Progress Notes (Signed)
   Subjective:    Patient ID: Laura Horton, female    DOB: October 18, 1975, 38 y.o.   MRN: [id number]  HPI Laura Horton is here for medication refill and left leg pain.  Medication refill She takes Klonopin for anxiety.  When I saw her last month, we had decreased her dose to 2mg  BID.  Since then, she reports a lot of "nervous stomach" with nausea and vomiting, almost exclusively associated with anxiety or being upset.  No diarrhea and able to tolerate PO well.  She is also on Adderall for ADHD.  She is moving to [place] next week, and would like a 3 month supply so she has time to find a doctor.  Left leg pain She states that for the last 2 days, her left shin has been aching constantly.  No calf pain or swelling.  No numbness or tingling.  No injury or trauma, but she has been packing and moving boxes the last week.  Current Outpatient Prescriptions on File Prior to Visit  Medication Sig Dispense Refill  . cetirizine (ZYRTEC) 10 MG tablet Take 1 tablet (10 mg total) by mouth daily.  30 tablet  11  . cloNIDine (CATAPRES) 0.2 MG tablet Take 1 tablet (0.2 mg total) by mouth 4 (four) times daily - after meals and at bedtime.  120 tablet  11  . escitalopram (LEXAPRO) 20 MG tablet Take 2 tablets (40 mg total) by mouth daily.  60 tablet  11  . hydrOXYzine (VISTARIL) 50 MG capsule Take 1 capsule (50 mg total) by mouth every 6 (six) hours as needed.  120 capsule  6  . ibuprofen (ADVIL,MOTRIN) 800 MG tablet Take 1 tablet (800 mg total) by mouth every 8 (eight) hours as needed.  30 tablet  3  . ondansetron (ZOFRAN) 4 MG tablet Take 1 tablet (4 mg total) by mouth every 8 (eight) hours as needed for nausea or vomiting.  20 tablet  0  . temazepam (RESTORIL) 15 MG capsule Take 1 capsule (15 mg total) by mouth at bedtime as needed for sleep.  30 capsule  3  . topiramate (TOPAMAX) 100 MG tablet Take 1 tablet (100 mg total) by mouth 2 (two) times daily.  60 tablet  3   No current  facility-administered medications on file prior to visit.    I have reviewed and updated the following as appropriate: allergies and current medications SHx: non smoker  Review of Systems See HPI    Objective:   Physical Exam BP 126/87  Pulse 111  Temp(Src) 98.5 F (36.9 C) (Oral)  Ht 5\' 4"  (1.626 m)  Wt 276 lb (125.193 kg)  BMI 47.35 kg/m2 Gen: alert, cooperative, NAD HEENT: AT/[place], sclera white, MMM Neck: supple CV: RRR, no murmurs; normal rate on ascultation Abd: +BS, soft, mild epigastric tenderness; no rebound or guarding Ext: no edema, 2+ DP pulses bilaterally; left anterior shin diffusely tender with out focality      Assessment & Plan:

## 2014-02-27 NOTE — Patient Instructions (Signed)
It was nice to see you!  I think your stomach issues are related to your anxiety. I increased the Klonopin back to 3 times a day as needed.  You also have shin splints on the left. Ice it 3 times a day for 20 minutes. Alternate tylenol and ibuprofen as needed. Use tramadol for severe pain. I would recommend getting a compression sleeve to wear over that shin.  Good luck with your move!

## 2014-02-27 NOTE — Assessment & Plan Note (Signed)
3 months supply of Adderall provided today.

## 2014-03-16 ENCOUNTER — Ambulatory Visit: Payer: Medicare Other | Admitting: Family Medicine

## 2014-03-18 ENCOUNTER — Ambulatory Visit: Payer: Medicare Other | Admitting: Family Medicine

## 2014-03-19 ENCOUNTER — Ambulatory Visit: Payer: Medicare Other | Admitting: Family Medicine

## 2014-03-20 ENCOUNTER — Telehealth: Payer: Self-pay | Admitting: Family Medicine

## 2014-03-20 DIAGNOSIS — F4001 Agoraphobia with panic disorder: Secondary | ICD-10-CM

## 2014-03-20 NOTE — Telephone Encounter (Signed)
Pt called because the Omeprazole 40 mg is helping her stomach but making her dizzy. She was hoping that the doctor can reduce the mg so that she wouldn't be dizzy. She also needs a refill on her Zofran 4 mg called in. jw

## 2014-03-23 MED ORDER — ONDANSETRON HCL 4 MG PO TABS: 4.0000 mg | ORAL_TABLET | Freq: Three times a day (TID) | ORAL | Status: DC | PRN

## 2014-03-23 MED ORDER — OMEPRAZOLE 20 MG PO CPDR: 40.0000 mg | DELAYED_RELEASE_CAPSULE | Freq: Every day | ORAL | Status: DC

## 2014-03-23 NOTE — Telephone Encounter (Signed)
Changed omeprazole dose due to pts complaint of dizziness, although this unlikely the reason. Refilled zofran. Will ask staff to inform and ask her to keep her appt in 3 days.   Laroy Apple, MD [place] Resident, PGY-3 03/23/2014, 9:11 AM

## 2014-03-23 NOTE — Telephone Encounter (Signed)
Pt informed. Laura Horton Dawn  

## 2014-03-26 ENCOUNTER — Ambulatory Visit: Payer: Medicare Other | Admitting: Family Medicine

## 2014-03-27 ENCOUNTER — Ambulatory Visit: Payer: Medicare Other | Admitting: Family Medicine

## 2014-03-31 ENCOUNTER — Ambulatory Visit: Payer: Medicare Other | Admitting: Family Medicine

## 2014-04-13 ENCOUNTER — Ambulatory Visit: Payer: Medicare Other | Admitting: Family Medicine

## 2014-04-14 ENCOUNTER — Ambulatory Visit: Payer: Medicare Other | Admitting: Family Medicine

## 2014-04-15 ENCOUNTER — Ambulatory Visit: Payer: Medicare Other | Admitting: Family Medicine

## 2014-05-05 ENCOUNTER — Ambulatory Visit (INDEPENDENT_AMBULATORY_CARE_PROVIDER_SITE_OTHER): Payer: Medicare Other | Admitting: Family Medicine

## 2014-05-05 ENCOUNTER — Encounter: Payer: Self-pay | Admitting: Family Medicine

## 2014-05-05 VITALS — BP 160/98 | HR 118 | Temp 98.1°F | Ht 64.0 in | Wt 275.3 lb

## 2014-05-05 DIAGNOSIS — G4700 Insomnia, unspecified: Secondary | ICD-10-CM

## 2014-05-05 DIAGNOSIS — F4001 Agoraphobia with panic disorder: Secondary | ICD-10-CM

## 2014-05-05 DIAGNOSIS — R0989 Other specified symptoms and signs involving the circulatory and respiratory systems: Secondary | ICD-10-CM

## 2014-05-05 DIAGNOSIS — J302 Other seasonal allergic rhinitis: Secondary | ICD-10-CM

## 2014-05-05 DIAGNOSIS — F319 Bipolar disorder, unspecified: Secondary | ICD-10-CM

## 2014-05-05 DIAGNOSIS — M545 Low back pain, unspecified: Secondary | ICD-10-CM

## 2014-05-05 DIAGNOSIS — R0609 Other forms of dyspnea: Secondary | ICD-10-CM

## 2014-05-05 DIAGNOSIS — J309 Allergic rhinitis, unspecified: Secondary | ICD-10-CM

## 2014-05-05 DIAGNOSIS — M549 Dorsalgia, unspecified: Secondary | ICD-10-CM | POA: Insufficient documentation

## 2014-05-05 DIAGNOSIS — F988 Other specified behavioral and emotional disorders with onset usually occurring in childhood and adolescence: Secondary | ICD-10-CM

## 2014-05-05 DIAGNOSIS — R0683 Snoring: Secondary | ICD-10-CM | POA: Insufficient documentation

## 2014-05-05 MED ORDER — IBUPROFEN 800 MG PO TABS: 800.0000 mg | ORAL_TABLET | Freq: Three times a day (TID) | ORAL | Status: DC | PRN

## 2014-05-05 MED ORDER — CLONAZEPAM 2 MG PO TABS: 2.0000 mg | ORAL_TABLET | Freq: Three times a day (TID) | ORAL | Status: DC | PRN

## 2014-05-05 NOTE — Patient Instructions (Signed)
Great to meet you today!  I have written a referral for psychiatry I have ordered a sleep study, they will call you

## 2014-05-05 NOTE — Assessment & Plan Note (Signed)
With itching Recommended taking zyrtec, She isnt currently Also recommended vistaril which she has at home Start neti pot - she undertsands how to use this b/c her son does

## 2014-05-05 NOTE — Assessment & Plan Note (Signed)
Given Rx for klonopin X 1 month, would like her to establish with psych and return for further direction if she doesn't go to psych.  Continue protonix

## 2014-05-05 NOTE — Assessment & Plan Note (Signed)
Concern for OSA expressed by her Reasonable concern given HX - Split night study

## 2014-05-05 NOTE — Progress Notes (Signed)
Patient ID: Laura Horton, female   DOB: 1976-02-18, 38 y.o.   MRN: [id number]  Kenn File, MD Phone: [phone]  Subjective:  Chief complaint-noted  Pt Here for  Follow up mood and recent fall  Fall- back pain  States she hurt her back moving things in July and has had mid low back pain over the last several wweeks. Walking normally without LE weakness/numbness, saddle anesthesia, or bowel or bladder dysfunction.   2-3 weeks ago she was standing when her L leg gave out and she fell. She has walked normally since and feels well today despite the continued back pain. Back pain is R sided paraspinal back pain with some associated calf pain without overt radiation. Not helped by tramadol or NSAIDs.   Also states that she has been feeling not well for several weeks. She has been missing appt here due to not feeling well and pain. States she is congested, shaky, nauseous, and itching. She is not taking her zyrtec and vistaril hasnt helped the itching.   Psych States she has ADHD, BPD, and anxiety States she stopped klonopin completely and previous PCP put her back on on last visit Wants to go back to vyvanse b/c adderall makes her jittery and she doesn't do as well on it, not open to decreasing ior adjusting dose, does not want to discuss this medication Not sleeping well Taking klonopin daily, taking other meds except for Restoril as she states this made her do weird things.  Has not seen a therapist   Non smoker  ROS-  Per HPI plus   Past Medical History Patient Active Problem List   Diagnosis Date Noted  . Snoring 05/05/2014  . Back pain 05/05/2014  . Pain in left shin 02/27/2014  . Nausea alone 01/29/2014  . Seasonal allergies 12/19/2013  . Blurry vision 11/04/2013  . ADD (attention deficit disorder) 07/06/2013  . INSOMNIA, CHRONIC 03/10/2010  . 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 Presence of intrauterine  contraceptive device 09/22/2009  . OBESITY 09/08/2009  . BIPOLAR DISORDER UNSPECIFIED 09/08/2009    Medications- reviewed and updated Current Outpatient Prescriptions  Medication Sig Dispense Refill  . amphetamine-dextroamphetamine (ADDERALL) 30 MG tablet Take 1 tablet (30 mg total) by mouth 2 (two) times daily.  60 tablet  0  . clonazePAM (KLONOPIN) 2 MG tablet Take 1 tablet (2 mg total) by mouth 3 (three) times daily as needed for anxiety.  90 tablet  0  . cloNIDine (CATAPRES) 0.2 MG tablet Take 1 tablet (0.2 mg total) by mouth 4 (four) times daily - after meals and at bedtime.  120 tablet  11  . escitalopram (LEXAPRO) 20 MG tablet Take 2 tablets (40 mg total) by mouth daily.  60 tablet  11  . montelukast (SINGULAIR) 10 MG tablet Take 1 tablet (10 mg total) by mouth at bedtime.  30 tablet  3  . omeprazole (PRILOSEC) 20 MG capsule Take 2 capsules (40 mg total) by mouth daily. For 2 weeks, then as needed.  30 capsule  0  . ondansetron (ZOFRAN) 4 MG tablet Take 1 tablet (4 mg total) by mouth every 8 (eight) hours as needed for nausea or vomiting.  20 tablet  0  . topiramate (TOPAMAX) 100 MG tablet Take 1 tablet (100 mg total) by mouth 2 (two) times daily.  60 tablet  3  . cetirizine (ZYRTEC) 10 MG tablet Take 1 tablet (10 mg total) by mouth daily.  30 tablet  11  . gabapentin (NEURONTIN) 300 MG capsule       . hydrOXYzine (VISTARIL) 50 MG capsule Take 1 capsule (50 mg total) by mouth every 6 (six) hours as needed.  120 capsule  6  . ibuprofen (ADVIL,MOTRIN) 800 MG tablet Take 1 tablet (800 mg total) by mouth every 8 (eight) hours as needed.  30 tablet  3  . mometasone (NASONEX) 50 MCG/ACT nasal spray Place 2 sprays into the nose daily.  17 g  12  . traMADol (ULTRAM) 50 MG tablet Take 1 tablet (50 mg total) by mouth every 8 (eight) hours as needed.  30 tablet  0   No current facility-administered medications for this visit.    Objective: BP 160/98  Pulse 118  Temp(Src) 98.1 F (36.7 C)  (Oral)  Ht 5\' 4"  (1.626 m)  Wt 275 lb 4.8 oz (124.875 kg)  BMI 47.23 kg/m2 Gen: NAD, alert, cooperative with exam HEENT: NCAT CV: RRR, good S1/S2, no murmur Resp: CTABL, no wheezes, non-labored Ext: No edema, warm Neuro: Alert, interactive Psych: Odd affect seemed slightly anxious, tangential in her description of HPI.   Assessment/Plan:  ADD (attention deficit disorder) Does not want adderall anymore When asked why and attempted to discuss she requested a psychiatrist Referred to psych as she is a very complex case with several psychotropic meds.   Back pain Low back pain after a fall in July and again 3 weeks ago No red flags, Legs "gave out" previously but this is not consistent and gait normal today Requesting medications but dioes not want NSAIDs or tramadol Declined opiate medication  PLain film of back and directions given to obtain.   BIPOLAR DISORDER UNSPECIFIED Clearly complex Requests psych referral Continue lexapro, topamax,  Klonopin, and clonidine   INSOMNIA, CHRONIC Requests meds Offered traazadone, she declines Explained she is already on klonopin so another benzo is not an option from me.   Seasonal allergies With itching Recommended taking zyrtec, She isnt currently Also recommended vistaril which she has at home Start neti pot - she undertsands how to use this b/c her son does  Snoring Concern for OSA expressed by her Reasonable concern given HX - Split night study  PANIC DISORDER WITH AGORAPHOBIA Given Rx for klonopin X 1 month, would like her to establish with psych and return for further direction if she doesn't go to psych.  Continue protonix     Orders Placed This Encounter  Procedures  . DG Lumbar Spine Complete    Standing Status: Future     Number of Occurrences:      Standing Expiration Date: 07/06/2015    Order Specific Question:  Reason for Exam (SYMPTOM  OR DIAGNOSIS REQUIRED)    Answer:  back pain after afall    Order  Specific Question:  Is the patient pregnant?    Answer:  No    Order Specific Question:  Preferred imaging location?    Answer:  [id number]  . Ambulatory referral to Psychiatry    Referral Priority:  Routine    Referral Type:  Psychiatric    Referral Reason:  Specialty Services Required    Requested Specialty:  Psychiatry    Number of Visits Requested:  1  . Split night study    Standing Status: Future     Number of Occurrences:      Standing Expiration Date: 05/06/2015    Order Specific Question:  Where should this test be performed:    Answer:  [facility]    Meds ordered this encounter  Medications  . ibuprofen (ADVIL,MOTRIN) 800 MG tablet    Sig: Take 1 tablet (800 mg total) by mouth every 8 (eight) hours as needed.    Dispense:  30 tablet    Refill:  3  . clonazePAM (KLONOPIN) 2 MG tablet    Sig: Take 1 tablet (2 mg total) by mouth 3 (three) times daily as needed for anxiety.    Dispense:  90 tablet    Refill:  0

## 2014-05-05 NOTE — Assessment & Plan Note (Signed)
Requests meds Offered traazadone, she declines Explained she is already on klonopin so another benzo is not an option from me.

## 2014-05-05 NOTE — Assessment & Plan Note (Signed)
Does not want adderall anymore When asked why and attempted to discuss she requested a psychiatrist Referred to psych as she is a very complex case with several psychotropic meds.

## 2014-05-05 NOTE — Assessment & Plan Note (Signed)
Low back pain after a fall in July and again 3 weeks ago No red flags, Legs "gave out" previously but this is not consistent and gait normal today Requesting medications but dioes not want NSAIDs or tramadol Declined opiate medication  PLain film of back and directions given to obtain.

## 2014-05-05 NOTE — Assessment & Plan Note (Signed)
Clearly complex Requests psych referral Continue lexapro, topamax,  Klonopin, and clonidine

## 2014-05-06 ENCOUNTER — Telehealth: Payer: Self-pay | Admitting: Family Medicine

## 2014-05-06 DIAGNOSIS — F319 Bipolar disorder, unspecified: Secondary | ICD-10-CM

## 2014-05-06 NOTE — Telephone Encounter (Signed)
Pt called and said that she is not feeling good with new doctor Dr. Wendi Snipes. She said that he didn't take any note or give her any medications that she feels she needs. She said that she wants a doctor that is going to take notes and give her medication when she needs it. She said that she has had the hiccups for a week now and after using [employer] she knows that this is serious and the doctor didn't give her anything for this and didn't address the issue. She also has a lot going on and just doesn't know where to go to next. Please call. jw

## 2014-05-12 ENCOUNTER — Telehealth: Payer: Self-pay | Admitting: Family Medicine

## 2014-05-12 NOTE — Telephone Encounter (Signed)
Has a complaint about dr Wendi Snipes

## 2014-05-21 ENCOUNTER — Other Ambulatory Visit: Payer: Self-pay | Admitting: *Deleted

## 2014-05-22 MED ORDER — CLONAZEPAM 2 MG PO TABS: 2.0000 mg | ORAL_TABLET | Freq: Three times a day (TID) | ORAL | Status: DC | PRN

## 2014-05-22 NOTE — Telephone Encounter (Signed)
Given refill of klonopin. She will need an appt prior to another refill.   Laroy Apple, MD [place] Resident, PGY-3 05/22/2014, 3:42 PM

## 2014-05-26 ENCOUNTER — Telehealth: Payer: Self-pay | Admitting: Family Medicine

## 2014-05-26 DIAGNOSIS — F4001 Agoraphobia with panic disorder: Secondary | ICD-10-CM

## 2014-05-26 NOTE — Telephone Encounter (Signed)
Needs refill on omeprazole

## 2014-05-27 MED ORDER — OMEPRAZOLE 20 MG PO CPDR: 40.0000 mg | DELAYED_RELEASE_CAPSULE | Freq: Every day | ORAL | Status: DC

## 2014-05-27 NOTE — Telephone Encounter (Signed)
Omeprazole refilled, will ask staff to inform.   Laroy Apple, MD [place] Resident, PGY-3 05/27/2014, 8:07 AM

## 2014-05-28 NOTE — Telephone Encounter (Signed)
Jen Mow, I dont seen an actual psych referral in patient chart. Could you re-enter this please. Thanks

## 2014-05-28 NOTE — Telephone Encounter (Signed)
States Dr. Wendi Snipes showed a lack of interest during the visit.  "I didn't mesh well with him and didn't feel like he was really there."  Wanted to switch from Adderall to Vyvanse and Dr. Wendi Snipes was not willing to consider that or sign her application for pharmacy medication assistance card.  Has had problems with stomach--"nausea, not able to hold anything down, and getting worse."  States Dr. Wendi Snipes did not refer her to GI and she has not received psych referral as discussed at last OV.  Informed patient that I discussed this with Dr. Wendi Snipes and he prefers to listen to patient and make eye contact; doesn't usually take notes in room and prefers to document outside of the exam room.  Will change PCP to Dr. Sherril Cong.  Patient was informed that new PCP may also be unwilling to change ADHD med based on her med hx and whether change is warranted.  Patient verbalized understanding and scheduled appt for 06/05/14 at 2:30 pm to meet new MD. Burna Forts, BSN, RN-BC

## 2014-05-28 NOTE — Addendum Note (Signed)
Addended by: Timmothy Euler on: 05/28/2014 03:09 PM   Modules accepted: Orders

## 2014-05-28 NOTE — Telephone Encounter (Signed)
See previous phone note from 05/06/14  Burna Forts, BSN, RN-BC

## 2014-05-29 NOTE — Telephone Encounter (Signed)
Referral faxed to [facility].

## 2014-06-05 ENCOUNTER — Ambulatory Visit (INDEPENDENT_AMBULATORY_CARE_PROVIDER_SITE_OTHER): Payer: Medicare Other | Admitting: Family Medicine

## 2014-06-05 ENCOUNTER — Encounter: Payer: Self-pay | Admitting: Family Medicine

## 2014-06-05 VITALS — BP 118/79 | HR 93 | Temp 98.1°F | Wt 271.0 lb

## 2014-06-05 DIAGNOSIS — Z975 Presence of (intrauterine) contraceptive device: Secondary | ICD-10-CM

## 2014-06-05 DIAGNOSIS — F4001 Agoraphobia with panic disorder: Secondary | ICD-10-CM

## 2014-06-05 DIAGNOSIS — G8929 Other chronic pain: Secondary | ICD-10-CM

## 2014-06-05 DIAGNOSIS — F42 Obsessive-compulsive disorder: Secondary | ICD-10-CM

## 2014-06-05 DIAGNOSIS — H538 Other visual disturbances: Secondary | ICD-10-CM

## 2014-06-05 DIAGNOSIS — R1031 Right lower quadrant pain: Secondary | ICD-10-CM

## 2014-06-05 DIAGNOSIS — F909 Attention-deficit hyperactivity disorder, unspecified type: Secondary | ICD-10-CM

## 2014-06-05 DIAGNOSIS — F3131 Bipolar disorder, current episode depressed, mild: Secondary | ICD-10-CM

## 2014-06-05 DIAGNOSIS — F429 Obsessive-compulsive disorder, unspecified: Secondary | ICD-10-CM

## 2014-06-05 DIAGNOSIS — F988 Other specified behavioral and emotional disorders with onset usually occurring in childhood and adolescence: Secondary | ICD-10-CM

## 2014-06-05 MED ORDER — LISDEXAMFETAMINE DIMESYLATE 50 MG PO CAPS: 50.0000 mg | ORAL_CAPSULE | Freq: Every day | ORAL | Status: DC

## 2014-06-06 ENCOUNTER — Other Ambulatory Visit: Payer: Self-pay | Admitting: Family Medicine

## 2014-06-06 MED ORDER — THERA VITAL M PO TABS: 1.0000 | ORAL_TABLET | Freq: Every day | ORAL | Status: DC

## 2014-06-06 NOTE — Telephone Encounter (Signed)
Patient called complaining of chronic fatigue. She reports being recently seen by Dr Sherril Cong however she forgot to request a Rx for multivitamins. She denied any CP, SOB, unilateral weakness, speech or vision problems. Reports recently starting Vyvanse. I sent in Rx for OTC multivitamins and advised her to follow-up with Dr Sherril Cong for ongoing evaluation of her fatigue.

## 2014-06-09 ENCOUNTER — Encounter: Payer: Self-pay | Admitting: Obstetrics & Gynecology

## 2014-06-09 ENCOUNTER — Ambulatory Visit (INDEPENDENT_AMBULATORY_CARE_PROVIDER_SITE_OTHER): Payer: Medicare Other | Admitting: Psychiatry

## 2014-06-09 ENCOUNTER — Encounter (HOSPITAL_COMMUNITY): Payer: Self-pay | Admitting: Psychiatry

## 2014-06-09 ENCOUNTER — Encounter (INDEPENDENT_AMBULATORY_CARE_PROVIDER_SITE_OTHER): Payer: Self-pay

## 2014-06-09 VITALS — BP 121/83 | HR 90 | Ht 63.0 in | Wt 272.0 lb

## 2014-06-09 DIAGNOSIS — F411 Generalized anxiety disorder: Secondary | ICD-10-CM

## 2014-06-09 DIAGNOSIS — F909 Attention-deficit hyperactivity disorder, unspecified type: Secondary | ICD-10-CM

## 2014-06-09 DIAGNOSIS — F3181 Bipolar II disorder: Secondary | ICD-10-CM

## 2014-06-09 MED ORDER — CLONAZEPAM 1 MG PO TABS: 1.0000 mg | ORAL_TABLET | Freq: Two times a day (BID) | ORAL | Status: DC

## 2014-06-09 MED ORDER — ARIPIPRAZOLE 10 MG PO TABS: 10.0000 mg | ORAL_TABLET | Freq: Every day | ORAL | Status: DC

## 2014-06-09 MED ORDER — ESCITALOPRAM OXALATE 20 MG PO TABS: 20.0000 mg | ORAL_TABLET | Freq: Every day | ORAL | Status: DC

## 2014-06-09 MED ORDER — CLONIDINE HCL 0.2 MG PO TABS: 0.2000 mg | ORAL_TABLET | Freq: Two times a day (BID) | ORAL | Status: DC

## 2014-06-09 MED ORDER — HYDROXYZINE PAMOATE 50 MG PO CAPS: 50.0000 mg | ORAL_CAPSULE | ORAL | Status: DC | PRN

## 2014-06-09 NOTE — Patient Instructions (Signed)
Lower the dose of klonopine 1mg  bid.Lower clonidine bid. These meds are keeping you lethargic. Avoid sleeping during the day. Stop temazepam and work on sleep hygiene. Start abilify 10mg  qd. Lower the dose of hydroxyzine  Once a day if needed.

## 2014-06-09 NOTE — Progress Notes (Signed)
Patient ID: SALENA ORTLIEB, female   DOB: 1976/06/12, 38 y.o.   MRN: [id number]  [facility] Initial Psychiatric Assessment   TESLA KEELER [id number] 38 y.o.  06/09/2014 11:07 AM  Chief Complaint:  Depression and anxiety  History of Present Illness:   Patient Presents for Initial Evaluation with symptoms of depression. Says diagnosed with Bipolar 3 years ago by Dr. Ahmed Prima in [place] [employer] and is on disability. Has moved here last here continued her prescriptions by primary care but now wants to follow with psychiatrist. Krystal Clark complicated hisotry more so of depression and panic symptoms in past. Her klonopine was stopped 4 months ago she started having nervous stomach and klonopine 2mg  tid was restarted. Seems a high dose , she feels lethargic, more down but not hopless. Says she takes temazepam at night at times. Apparently she is sleeping during the day which keeps her up at night. Endoreses feeling, down, withdraw, no crying spells.  Aggravating factors: poor sleep and stress. Modifying factors: her son and activities if she can do Timing: stress related Severity ; Depression 5/10. Anxiety 5/10 Past meds used. Abilify helpful but some weight gain Seroquel weigh gain. Geodon caused tongue swelling  Associated symptoms: generalized worries, muscle tension. Decrease sleep and tiredness with excessive worries at times.  Feels slow and down with decreased energy. She appears to be on high dose of klonopine says since it has started she feels sleepy during the day and cant sleep at night. Difficulty focusing and organizing things recently more.  Meds may be keeping her tired and inattentive. She does have history of ADHD. Continues to take vyvanse.  Some meds are keeping her up and some are down. Discussed need to be adjusted.     Past Psychiatric History/Hospitalization(s) Yes in 2010 [place] for depression.  Has been on different medications. Complicated  history of ADHD, Bipolar.   Hospitalization for psychiatric illness: Yes History of Electroconvulsive Shock Therapy: No Prior Suicide Attempts: No  Medical History; Past Medical History  Diagnosis Date  . Anxiety   . Depression   . Bipolar 1 disorder   . ADHD (attention deficit hyperactivity disorder)   . Chronic back pain     Allergies: Allergies  Allergen Reactions  . Quetiapine     REACTION: Weight Gain  . Ziprasidone Mesylate     REACTION: Dyspnea, swollen tongue, neck pain, torticullis    Medications: Outpatient Encounter Prescriptions as of 06/09/2014  Medication Sig  . ARIPiprazole (ABILIFY) 10 MG tablet Take 1 tablet (10 mg total) by mouth daily.  . cetirizine (ZYRTEC) 10 MG tablet Take 1 tablet (10 mg total) by mouth daily.  . clonazePAM (KLONOPIN) 1 MG tablet Take 1 tablet (1 mg total) by mouth 2 (two) times daily.  . cloNIDine (CATAPRES) 0.2 MG tablet Take 1 tablet (0.2 mg total) by mouth 2 (two) times daily.  Marland Kitchen escitalopram (LEXAPRO) 20 MG tablet Take 1 tablet (20 mg total) by mouth daily.  . hydrOXYzine (VISTARIL) 50 MG capsule Take 1 capsule (50 mg total) by mouth as needed.  Marland Kitchen ibuprofen (ADVIL,MOTRIN) 800 MG tablet Take 1 tablet (800 mg total) by mouth every 8 (eight) hours as needed.  Marland Kitchen lisdexamfetamine (VYVANSE) 50 MG capsule Take 1 capsule (50 mg total) by mouth daily.  . mometasone (NASONEX) 50 MCG/ACT nasal spray Place 2 sprays into the nose daily.  . montelukast (SINGULAIR) 10 MG tablet Take 1 tablet (10 mg total) by mouth at bedtime.  . Multiple Vitamins-Minerals (MULTIVITAMIN) tablet  Take 1 tablet by mouth daily.  Marland Kitchen omeprazole (PRILOSEC) 20 MG capsule Take 2 capsules (40 mg total) by mouth daily. For 2 weeks, then as needed.  . ondansetron (ZOFRAN) 4 MG tablet Take 1 tablet (4 mg total) by mouth every 8 (eight) hours as needed for nausea or vomiting.  . topiramate (TOPAMAX) 100 MG tablet Take 1 tablet (100 mg total) by mouth 2 (two) times daily.  .  traMADol (ULTRAM) 50 MG tablet Take 1 tablet (50 mg total) by mouth every 8 (eight) hours as needed.  . [DISCONTINUED] clonazePAM (KLONOPIN) 2 MG tablet Take 1 tablet (2 mg total) by mouth 3 (three) times daily as needed for anxiety.  . [DISCONTINUED] cloNIDine (CATAPRES) 0.2 MG tablet Take 1 tablet (0.2 mg total) by mouth 4 (four) times daily - after meals and at bedtime.  . [DISCONTINUED] escitalopram (LEXAPRO) 20 MG tablet Take 2 tablets (40 mg total) by mouth daily.  . [DISCONTINUED] gabapentin (NEURONTIN) 300 MG capsule   . [DISCONTINUED] hydrOXYzine (VISTARIL) 50 MG capsule Take 1 capsule (50 mg total) by mouth every 6 (six) hours as needed.     Substance Abuse History:   Family History; Family History  Problem Relation Age of Onset  . Heart disease Mother   . Hypertension Mother   . Hyperlipidemia Mother   . Cancer Father     LUNG  . Mental illness Maternal Grandmother   . Cancer Maternal Grandmother     breast  . Depression Maternal Grandmother   . Bipolar disorder Maternal Aunt   . ADD / ADHD Cousin       Biopsychosocial History:  Grew up with mostly GM and Mother. Somewhat difficult growing up but denies trauma. Finished high school. 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 Now on disability. Patient lives with her Mom and 51 year old son.     Labs:  No results found for this or any previous visit (from the past 2160 hour(s)).     Musculoskeletal: Strength & Muscle Tone: within normal limits Gait & Station: normal Patient leans: N/A  Mental Status Examination;   Psychiatric Specialty Exam: Physical Exam  ROS  There were no vitals taken for this visit.There is no weight on file to calculate BMI.  General Appearance: Casual  Eye Contact::  Fair  Speech:  Slow  Volume:  Decreased  Mood:  Dysphoric  Affect:  Constricted  Thought Process:  Coherent  Orientation:  Full (Time,  Place, and Person)  Thought Content:  Rumination  Suicidal Thoughts:  No  Homicidal Thoughts:  No  Memory:  Immediate;   Fair Recent;   Fair  Judgement:  Fair  Insight:  Shallow  Psychomotor Activity:  Decreased  Concentration:  Fair  Recall:  Fair  Akathisia:  Negative  Handed:  Right  AIMS (if indicated):     Assets:  Desire for Improvement Financial Resources/Insurance Leisure Time  Sleep:        Assessment: Axis I: Bipolar disorder type 2, depressed. ADHD per history. Generalized Anxiety disorder  Axis II: deferred  Axis III:  Past Medical History  Diagnosis Date  . Anxiety   . Depression   . Bipolar 1 disorder   . ADHD (attention deficit hyperactivity disorder)   . Chronic back pain     Axis IV: psychosoical, medical including backpain. financial   Treatment Plan and Summary: Start abilify for bipolar depression. Lower lexapro to 20mg  qd.  Lower clonidine 0.5 mbg bid.  Lower klonopine to 1mg  bid. Lower hydroxyzine 50mg  qd prn. Stop temazepam( she is not taking it already) Once we have these meds as above will reassess further adjustment or need to contine adhd meds. Goal would be for her to be on one mood stabilizer and one medication for augmentation if needed. Denies manic symptoms.  Abilify with low dose of lexapro would be goal. Would continue to monitor dose of klonopine she is getting if can decrease so her lethargy improves and would need or need a lower dose vyvanse.  Pertinent Labs and Relevant Prior Notes reviewed. Medication Side effects, benefits and risks reviewed/discussed with Patient. Time given for patient to respond and asks questions regarding the Diagnosis and Medications. Safety concerns and to report to ER if suicidal or call 911. Relevant Medications refilled or called in to pharmacy. Discussed weight maintenance and Sleep Hygiene. Follow up with Primary care provider in regards to Medical conditions. Recommend compliance with medications  and follow up office appointments. Discussed to avail opportunity to consider or/and continue Individual therapy with Counselor. Greater than 50% of time was spend in counseling and coordination of care with the patient.  Schedule for Follow up visit in 4 weeks or call in earlier as necessary.   Merian Capron, MD 06/09/2014

## 2014-06-10 ENCOUNTER — Other Ambulatory Visit (HOSPITAL_COMMUNITY): Payer: Self-pay | Admitting: Psychiatry

## 2014-06-11 ENCOUNTER — Encounter: Payer: Self-pay | Admitting: Gastroenterology

## 2014-06-16 NOTE — Assessment & Plan Note (Signed)
Multiple psych problems and complex med regimen - refer to psychiatry

## 2014-06-16 NOTE — Assessment & Plan Note (Signed)
Multiple psych problems and complex med regimen - refer to psychiatry - switch adderall to vyvanse which has worked well in the past per patient - future management per psych

## 2014-06-16 NOTE — Assessment & Plan Note (Addendum)
Complains of blurry vision and wants eyes checked, thinks they're getting worse - refer to optho

## 2014-06-16 NOTE — Progress Notes (Signed)
   Subjective:    Patient ID: Laura Horton, female    DOB: 04-18-1976, 38 y.o.   MRN: [id number]  HPI Pt presents to meet me and wants multiple referrals for multiple chronic issues.   She is being treated with multiple psych meds for bipolar, ocd, adhd, panic. Wondering if psych might be helpful. Also reports she wants to switch from adderall to vyvanse because it reportedly worked better for her in the past.  She has had blurry vision which is worsening and needs eyes checked  She has had an ultrasound for her abdominal pain and was told that her IUD is out of place and she will need sedation to have it removed. In the chart it looks like her last U/S was in 2011 and it was well placed at that time.  She has had chronic abdominal pain, bloating and constipation for several years. She thinks she may have had an ulcer in the past. She is having trouble eating because of severe postprandial pain.   Review of Systems See HPI    Objective:   Physical Exam  Nursing note and vitals reviewed. Constitutional: She is oriented to person, place, and time. She appears well-developed and well-nourished. No distress.  HENT:  Head: Normocephalic and atraumatic.  Eyes: Conjunctivae are normal. Right eye exhibits no discharge. Left eye exhibits no discharge. No scleral icterus.  Cardiovascular: Normal rate.   Pulmonary/Chest: Effort normal.  Abdominal: Soft. Bowel sounds are normal. She exhibits no distension. There is no tenderness. There is no rebound.  Neurological: She is alert and oriented to person, place, and time.  Skin: Skin is warm and dry. She is not diaphoretic.  Psychiatric: She has a normal mood and affect. Her behavior is normal.          Assessment & Plan:

## 2014-06-16 NOTE — Assessment & Plan Note (Signed)
Per patient has had chronic pain and constipation. Requests she see GI as not satisfied by prior primary care eval - refer to GI

## 2014-06-16 NOTE — Assessment & Plan Note (Signed)
Per patient is improperly positioned and may require sedation to remove, would like to see gyn - refer to gyn for evaluation and removal

## 2014-06-18 ENCOUNTER — Telehealth: Payer: Self-pay | Admitting: Family Medicine

## 2014-06-18 NOTE — Telephone Encounter (Signed)
Pt checking status of referral appts, is only waiting on ophthalmology and psychiatry referral. Pt did go see a psychiatrist in [place] but was not happy, says they do not prescribe the meds she is currently taking. Pt wants to know if MD will continue to prescribe the klonopin and vistaril until she is able to get in with another psychiatrist.

## 2014-06-18 NOTE — Telephone Encounter (Signed)
Spoke with patient and informed her of Optho appt., also gave patient the number to [facility] to set up psych. Appt. Informed patient that I would forward message to PCP regarding meds.

## 2014-06-23 NOTE — Telephone Encounter (Signed)
Appointment scheduled for 10/29.

## 2014-06-23 NOTE — Telephone Encounter (Signed)
Please inform patient that I will temporarily continue this meds for her but she will need see another psychiatrist ASAP. She should make an appointment to come in when these need to be refilled.

## 2014-06-24 ENCOUNTER — Telehealth: Payer: Self-pay | Admitting: Family Medicine

## 2014-06-24 NOTE — Telephone Encounter (Signed)
[facility] After Hours Line  Pain in left arm with paresthesia with associated pain in left leg. Has no weakness however says in the past her leg has "given out. Sometimes numbness causes her to fall. Symptoms started a few months ago. Patient also states she has brown colored marks on left arm. Symptoms are intermittent. Symptoms sometimes wake her out of her sleep. Flicking her hands improves the symptoms. Pain in her leg is worse at night. Ibuprofen helps for a short period of time. No recent injury. She has back pain since July. She was evaluated in the ED and was told she had a pinched nerve.  Recommended to make appointment in the morning. Does not sound like something acute. Although I haven't assessed, possibly component of carpal tunnel. Patient OK with following up in the AM.  Cordelia Poche, MD PGY-2, [facility] 06/25/2014, 1:39 AM

## 2014-06-25 ENCOUNTER — Ambulatory Visit: Payer: Self-pay | Admitting: Family Medicine

## 2014-07-02 ENCOUNTER — Encounter: Payer: Self-pay | Admitting: Family Medicine

## 2014-07-02 ENCOUNTER — Ambulatory Visit (INDEPENDENT_AMBULATORY_CARE_PROVIDER_SITE_OTHER): Payer: Medicare Other | Admitting: Family Medicine

## 2014-07-02 VITALS — BP 148/116 | HR 99 | Temp 98.2°F | Ht 63.0 in | Wt 274.0 lb

## 2014-07-02 DIAGNOSIS — M545 Low back pain, unspecified: Secondary | ICD-10-CM

## 2014-07-02 DIAGNOSIS — B9689 Other specified bacterial agents as the cause of diseases classified elsewhere: Secondary | ICD-10-CM

## 2014-07-02 DIAGNOSIS — F3130 Bipolar disorder, current episode depressed, mild or moderate severity, unspecified: Secondary | ICD-10-CM

## 2014-07-02 DIAGNOSIS — J329 Chronic sinusitis, unspecified: Secondary | ICD-10-CM

## 2014-07-02 DIAGNOSIS — A499 Bacterial infection, unspecified: Secondary | ICD-10-CM

## 2014-07-02 MED ORDER — TRAMADOL HCL 50 MG PO TABS: 50.0000 mg | ORAL_TABLET | Freq: Three times a day (TID) | ORAL | Status: DC | PRN

## 2014-07-02 MED ORDER — CLONAZEPAM 1 MG PO TABS: 1.0000 mg | ORAL_TABLET | Freq: Three times a day (TID) | ORAL | Status: DC | PRN

## 2014-07-02 MED ORDER — ARIPIPRAZOLE 10 MG PO TABS: 10.0000 mg | ORAL_TABLET | Freq: Every day | ORAL | Status: DC

## 2014-07-02 MED ORDER — AMOXICILLIN-POT CLAVULANATE 875-125 MG PO TABS: 1.0000 | ORAL_TABLET | Freq: Two times a day (BID) | ORAL | Status: DC

## 2014-07-02 NOTE — Patient Instructions (Signed)
I am refilling your klonopin and abilify but you need to get in to see another psychiatrist as soon as possible as I will not be able to continue doing this.  I am also refilling the tramadol for your back and arm pain.  I have prescribed an antibiotic for your sinuses, you will need to take it twice a day for the next 10 days.

## 2014-07-10 ENCOUNTER — Ambulatory Visit (HOSPITAL_COMMUNITY): Payer: Self-pay | Admitting: Psychiatry

## 2014-07-13 ENCOUNTER — Other Ambulatory Visit: Payer: Self-pay | Admitting: Family Medicine

## 2014-07-13 NOTE — Telephone Encounter (Signed)
Augmentin should have covered a bacterial sinus infection. If she is still not feeling well she should make an appointment to be re-evaluated as her problem is now more likely to be a virus or allergies.  Thanks

## 2014-07-13 NOTE — Telephone Encounter (Signed)
Has finished Augmentin for sinus infection Pt says she usually gets zpack and amoxicilin for sinus infection Would like to have that now

## 2014-07-14 NOTE — Telephone Encounter (Signed)
Pt informed. Pt wants to know why you changed her klonopin to 1 MG? Zavannah Deblois CMA

## 2014-07-14 NOTE — Telephone Encounter (Signed)
I gave her the 1mg  klonopin because that is what her psychiatrist recommended. When she gets a new psychiatrist she can discuss going back up with them, if that is what she wants to do.

## 2014-07-15 ENCOUNTER — Ambulatory Visit: Payer: Self-pay | Admitting: Family Medicine

## 2014-07-16 NOTE — Telephone Encounter (Signed)
Left message for patient to return call.

## 2014-07-17 ENCOUNTER — Encounter: Payer: Self-pay | Admitting: Obstetrics & Gynecology

## 2014-07-20 NOTE — Assessment & Plan Note (Signed)
Patient not pleased with her new psychiatrist, planning to find another - refilled klonopin and abilify x1 to give her time to find a new provider - made it clear this would not continue and finding a psych provider is essential to proper management of her significant illness

## 2014-07-20 NOTE — Progress Notes (Signed)
   Subjective:    Patient ID: Laura Horton, female    DOB: 1976/04/18, 38 y.o.   MRN: [id number]  HPI UPPER RESPIRATORY INFECTION  Onset: 1 week  Course: worsening Better with: nothing Meds tried: sudafed Sick contacts: none  Nasal discharge (color,laterality): green/yellow, bilateral  Sinusitis Risk Factors Fever: yes   Headache/face pain: yes  Double sickening: no  Tooth pain: yes   Allergy Risk Factors: Sneezing: no  Itchy scratchy throat: no  Seasonal sx: no   Flu Risk Factors Headache: yes  Muscle aches: no  Severe fatigue: no    Red Flags  Stiff neck: no  Dyspnea: no  Rash: no  Swallowing difficulty: no       Review of Systems See HPI    Objective:   Physical Exam  Constitutional: She is oriented to person, place, and time. She appears well-developed and well-nourished. No distress.  HENT:  Head: Normocephalic and atraumatic.  Nose: Mucosal edema and rhinorrhea present. No nose lacerations, sinus tenderness, nasal deformity, septal deviation or nasal septal hematoma. No epistaxis.  No foreign bodies. Right sinus exhibits maxillary sinus tenderness. Right sinus exhibits no frontal sinus tenderness. Left sinus exhibits maxillary sinus tenderness. Left sinus exhibits no frontal sinus tenderness.  Mouth/Throat: Uvula is midline, oropharynx is clear and moist and mucous membranes are normal. No oropharyngeal exudate.  Eyes: Conjunctivae are normal. Right eye exhibits no discharge. Left eye exhibits no discharge. No scleral icterus.  Neck: Normal range of motion. Neck supple.  Cardiovascular: Normal rate, regular rhythm and normal heart sounds.   No murmur heard. Pulmonary/Chest: Effort normal and breath sounds normal. No respiratory distress. She has no wheezes.  Abdominal: Soft. Bowel sounds are normal. She exhibits no distension. There is no tenderness.  Lymphadenopathy:    She has no cervical adenopathy.  Neurological: She is alert and oriented to  person, place, and time.  Skin: Skin is warm and dry. She is not diaphoretic.  Psychiatric: She has a normal mood and affect. Her behavior is normal.  Nursing note and vitals reviewed.         Assessment & Plan:

## 2014-07-20 NOTE — Assessment & Plan Note (Addendum)
Sinusitis - augmentin x10 days - sx management recs given - rtc if not improving in 5 days

## 2014-07-29 ENCOUNTER — Encounter: Payer: Self-pay | Admitting: Family Medicine

## 2014-08-10 ENCOUNTER — Ambulatory Visit: Payer: Self-pay | Admitting: Gastroenterology

## 2014-08-10 NOTE — Progress Notes (Signed)
Patient ID: Laura Horton, female   DOB: [birth year], 38 y.o.   MRN: [id number] Patient no-showed today's appointment; provider notified for review of record.      Please send a no-show letter to the patient recommending that they reschedule the appointment.   Also send a letter to the referring provider alerting them of the NO SHOW.

## 2014-08-24 ENCOUNTER — Other Ambulatory Visit (HOSPITAL_COMMUNITY)
Admission: RE | Admit: 2014-08-24 | Discharge: 2014-08-24 | Disposition: A | Discharge status: Home / Self Care | Payer: Medicare Other | Source: Ambulatory Visit | Attending: Family Medicine | Admitting: Family Medicine

## 2014-08-24 ENCOUNTER — Ambulatory Visit (INDEPENDENT_AMBULATORY_CARE_PROVIDER_SITE_OTHER): Payer: Medicare Other | Admitting: Family Medicine

## 2014-08-24 ENCOUNTER — Encounter: Payer: Self-pay | Admitting: Family Medicine

## 2014-08-24 VITALS — BP 125/90 | HR 105 | Temp 98.5°F | Ht 63.0 in | Wt 268.0 lb

## 2014-08-24 DIAGNOSIS — Z1151 Encounter for screening for human papillomavirus (HPV): Secondary | ICD-10-CM | POA: Diagnosis present

## 2014-08-24 DIAGNOSIS — Z124 Encounter for screening for malignant neoplasm of cervix: Secondary | ICD-10-CM | POA: Insufficient documentation

## 2014-08-24 DIAGNOSIS — N920 Excessive and frequent menstruation with regular cycle: Secondary | ICD-10-CM

## 2014-08-24 DIAGNOSIS — F429 Obsessive-compulsive disorder, unspecified: Secondary | ICD-10-CM

## 2014-08-24 DIAGNOSIS — Z309 Encounter for contraceptive management, unspecified: Secondary | ICD-10-CM

## 2014-08-24 DIAGNOSIS — F42 Obsessive-compulsive disorder: Secondary | ICD-10-CM

## 2014-08-24 DIAGNOSIS — Z3009 Encounter for other general counseling and advice on contraception: Secondary | ICD-10-CM

## 2014-08-24 DIAGNOSIS — Z8639 Personal history of other endocrine, nutritional and metabolic disease: Secondary | ICD-10-CM | POA: Insufficient documentation

## 2014-08-24 MED ORDER — LEVONORGEST-ETH ESTRAD 91-DAY 0.15-0.03 &0.01 MG PO TABS
1.0000 | ORAL_TABLET | Freq: Every day | ORAL | Status: DC
Start: 2014-08-24 — End: 2017-05-23

## 2014-08-24 MED ORDER — CLONAZEPAM 2 MG PO TABS: 2.0000 mg | ORAL_TABLET | Freq: Three times a day (TID) | ORAL | Status: DC | PRN

## 2014-08-24 NOTE — Assessment & Plan Note (Addendum)
Reporting worsening of anxiety and obsession symptoms since decrease in klonopin dose, still has not seen psych - gave list of psychiatrists and stressed importance of this - resume 2mg  tid klonopin dose

## 2014-08-24 NOTE — Assessment & Plan Note (Signed)
Longstanding menorrhagia, had IUD but was removed and insurance will not pay for another. Is on amethia and insurance will pay all of it if i prescribe. - Rx for amethia (OCP)

## 2014-08-24 NOTE — Assessment & Plan Note (Signed)
Patient reports her sisters have recently been diagnosed with severe vitamin d deficiency - check vitamin d level today

## 2014-08-24 NOTE — Patient Instructions (Signed)
I am glad to hear you are feeling some better. I still think it is important for you to see a specialist who can manage your psychiatric medications. I have refilled your klonopin and amethia today and we will call with the results of your vitamin D test.

## 2014-08-24 NOTE — Progress Notes (Signed)
   Subjective:    Patient ID: Laura Horton, female    DOB: Dec 03, 1975, 38 y.o.   MRN: [id number]  HPI Pt presents for annual exam and pap smear with multiple questions about her meds.  She reports her depression and anxiety are relatively well controlled right now but she can tell that she is having more obsessive thoughts since her klonopin dose was reduced. She wants to go back up. She still has not seen another psychiatrist and is nervous because she feels somewhat normal and doesn't want a new doctor to mess things up.   She has never had an abnormal pap and thinks her last was in 2013. On chart review it was 2011.   Review of Systems See HPI    Objective:   Physical Exam  Constitutional: She appears well-developed and well-nourished. No distress.  HENT:  Head: Normocephalic and atraumatic.  Eyes: Conjunctivae are normal. Right eye exhibits no discharge. Left eye exhibits no discharge. No scleral icterus.  Cardiovascular: Normal rate.   Pulmonary/Chest: Effort normal.  Abdominal: Soft. She exhibits no distension. There is no tenderness.  Genitourinary: Vagina normal and uterus normal. There is no rash, tenderness, lesion or injury on the right labia. There is no rash, tenderness, lesion or injury on the left labia. Cervix exhibits friability. Cervix exhibits no motion tenderness and no discharge. Right adnexum displays no mass, no tenderness and no fullness. Left adnexum displays no mass, no tenderness and no fullness. No erythema, tenderness or bleeding in the vagina. No foreign body around the vagina. No signs of injury around the vagina. No vaginal discharge found.  Skin: She is not diaphoretic.  Nursing note and vitals reviewed.         Assessment & Plan:

## 2014-08-25 LAB — VITAMIN D 25 HYDROXY (VIT D DEFICIENCY, FRACTURES): Vit D, 25-Hydroxy: 11 ng/mL — ABNORMAL LOW (ref 30–100)

## 2014-08-25 LAB — CYTOLOGY - PAP

## 2014-08-29 ENCOUNTER — Telehealth: Payer: Self-pay | Admitting: Family Medicine

## 2014-08-29 NOTE — Telephone Encounter (Signed)
After hours line  Patient calling in for 2 days congestion and sore throat. She is requesting augmentin. She is not having any fevers. She states she did not sleep well last night.   Recommended a visit at [facility] if she need sto be seen today, she would like to wait so I recommended calling for a SDA Monday morning. Recommended chloraseptic spray for throat pain. Again encouraged her to be seen if she feels she needs antibiotics today.   Laroy Apple, MD [facility], PGY-3 08/29/2014, 12:01 PM

## 2014-08-31 ENCOUNTER — Other Ambulatory Visit (HOSPITAL_BASED_OUTPATIENT_CLINIC_OR_DEPARTMENT_OTHER): Payer: Self-pay | Admitting: Family Medicine

## 2014-08-31 MED ORDER — CHOLECALCIFEROL 25 MCG (1000 UT) PO TABS: 1000.0000 [IU] | ORAL_TABLET | Freq: Every day | ORAL | Status: DC

## 2014-09-01 ENCOUNTER — Telehealth: Payer: Self-pay | Admitting: *Deleted

## 2014-09-01 NOTE — Telephone Encounter (Signed)
Pt informed. Laura Horton, CMA  

## 2014-09-01 NOTE — Telephone Encounter (Signed)
-----   Message from Frazier Richards, MD sent at 08/31/2014  3:27 PM EST ----- Please inform patient that her pap smear was normal and she will need another in 5 years. Please also tell her that her vitamin D is very low, as she suspected, and I have called in a supplement that she should start taking to bring it up. Thanks

## 2014-09-21 ENCOUNTER — Other Ambulatory Visit: Payer: Self-pay | Admitting: *Deleted

## 2014-09-21 DIAGNOSIS — F411 Generalized anxiety disorder: Secondary | ICD-10-CM

## 2014-09-21 MED ORDER — HYDROXYZINE PAMOATE 50 MG PO CAPS: 50.0000 mg | ORAL_CAPSULE | ORAL | Status: DC | PRN

## 2014-09-23 ENCOUNTER — Telehealth: Payer: Self-pay | Admitting: Family Medicine

## 2014-09-23 MED ORDER — ALBUTEROL SULFATE HFA 108 (90 BASE) MCG/ACT IN AERS: 2.0000 | INHALATION_SPRAY | Freq: Four times a day (QID) | RESPIRATORY_TRACT | Status: DC | PRN

## 2014-09-23 NOTE — Telephone Encounter (Signed)
Patient describes multiple episodes nightly of waking up gasping for breath. This has started happening more over the last 2 nights. She has a cold and is not sure if that might be related. She does have some mild SOB during the day with exertion. She reports that albuterol has been helpful for this in the past. Called in albuterol. Discussed possible sleep apnea. Requested patient to make an appointment so that a sleep study can be arranged. Patient has significant daytime sleepiness and snores loudly. Reassured patient that this caused problems over the long term but she will not suffocate because her body will always wake her up. Patient agrees and will make appointment in the am.

## 2014-09-30 MED ORDER — HYDROXYZINE PAMOATE 50 MG PO CAPS: 50.0000 mg | ORAL_CAPSULE | ORAL | Status: DC | PRN

## 2014-09-30 NOTE — Addendum Note (Signed)
Addended by: Derl Barrow on: 09/30/2014 04:43 PM   Modules accepted: Orders

## 2014-09-30 NOTE — Telephone Encounter (Signed)
Received another refill request from [facility] for Hydroxyzine 50 mg.  Medication was filled on 09/21/2014; but stated print.  Medication was not sent to pharmacy.  Medication resent electronically.  Derl Barrow, RN

## 2014-10-02 ENCOUNTER — Other Ambulatory Visit: Payer: Self-pay | Admitting: Family Medicine

## 2014-10-02 DIAGNOSIS — F411 Generalized anxiety disorder: Secondary | ICD-10-CM

## 2014-11-04 ENCOUNTER — Other Ambulatory Visit: Payer: Self-pay | Admitting: Family Medicine

## 2014-11-04 DIAGNOSIS — M545 Low back pain: Secondary | ICD-10-CM

## 2014-11-10 MED ORDER — IBUPROFEN 800 MG PO TABS: 800.0000 mg | ORAL_TABLET | Freq: Three times a day (TID) | ORAL | Status: DC | PRN

## 2014-11-23 ENCOUNTER — Other Ambulatory Visit: Payer: Self-pay | Admitting: *Deleted

## 2014-11-23 DIAGNOSIS — I10 Essential (primary) hypertension: Secondary | ICD-10-CM

## 2014-11-23 MED ORDER — CLONIDINE HCL 0.2 MG PO TABS: 0.2000 mg | ORAL_TABLET | Freq: Two times a day (BID) | ORAL | Status: DC

## 2014-11-30 ENCOUNTER — Other Ambulatory Visit: Payer: Self-pay | Admitting: Family Medicine

## 2014-11-30 ENCOUNTER — Telehealth: Payer: Self-pay | Admitting: Family Medicine

## 2014-11-30 DIAGNOSIS — I10 Essential (primary) hypertension: Secondary | ICD-10-CM

## 2014-11-30 MED ORDER — CLONIDINE HCL 0.2 MG PO TABS: 0.2000 mg | ORAL_TABLET | Freq: Four times a day (QID) | ORAL | Status: DC

## 2014-11-30 NOTE — Telephone Encounter (Signed)
Dosage on klonadene was changed to 2 times per day rather than 4 times per day Please advise

## 2014-11-30 NOTE — Telephone Encounter (Signed)
Patient informed, expressed understanding. 

## 2014-11-30 NOTE — Telephone Encounter (Signed)
It should still be 4 times a day, that was an error. I resent the correct dosage to her pharmacy.

## 2014-12-23 ENCOUNTER — Encounter: Payer: Self-pay | Admitting: Family Medicine

## 2014-12-23 ENCOUNTER — Ambulatory Visit (INDEPENDENT_AMBULATORY_CARE_PROVIDER_SITE_OTHER): Payer: Medicare Other | Admitting: Family Medicine

## 2014-12-23 VITALS — BP 128/89 | HR 110 | Temp 98.1°F | Ht 63.0 in | Wt 274.0 lb

## 2014-12-23 DIAGNOSIS — E785 Hyperlipidemia, unspecified: Secondary | ICD-10-CM

## 2014-12-23 DIAGNOSIS — G4700 Insomnia, unspecified: Secondary | ICD-10-CM

## 2014-12-23 DIAGNOSIS — F42 Obsessive-compulsive disorder: Secondary | ICD-10-CM | POA: Diagnosis not present

## 2014-12-23 DIAGNOSIS — E559 Vitamin D deficiency, unspecified: Secondary | ICD-10-CM | POA: Diagnosis not present

## 2014-12-23 DIAGNOSIS — F429 Obsessive-compulsive disorder, unspecified: Secondary | ICD-10-CM

## 2014-12-23 LAB — LDL CHOLESTEROL, DIRECT: Direct LDL: 165 mg/dL — ABNORMAL HIGH

## 2014-12-23 MED ORDER — RAMELTEON 8 MG PO TABS: 8.0000 mg | ORAL_TABLET | Freq: Every day | ORAL | Status: DC

## 2014-12-23 MED ORDER — TOPIRAMATE 100 MG PO TABS: 100.0000 mg | ORAL_TABLET | Freq: Two times a day (BID) | ORAL | Status: DC

## 2014-12-23 MED ORDER — CLONAZEPAM 2 MG PO TABS: 2.0000 mg | ORAL_TABLET | Freq: Three times a day (TID) | ORAL | Status: DC | PRN

## 2014-12-23 NOTE — Assessment & Plan Note (Signed)
Previously used occasional temazepam for this issue and asking for this again, uncomfortable with this given current high dose klonopin - SE from [place] and trazodone didn't work per patient - trial of ramelteon

## 2014-12-23 NOTE — Progress Notes (Signed)
   Subjective:    Patient ID: Laura Horton, female    DOB: November 24, 1975, 39 y.o.   MRN: [id number]  HPI Pt presents for f/u of vitamin D deficiency and insomnia. Reports she has been taking 1000u daily since we found her deficient in December.   Insomnia frequently over the last few weeks, has a long history of same. Reports that temazepam has worked well for this in the past. Has tried trazodone which did not work and Microbiologist which made her doing crazy things in her sleep.    Review of Systems  Psychiatric/Behavioral: Positive for sleep disturbance, dysphoric mood and decreased concentration. Negative for suicidal ideas and hallucinations. The patient is nervous/anxious.   All other systems reviewed and are negative.      Objective:   Physical Exam  Constitutional: She is oriented to person, place, and time. She appears well-developed and well-nourished. No distress.  HENT:  Head: Normocephalic and atraumatic.  Cardiovascular: Normal rate.   Pulmonary/Chest: Effort normal. No respiratory distress.  Abdominal: She exhibits no distension.  Musculoskeletal: She exhibits no edema.  Neurological: She is alert and oriented to person, place, and time.  Skin: Skin is warm and dry. No rash noted. She is not diaphoretic.  Psychiatric: She has a normal mood and affect. Her behavior is normal.  Nursing note and vitals reviewed.         Assessment & Plan:

## 2014-12-23 NOTE — Patient Instructions (Signed)
Insomnia Insomnia is frequent trouble falling and/or staying asleep. Insomnia can be a long term problem or a short term problem. Both are common. Insomnia can be a short term problem when the wakefulness is related to a certain stress or worry. Long term insomnia is often related to ongoing stress during waking hours and/or poor sleeping habits. Overtime, sleep deprivation itself can make the problem worse. Every little thing feels more severe because you are overtired and your ability to cope is decreased. CAUSES   Stress, anxiety, and depression.  Poor sleeping habits.  Distractions such as TV in the bedroom.  Naps close to bedtime.  Engaging in emotionally charged conversations before bed.  Technical reading before sleep.  Alcohol and other sedatives. They may make the problem worse. They can hurt normal sleep patterns and normal dream activity.  Stimulants such as caffeine for several hours prior to bedtime.  Pain syndromes and shortness of breath can cause insomnia.  Exercise late at night.  Changing time zones may cause sleeping problems (jet lag). It is sometimes helpful to have someone observe your sleeping patterns. They should look for periods of not breathing during the night (sleep apnea). They should also look to see how long those periods last. If you live alone or observers are uncertain, you can also be observed at a sleep clinic where your sleep patterns will be professionally monitored. Sleep apnea requires a checkup and treatment. Give your caregivers your medical history. Give your caregivers observations your family has made about your sleep.  SYMPTOMS   Not feeling rested in the morning.  Anxiety and restlessness at bedtime.  Difficulty falling and staying asleep. TREATMENT   Your caregiver may prescribe treatment for an underlying medical disorders. Your caregiver can give advice or help if you are using alcohol or other drugs for self-medication. Treatment  of underlying problems will usually eliminate insomnia problems.  Medications can be prescribed for short time use. They are generally not recommended for lengthy use.  Over-the-counter sleep medicines are not recommended for lengthy use. They can be habit forming.  You can promote easier sleeping by making lifestyle changes such as:  Using relaxation techniques that help with breathing and reduce muscle tension.  Exercising earlier in the day.  Changing your diet and the time of your last meal. No night time snacks.  Establish a regular time to go to bed.  Counseling can help with stressful problems and worry.  Soothing music and white noise may be helpful if there are background noises you cannot remove.  Stop tedious detailed work at least one hour before bedtime. HOME CARE INSTRUCTIONS   Keep a diary. Inform your caregiver about your progress. This includes any medication side effects. See your caregiver regularly. Take note of:  Times when you are asleep.  Times when you are awake during the night.  The quality of your sleep.  How you feel the next day. This information will help your caregiver care for you.  Get out of bed if you are still awake after 15 minutes. Read or do some quiet activity. Keep the lights down. Wait until you feel sleepy and go back to bed.  Keep regular sleeping and waking hours. Avoid naps.  Exercise regularly.  Avoid distractions at bedtime. Distractions include watching television or engaging in any intense or detailed activity like attempting to balance the household checkbook.  Develop a bedtime ritual. Keep a familiar routine of bathing, brushing your teeth, climbing into bed at the same   time each night, listening to soothing music. Routines increase the success of falling to sleep faster.  Use relaxation techniques. This can be using breathing and muscle tension release routines. It can also include visualizing peaceful scenes. You can  also help control troubling or intruding thoughts by keeping your mind occupied with boring or repetitive thoughts like the old concept of counting sheep. You can make it more creative like imagining planting one beautiful flower after another in your backyard garden.  During your day, work to eliminate stress. When this is not possible use some of the previous suggestions to help reduce the anxiety that accompanies stressful situations. MAKE SURE YOU:   Understand these instructions.  Will watch your condition.  Will get help right away if you are not doing well or get worse. Document Released: 08/18/2000 Document Revised: 11/13/2011 Document Reviewed: 09/18/2007 ExitCare Patient Information 2015 [employer]. This information is not intended to replace advice given to you by your health care provider. Make sure you discuss any questions you have with your health care provider.  

## 2014-12-23 NOTE — Assessment & Plan Note (Signed)
Refilled klonopin. Would really like her to see psych. May need to refuse to rx benzo's to force her to go.

## 2014-12-23 NOTE — Assessment & Plan Note (Signed)
25-H 11 in December, taking 1000u daily since then - repeat vit d level today - if still deficient will do 50,000u weekly for a short course and recheck

## 2014-12-24 ENCOUNTER — Other Ambulatory Visit: Payer: Self-pay | Admitting: *Deleted

## 2014-12-24 ENCOUNTER — Other Ambulatory Visit: Payer: Self-pay | Admitting: Family Medicine

## 2014-12-24 ENCOUNTER — Telehealth: Payer: Self-pay | Admitting: *Deleted

## 2014-12-24 DIAGNOSIS — J4541 Moderate persistent asthma with (acute) exacerbation: Secondary | ICD-10-CM

## 2014-12-24 DIAGNOSIS — J4520 Mild intermittent asthma, uncomplicated: Secondary | ICD-10-CM

## 2014-12-24 LAB — VITAMIN D 25 HYDROXY (VIT D DEFICIENCY, FRACTURES): Vit D, 25-Hydroxy: 19 ng/mL — ABNORMAL LOW (ref 30–100)

## 2014-12-24 MED ORDER — MONTELUKAST SODIUM 10 MG PO TABS: 10.0000 mg | ORAL_TABLET | Freq: Every day | ORAL | Status: DC

## 2014-12-24 MED ORDER — CHOLECALCIFEROL 1.25 MG (50000 UT) PO CAPS: 50000.0000 [IU] | ORAL_CAPSULE | ORAL | Status: DC

## 2014-12-24 NOTE — Telephone Encounter (Signed)
-----   Message from Frazier Richards, MD sent at 12/24/2014  8:24 AM EDT ----- Please let patient know that her vitamin D is better but still low. I have sent in a prescription for high dose vitamin D that I want her to take once a week for 8 weeks and then she can go back to the 1000u daily that she has. I would like to see her back to recheck again once she finished the high dose pills (in 2 months). Thanks!

## 2014-12-24 NOTE — Telephone Encounter (Signed)
Pt informed. Laura Horton, CMA  

## 2015-01-18 ENCOUNTER — Telehealth: Payer: Self-pay | Admitting: Family Medicine

## 2015-01-18 NOTE — Telephone Encounter (Signed)
Pt called because she is taking Rozerem and it is not agreeing with her. She would like to go back to her other medication. Please call when ready/ jw

## 2015-01-19 NOTE — Telephone Encounter (Signed)
Previous med was a benzo which I will not prescribe while she is on klonopin as this is dangerous.

## 2015-01-19 NOTE — Telephone Encounter (Signed)
Spoke to pt and she was talking about her vistaril. Braiden Rodman Kennon Holter, CMA

## 2015-01-20 ENCOUNTER — Other Ambulatory Visit: Payer: Self-pay | Admitting: Family Medicine

## 2015-01-20 DIAGNOSIS — F411 Generalized anxiety disorder: Secondary | ICD-10-CM

## 2015-01-20 MED ORDER — HYDROXYZINE PAMOATE 50 MG PO CAPS: 50.0000 mg | ORAL_CAPSULE | Freq: Three times a day (TID) | ORAL | Status: DC | PRN

## 2015-01-20 NOTE — Telephone Encounter (Signed)
Pt informed. Deseree Blount, CMA  

## 2015-01-20 NOTE — Telephone Encounter (Signed)
Vistaril sent to pharmacy 

## 2015-02-16 ENCOUNTER — Other Ambulatory Visit: Payer: Self-pay | Admitting: Family Medicine

## 2015-02-16 ENCOUNTER — Telehealth: Payer: Self-pay | Admitting: Family Medicine

## 2015-02-16 MED ORDER — CHOLECALCIFEROL 25 MCG (1000 UT) PO TABS: 1000.0000 [IU] | ORAL_TABLET | Freq: Every day | ORAL | Status: DC

## 2015-02-16 NOTE — Telephone Encounter (Signed)
We need to recheck her vitamin D level before continuing the high dose beyond 8 weeks.  She can continue the low dose (1000 units daily) until I see her next. I have refilled that.

## 2015-02-16 NOTE — Telephone Encounter (Signed)
Pt called needs a refill on her Cholecalciferol. jw

## 2015-02-18 NOTE — Telephone Encounter (Signed)
Pt informed. When do you want her to come in and recheck her vitamin d? Cosandra Plouffe Kennon Holter, CMA

## 2015-02-19 NOTE — Telephone Encounter (Signed)
Anytime in the next month should be fine

## 2015-02-22 ENCOUNTER — Other Ambulatory Visit: Payer: Self-pay | Admitting: Family Medicine

## 2015-02-22 DIAGNOSIS — E559 Vitamin D deficiency, unspecified: Secondary | ICD-10-CM

## 2015-02-22 NOTE — Telephone Encounter (Signed)
Future order entered.

## 2015-02-22 NOTE — Telephone Encounter (Signed)
Pt scheduled for next month. Please put in future order. Deseree Kennon Holter, CMA

## 2015-03-09 ENCOUNTER — Other Ambulatory Visit: Payer: Self-pay

## 2015-03-10 ENCOUNTER — Other Ambulatory Visit: Payer: Self-pay

## 2015-03-12 ENCOUNTER — Other Ambulatory Visit: Payer: Self-pay

## 2015-04-05 ENCOUNTER — Ambulatory Visit: Payer: Self-pay | Admitting: Family Medicine

## 2015-04-21 ENCOUNTER — Ambulatory Visit (INDEPENDENT_AMBULATORY_CARE_PROVIDER_SITE_OTHER): Payer: Medicare Other | Admitting: Family Medicine

## 2015-04-21 ENCOUNTER — Encounter: Payer: Self-pay | Admitting: Family Medicine

## 2015-04-21 VITALS — BP 122/71 | HR 86 | Temp 98.8°F | Ht 63.0 in | Wt 270.0 lb

## 2015-04-21 DIAGNOSIS — K5900 Constipation, unspecified: Secondary | ICD-10-CM

## 2015-04-21 DIAGNOSIS — F429 Obsessive-compulsive disorder, unspecified: Secondary | ICD-10-CM

## 2015-04-21 DIAGNOSIS — F42 Obsessive-compulsive disorder: Secondary | ICD-10-CM | POA: Diagnosis not present

## 2015-04-21 DIAGNOSIS — F411 Generalized anxiety disorder: Secondary | ICD-10-CM

## 2015-04-21 DIAGNOSIS — R7309 Other abnormal glucose: Secondary | ICD-10-CM

## 2015-04-21 DIAGNOSIS — E559 Vitamin D deficiency, unspecified: Secondary | ICD-10-CM

## 2015-04-21 DIAGNOSIS — R112 Nausea with vomiting, unspecified: Secondary | ICD-10-CM

## 2015-04-21 DIAGNOSIS — R7303 Prediabetes: Secondary | ICD-10-CM

## 2015-04-21 DIAGNOSIS — E669 Obesity, unspecified: Secondary | ICD-10-CM | POA: Diagnosis not present

## 2015-04-21 LAB — BASIC METABOLIC PANEL
BUN: 9 mg/dL (ref 7–25)
CHLORIDE: 103 mmol/L (ref 98–110)
CO2: 22 mmol/L (ref 20–31)
Calcium: 9.3 mg/dL (ref 8.6–10.2)
Creat: 0.88 mg/dL (ref 0.50–1.10)
GLUCOSE: 100 mg/dL — AB (ref 65–99)
POTASSIUM: 3.5 mmol/L (ref 3.5–5.3)
SODIUM: 134 mmol/L — AB (ref 135–146)

## 2015-04-21 LAB — CBC
HEMATOCRIT: 37.9 % (ref 36.0–46.0)
HEMOGLOBIN: 12.8 g/dL (ref 12.0–15.0)
MCH: 28.2 pg (ref 26.0–34.0)
MCHC: 33.8 g/dL (ref 30.0–36.0)
MCV: 83.5 fL (ref 78.0–100.0)
MPV: 9 fL (ref 8.6–12.4)
Platelets: 445 10*3/uL — ABNORMAL HIGH (ref 150–400)
RBC: 4.54 MIL/uL (ref 3.87–5.11)
RDW: 14.9 % (ref 11.5–15.5)
WBC: 7.5 10*3/uL (ref 4.0–10.5)

## 2015-04-21 LAB — LIPID PANEL
CHOL/HDL RATIO: 5.7 ratio — AB (ref <=5.0)
CHOLESTEROL: 199 mg/dL (ref 125–200)
HDL: 35 mg/dL — ABNORMAL LOW (ref >=46)
LDL CALC: 117 mg/dL (ref <130)
Triglycerides: 237 mg/dL — ABNORMAL HIGH (ref <150)
VLDL: 47 mg/dL — AB (ref <30)

## 2015-04-21 LAB — POCT GLYCOSYLATED HEMOGLOBIN (HGB A1C): Hemoglobin A1C: 6.2

## 2015-04-21 MED ORDER — POLYETHYLENE GLYCOL 3350 17 GM/SCOOP PO POWD: 17.0000 g | Freq: Two times a day (BID) | ORAL | Status: DC | PRN

## 2015-04-21 MED ORDER — ONDANSETRON HCL 4 MG PO TABS: 4.0000 mg | ORAL_TABLET | Freq: Three times a day (TID) | ORAL | Status: DC | PRN

## 2015-04-21 MED ORDER — CLONAZEPAM 2 MG PO TABS
2.0000 mg | ORAL_TABLET | Freq: Three times a day (TID) | ORAL | Status: DC | PRN
Start: 2015-04-21 — End: 2015-09-07

## 2015-04-21 MED ORDER — HYDROXYZINE PAMOATE 50 MG PO CAPS: 50.0000 mg | ORAL_CAPSULE | Freq: Three times a day (TID) | ORAL | Status: DC | PRN

## 2015-04-21 NOTE — Patient Instructions (Signed)

## 2015-04-21 NOTE — Assessment & Plan Note (Signed)
Deficient on last check, will retest to assess need for further supplementation

## 2015-04-21 NOTE — Assessment & Plan Note (Addendum)
Nausea x2-3 weeks, likely related to constipation, no obstruction, maintaining adequate hydration - miralax clean out, then prn to produce 1-2 soft stools daily - zofran for symptom management and to facilitate clean out - will check CBC, BMET to assess for other causes - f/u in 3 months or sooner if not resolving

## 2015-04-21 NOTE — Progress Notes (Signed)
Subjective:   Laura Horton is a 39 y.o. female with a history of vitamin D deficiency, mental illness and constipation here for nausea.  Patient reports that for the past several weeks she has been constantly nauseated. She can't eat anything and frequently spits up a small amount of mucus. She hasn't had any overt vomiting but thinks this is because she hasn't eaten. She has been drinking water and ginger ale. She reports a single episode of diarrhea last week but has otherwise been constipated. Having hard, difficult to pass stools every 3-4 days. She isn't taking anything for this and it is constant and not affected by eating.  Review of Systems:  Per HPI. All other systems reviewed and are negative.   PMH, PSH, Medications, Allergies, and FmHx reviewed and updated in EMR.  Social History: never smoker  Objective:  BP 122/71 mmHg  Pulse 86  Temp(Src) 98.8 F (37.1 C) (Oral)  Ht 5\' 3"  (1.6 m)  Wt 270 lb (122.471 kg)  BMI 47.84 kg/m2  Gen:  39 y.o. female in NAD HEENT: NCAT, MMM, EOMI, PERRL, anicteric sclerae CV: RRR, no MRG, no JVD Resp: Non-labored, CTAB, no wheezes noted Abd: Soft, moderate fullness consistent with large stool burden, mild tenderness in lower quadrants with no rebound or guarding, BS present, no organomegaly Ext: WWP, no edema MSK: Full ROM, strength intact Neuro: Alert and oriented, speech normal      Chemistry      Component Value Date/Time   NA 138 10/29/2013 1138   K 3.9 10/29/2013 1138   CL 107 10/29/2013 1138   CO2 23 10/29/2013 1138   BUN 10 10/29/2013 1138   CREATININE 0.94 10/29/2013 1138   CREATININE 1.07 03/01/2010 1851      Component Value Date/Time   CALCIUM 9.2 10/29/2013 1138   ALKPHOS 95 10/29/2013 1138   AST 21 10/29/2013 1138   ALT 31 10/29/2013 1138   BILITOT 0.2 10/29/2013 1138      Lab Results  Component Value Date   WBC 6.5 10/29/2013   HGB 13.2 10/29/2013   HCT 39.1 10/29/2013   MCV 84.6 10/29/2013   PLT 478* 10/29/2013   Lab Results  Component Value Date   TSH 3.320 10/29/2013   Lab Results  Component Value Date   HGBA1C 6.2 04/21/2015   Assessment:     Laura Horton is a 39 y.o. female here for nausea    Plan:     Problem List Items Addressed This Visit      Unprioritized   Nausea with vomiting - Primary    Nausea x2-3 weeks, likely related to constipation, no obstruction, maintaining adequate hydration - miralax clean out, then prn to produce 1-2 soft stools daily - zofran for symptom management and to facilitate clean out - f/u in 3 months or sooner if not resolving      Relevant Medications   ondansetron (ZOFRAN) 4 MG tablet   Other Relevant Orders   Basic Metabolic Panel   CBC   Obesity   Relevant Orders   Lipid panel   HgB A1c (Completed)   Vitamin D deficiency    Deficient on last check, will retest to assess need for further supplementation      Relevant Orders   Vit D  25 hydroxy (rtn osteoporosis monitoring)    Other Visit Diagnoses    Constipation, unspecified constipation type        Relevant Medications    polyethylene glycol powder (GLYCOLAX/MIRALAX) powder  Anxiety state        Relevant Medications    hydrOXYzine (VISTARIL) 50 MG capsule    OCD (obsessive compulsive disorder)        Relevant Medications    clonazePAM (KLONOPIN) 2 MG tablet         Frazier Richards, MD PGY-3,  [facility] 04/21/2015  11:24 AM

## 2015-04-22 ENCOUNTER — Telehealth: Payer: Self-pay | Admitting: *Deleted

## 2015-04-22 LAB — VITAMIN D 25 HYDROXY (VIT D DEFICIENCY, FRACTURES): VIT D 25 HYDROXY: 34 ng/mL (ref 30–100)

## 2015-04-22 NOTE — Telephone Encounter (Signed)
Pt informed. Rayhan Groleau, CMA  

## 2015-04-22 NOTE — Telephone Encounter (Signed)
-----   Message from Frazier Richards, MD sent at 04/22/2015 11:10 AM EDT ----- Please inform patient that her vitamin D is now normal. She will not need any more high dose treatment but should keep taking 1000u daily to support bone health and keep her level from dropping again. Her other labs were normal. Thanks

## 2015-04-29 ENCOUNTER — Telehealth: Payer: Self-pay | Admitting: Family Medicine

## 2015-04-29 NOTE — Telephone Encounter (Signed)
Pt has been having uncontrollable diarrhea since starting miralax.  Spoke with Dr. Mingo Amber, she is to stop miralax and go to ED or UC.  Explained to her about possible dehydration.  Pt expresses understanding and will get dressed and go. [place], Salome Spotted

## 2015-04-29 NOTE — Telephone Encounter (Signed)
Pt explains that she has started a new medication, polyethylene glycol powder, and she states that she is experiencing "extremely low blood pressure". One reading was 94/64 and the second reading was 87/70. She states that her pulse was in the "70's" and she feels very weak and dizzy. She would like to talk to a nurse about these symptoms. Please advise at the earliest convenience. Laura Horton, ASA

## 2015-05-11 ENCOUNTER — Other Ambulatory Visit: Payer: Self-pay | Admitting: Family Medicine

## 2015-05-11 DIAGNOSIS — R112 Nausea with vomiting, unspecified: Secondary | ICD-10-CM

## 2015-05-11 NOTE — Telephone Encounter (Signed)
Pt is calling to request a refill on ondansetron (ZOFRAN) 4 MG tablet. [name], ASA

## 2015-05-12 MED ORDER — ONDANSETRON HCL 4 MG PO TABS: 4.0000 mg | ORAL_TABLET | Freq: Three times a day (TID) | ORAL | Status: DC | PRN

## 2015-05-17 ENCOUNTER — Telehealth: Payer: Self-pay | Admitting: Family Medicine

## 2015-05-17 NOTE — Telephone Encounter (Signed)
Pt says she started taking miralax and it caused her bp to drop, she stopped but is still having problems with constipation and feeling nauseous, wants to know what Dr. Sherril Cong wants her to do. The new medication called in a few days ago and its not helping.

## 2015-05-19 MED ORDER — DICYCLOMINE HCL 20 MG PO TABS: 20.0000 mg | ORAL_TABLET | Freq: Three times a day (TID) | ORAL | Status: DC

## 2015-05-19 NOTE — Telephone Encounter (Signed)
Sent in script for dicyclomine. Would like to see her back in 2 weeks to reassess. Thanks!

## 2015-05-19 NOTE — Telephone Encounter (Signed)
Pt called because she is still having problems with her bowel movements. Her mother gave her some Dicyclomine to take and she said that this helps some. She would like a prescription called in. jw

## 2015-05-20 NOTE — Telephone Encounter (Signed)
Patient informed, appointment scheduled for 2 weeks.

## 2015-05-28 ENCOUNTER — Other Ambulatory Visit: Payer: Self-pay | Admitting: Family Medicine

## 2015-06-07 ENCOUNTER — Ambulatory Visit: Payer: Self-pay | Admitting: Family Medicine

## 2015-06-24 ENCOUNTER — Other Ambulatory Visit: Payer: Self-pay | Admitting: Family Medicine

## 2015-06-24 DIAGNOSIS — M159 Polyosteoarthritis, unspecified: Secondary | ICD-10-CM

## 2015-06-24 DIAGNOSIS — M150 Primary generalized (osteo)arthritis: Principal | ICD-10-CM

## 2015-06-26 ENCOUNTER — Other Ambulatory Visit (HOSPITAL_COMMUNITY): Payer: Self-pay | Admitting: Psychiatry

## 2015-06-29 ENCOUNTER — Other Ambulatory Visit: Payer: Self-pay | Admitting: *Deleted

## 2015-06-29 DIAGNOSIS — F4001 Agoraphobia with panic disorder: Secondary | ICD-10-CM

## 2015-06-29 NOTE — Telephone Encounter (Signed)
Received medication request from [place] for Lexapro 20mg . Per Dr. De Nurse, medication request is denied. Pt was last seen 06/09/14 and will need to schedule an appt with the office.

## 2015-06-30 NOTE — Telephone Encounter (Signed)
2nd request.  Martin, Laura L, RN  

## 2015-07-02 NOTE — Telephone Encounter (Signed)
3rd request. Martin, Tamika L, RN  

## 2015-07-05 MED ORDER — OMEPRAZOLE 20 MG PO CPDR: 20.0000 mg | DELAYED_RELEASE_CAPSULE | Freq: Every day | ORAL | Status: AC | PRN

## 2015-07-08 ENCOUNTER — Ambulatory Visit: Payer: Self-pay | Admitting: Family Medicine

## 2015-07-21 ENCOUNTER — Ambulatory Visit: Payer: Self-pay | Admitting: Family Medicine

## 2015-08-24 ENCOUNTER — Ambulatory Visit: Payer: Self-pay | Admitting: Family Medicine

## 2015-08-26 ENCOUNTER — Ambulatory Visit: Payer: Self-pay | Admitting: Family Medicine

## 2015-09-07 ENCOUNTER — Encounter: Payer: Self-pay | Admitting: Family Medicine

## 2015-09-07 ENCOUNTER — Ambulatory Visit (INDEPENDENT_AMBULATORY_CARE_PROVIDER_SITE_OTHER): Payer: Medicare Other | Admitting: Family Medicine

## 2015-09-07 ENCOUNTER — Telehealth: Payer: Self-pay | Admitting: Family Medicine

## 2015-09-07 VITALS — BP 132/90 | HR 110 | Temp 98.2°F | Ht 63.0 in | Wt 284.2 lb

## 2015-09-07 DIAGNOSIS — R030 Elevated blood-pressure reading, without diagnosis of hypertension: Secondary | ICD-10-CM

## 2015-09-07 DIAGNOSIS — F3170 Bipolar disorder, currently in remission, most recent episode unspecified: Secondary | ICD-10-CM

## 2015-09-07 DIAGNOSIS — IMO0001 Reserved for inherently not codable concepts without codable children: Secondary | ICD-10-CM | POA: Insufficient documentation

## 2015-09-07 MED ORDER — ESCITALOPRAM OXALATE 20 MG PO TABS: 20.0000 mg | ORAL_TABLET | Freq: Every day | ORAL | Status: DC

## 2015-09-07 MED ORDER — CLONIDINE HCL 0.2 MG PO TABS: 0.2000 mg | ORAL_TABLET | Freq: Four times a day (QID) | ORAL | Status: AC

## 2015-09-07 MED ORDER — CLONAZEPAM 1 MG PO TABS: 1.0000 mg | ORAL_TABLET | Freq: Three times a day (TID) | ORAL | Status: DC | PRN

## 2015-09-07 NOTE — Telephone Encounter (Signed)
Has been taking 2 mg of clonzepam 3 times a day. Rx recevied today was for 1 mg. Please advise

## 2015-09-07 NOTE — Telephone Encounter (Signed)
I want her on 1mg  until the appt with psych, especially to decrease her risk of withdrawal. She can discuss with them whether she needs to increase the dose.

## 2015-09-07 NOTE — Patient Instructions (Signed)
I think it is very important that you establish with a psychiatrist and I do not think it is appropriate for me to start you back on adderall without their input.

## 2015-09-09 NOTE — Assessment & Plan Note (Signed)
Usually normotensive, elevated today to 197/100, repeat was 132/90, possible related to klonopin withdrawal or missed clonidine dose - meds refilled and stressed dangers of stopping these meds unsupervised - f/u in 1 week to recheck bp

## 2015-09-09 NOTE — Assessment & Plan Note (Signed)
Depressed mood, stopped depakote d/t headaches which have gotten better does not see psych - pt plans to establish at [facility], encouraged her to do that this week - refill klonopin at lower dose, 1mg  tid prn, not to continue long term without psych supervision - stay off depakote for now given SE but will likely need to start another mood stabilizer when she sees psych - continue lexapro and hydroxyzine - not currently taking antipsychotic, stopped months ago because she didn't think it was helping - also wants to restart stimulants for ADHD but told her she would need to discuss this with psych given her other mental health issues

## 2015-09-09 NOTE — Progress Notes (Signed)
   Subjective:   Laura Horton is a 40 y.o. female with a history of bipolar, ocd, adhd, panic disorder here for med management  Pt reports she is not currently taking depakote because of headaches. She stopped 2 weeks ago and they have gotten better since. Denies racing thoughts or other manic behaviors. Does feel depressed but says she always does and this has not changed.   Review of Systems:  Per HPI. All other systems reviewed and are negative.   PMH, PSH, Medications, Allergies, and FmHx reviewed and updated in EMR.  Social History: never smoker  Objective:  BP 132/90 mmHg  Pulse 110  Temp(Src) 98.2 F (36.8 C) (Oral)  Ht 5\' 3"  (1.6 m)  Wt 284 lb 3.2 oz (128.912 kg)  BMI 50.36 kg/m2  LMP 09/05/2015  Gen:  40 y.o. female in NAD HEENT: NCAT, MMM, EOMI, PERRL, anicteric sclerae CV: RRR, no MRG, no JVD Resp: Non-labored, CTAB, no wheezes noted Abd: Soft, NTND, BS present, no guarding or organomegaly Ext: WWP, no edema MSK: Full ROM, strength intact Neuro: Alert and oriented, speech normal, depressed affect      Chemistry      Component Value Date/Time   NA 134* 04/21/2015 1035   K 3.5 04/21/2015 1035   CL 103 04/21/2015 1035   CO2 22 04/21/2015 1035   BUN 9 04/21/2015 1035   CREATININE 0.88 04/21/2015 1035   CREATININE 1.07 03/01/2010 1851      Component Value Date/Time   CALCIUM 9.3 04/21/2015 1035   ALKPHOS 95 10/29/2013 1138   AST 21 10/29/2013 1138   ALT 31 10/29/2013 1138   BILITOT 0.2 10/29/2013 1138      Lab Results  Component Value Date   WBC 7.5 04/21/2015   HGB 12.8 04/21/2015   HCT 37.9 04/21/2015   MCV 83.5 04/21/2015   PLT 445* 04/21/2015   Lab Results  Component Value Date   TSH 3.320 10/29/2013   Lab Results  Component Value Date   HGBA1C 6.2 04/21/2015   Assessment & Plan:     Laura Horton is a 40 y.o. female here for meds/psych f/u  Elevated blood pressure Usually normotensive, elevated today to 197/100,  repeat was 132/90, possible related to klonopin withdrawal or missed clonidine dose - meds refilled and stressed dangers of stopping these meds unsupervised - f/u in 1 week to recheck bp   Bipolar disorder Depressed mood, stopped depakote d/t headaches which have gotten better does not see psych - pt plans to establish at [facility], encouraged her to do that this week - refill klonopin at lower dose, 1mg  tid prn, not to continue long term without psych supervision - stay off depakote for now given SE but will likely need to start another mood stabilizer when she sees psych - continue lexapro and hydroxyzine - not currently taking antipsychotic, stopped months ago because she didn't think it was helping - also wants to restart stimulants for ADHD but told her she would need to discuss this with psych given her other mental health issues      Beverlyn Roux, MD, MPH [facility] PGY-3 09/09/2015 11:08 AM

## 2015-09-14 ENCOUNTER — Ambulatory Visit: Payer: Self-pay | Admitting: Family Medicine

## 2015-10-28 ENCOUNTER — Telehealth: Payer: Self-pay | Admitting: Family Medicine

## 2015-10-28 NOTE — Telephone Encounter (Signed)
The last A1c I got was in the prediabetic range, if she has progressed to diabetes since then I have no way of knowing that.

## 2015-10-28 NOTE — Telephone Encounter (Signed)
Patient found out from another provider that she is diabetic and was dx'd previously by [place] as prediabetic.  She is upset that she was never told by her prmary about this.

## 2015-11-25 DIAGNOSIS — R945 Abnormal results of liver function studies: Secondary | ICD-10-CM

## 2015-11-25 DIAGNOSIS — R7989 Other specified abnormal findings of blood chemistry: Secondary | ICD-10-CM | POA: Insufficient documentation

## 2015-12-29 ENCOUNTER — Ambulatory Visit: Payer: Medicare Other | Admitting: Family Medicine

## 2015-12-30 ENCOUNTER — Emergency Department (HOSPITAL_COMMUNITY)
Admission: EM | Admit: 2015-12-30 | Discharge: 2015-12-30 | Disposition: A | Discharge status: Home / Self Care | Payer: Medicare Other | Attending: Emergency Medicine | Admitting: Emergency Medicine

## 2015-12-30 ENCOUNTER — Encounter (HOSPITAL_COMMUNITY): Payer: Self-pay | Admitting: Emergency Medicine

## 2015-12-30 ENCOUNTER — Emergency Department (HOSPITAL_COMMUNITY): Payer: Medicare Other

## 2015-12-30 DIAGNOSIS — Y9389 Activity, other specified: Secondary | ICD-10-CM | POA: Insufficient documentation

## 2015-12-30 DIAGNOSIS — S3992XA Unspecified injury of lower back, initial encounter: Secondary | ICD-10-CM | POA: Insufficient documentation

## 2015-12-30 DIAGNOSIS — S4992XA Unspecified injury of left shoulder and upper arm, initial encounter: Secondary | ICD-10-CM | POA: Diagnosis not present

## 2015-12-30 DIAGNOSIS — Z79899 Other long term (current) drug therapy: Secondary | ICD-10-CM | POA: Insufficient documentation

## 2015-12-30 DIAGNOSIS — Z7951 Long term (current) use of inhaled steroids: Secondary | ICD-10-CM | POA: Diagnosis not present

## 2015-12-30 DIAGNOSIS — F319 Bipolar disorder, unspecified: Secondary | ICD-10-CM | POA: Diagnosis not present

## 2015-12-30 DIAGNOSIS — F419 Anxiety disorder, unspecified: Secondary | ICD-10-CM | POA: Insufficient documentation

## 2015-12-30 DIAGNOSIS — S199XXA Unspecified injury of neck, initial encounter: Secondary | ICD-10-CM | POA: Insufficient documentation

## 2015-12-30 DIAGNOSIS — E119 Type 2 diabetes mellitus without complications: Secondary | ICD-10-CM | POA: Diagnosis not present

## 2015-12-30 DIAGNOSIS — Y998 Other external cause status: Secondary | ICD-10-CM | POA: Insufficient documentation

## 2015-12-30 DIAGNOSIS — M25512 Pain in left shoulder: Secondary | ICD-10-CM

## 2015-12-30 DIAGNOSIS — Y92410 Unspecified street and highway as the place of occurrence of the external cause: Secondary | ICD-10-CM | POA: Insufficient documentation

## 2015-12-30 DIAGNOSIS — S8992XA Unspecified injury of left lower leg, initial encounter: Secondary | ICD-10-CM | POA: Diagnosis present

## 2015-12-30 DIAGNOSIS — G8929 Other chronic pain: Secondary | ICD-10-CM | POA: Insufficient documentation

## 2015-12-30 DIAGNOSIS — S8002XA Contusion of left knee, initial encounter: Secondary | ICD-10-CM | POA: Diagnosis not present

## 2015-12-30 HISTORY — DX: Type 2 diabetes mellitus without complications: E11.9

## 2015-12-30 MED ORDER — METHOCARBAMOL 500 MG PO TABS: 500.0000 mg | ORAL_TABLET | Freq: Three times a day (TID) | ORAL | Status: DC | PRN

## 2015-12-30 MED ORDER — DICLOFENAC SODIUM 50 MG PO TBEC: 50.0000 mg | DELAYED_RELEASE_TABLET | Freq: Two times a day (BID) | ORAL | Status: DC
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
## 2015-12-30 NOTE — ED Notes (Signed)
See PAs notes for secondary assessment.  
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
## 2015-12-30 NOTE — Discharge Instructions (Signed)
Contusion °A contusion is a deep bruise. Contusions are the result of a blunt injury to tissues and muscle fibers under the skin. The injury causes bleeding under the skin. The skin overlying the contusion may turn blue, purple, or yellow. Minor injuries will give you a painless contusion, but more severe contusions may stay painful and swollen for a few weeks.  °CAUSES  °This condition is usually caused by a blow, trauma, or direct force to an area of the body. °SYMPTOMS  °Symptoms of this condition include: °· Swelling of the injured area. °· Pain and tenderness in the injured area. °· Discoloration. The area may have redness and then turn blue, purple, or yellow. °DIAGNOSIS  °This condition is diagnosed based on a physical exam and medical history. An X-ray, CT scan, or MRI may be needed to determine if there are any associated injuries, such as broken bones (fractures). °TREATMENT  °Specific treatment for this condition depends on what area of the body was injured. In general, the best treatment for a contusion is resting, icing, applying pressure to (compression), and elevating the injured area. This is often called the RICE strategy. Over-the-counter anti-inflammatory medicines may also be recommended for pain control.  °HOME CARE INSTRUCTIONS  °· Rest the injured area. °· If directed, apply ice to the injured area: °· Put ice in a plastic bag. °· Place a towel between your skin and the bag. °· Leave the ice on for 20 minutes, 2-3 times per day. °· If directed, apply light compression to the injured area using an elastic bandage. Make sure the bandage is not wrapped too tightly. Remove and reapply the bandage as directed by your health care provider. °· If possible, raise (elevate) the injured area above the level of your heart while you are sitting or lying down. °· Take over-the-counter and prescription medicines only as told by your health care provider. °SEEK MEDICAL CARE IF: °· Your symptoms do not  improve after several days of treatment. °· Your symptoms get worse. °· You have difficulty moving the injured area. °SEEK IMMEDIATE MEDICAL CARE IF:  °· You have severe pain. °· You have numbness in a hand or foot. °· Your hand or foot turns pale or cold. °  °This information is not intended to replace advice given to you by your health care provider. Make sure you discuss any questions you have with your health care provider. °  °Document Released: 05/31/2005 Document Revised: 05/12/2015 Document Reviewed: 01/06/2015 °Elsevier Interactive Patient Education ©2016 [employer] ° °Motor Vehicle Collision °It is common to have multiple bruises and sore muscles after a motor vehicle collision (MVC). These tend to feel worse for the first 24 hours. You may have the most stiffness and soreness over the first several hours. You may also feel worse when you wake up the first morning after your collision. After this point, you will usually begin to improve with each day. The speed of improvement often depends on the severity of the collision, the number of injuries, and the location and nature of these injuries. °HOME CARE INSTRUCTIONS °· Put ice on the injured area. °¨ Put ice in a plastic bag. °¨ Place a towel between your skin and the bag. °¨ Leave the ice on for 15-20 minutes, 3-4 times a day, or as directed by your health care provider. °· Drink enough fluids to keep your urine clear or pale yellow. Do not drink alcohol. °· Take a warm shower or bath once or twice a   day. This will increase blood flow to sore muscles. °· You may return to activities as directed by your caregiver. Be careful when lifting, as this may aggravate neck or back pain. °· Only take over-the-counter or prescription medicines for pain, discomfort, or fever as directed by your caregiver. Do not use aspirin. This may increase bruising and bleeding. °SEEK IMMEDIATE MEDICAL CARE IF: °· You have numbness, tingling, or weakness in the arms or  legs. °· You develop severe headaches not relieved with medicine. °· You have severe neck pain, especially tenderness in the middle of the back of your neck. °· You have changes in bowel or bladder control. °· There is increasing pain in any area of the body. °· You have shortness of breath, light-headedness, dizziness, or fainting. °· You have chest pain. °· You feel sick to your stomach (nauseous), throw up (vomit), or sweat. °· You have increasing abdominal discomfort. °· There is blood in your urine, stool, or vomit. °· You have pain in your shoulder (shoulder strap areas). °· You feel your symptoms are getting worse. °MAKE SURE YOU: °· Understand these instructions. °· Will watch your condition. °· Will get help right away if you are not doing well or get worse. °  °This information is not intended to replace advice given to you by your health care provider. Make sure you discuss any questions you have with your health care provider. °  °Document Released: 08/21/2005 Document Revised: 09/11/2014 Document Reviewed: 01/18/2011 °Elsevier Interactive Patient Education ©2016 [employer] ° °

## 2015-12-30 NOTE — ED Notes (Signed)
Pt A&OX4, ambulatory at d/c with steady gait, NAD 

## 2015-12-31 ENCOUNTER — Encounter (HOSPITAL_COMMUNITY): Payer: Self-pay

## 2015-12-31 ENCOUNTER — Observation Stay (HOSPITAL_BASED_OUTPATIENT_CLINIC_OR_DEPARTMENT_OTHER): Payer: Medicare Other

## 2015-12-31 ENCOUNTER — Observation Stay (HOSPITAL_COMMUNITY)
Admission: EM | Admit: 2015-12-31 | Discharge: 2016-01-01 | Disposition: A | Discharge status: Home / Self Care | Payer: Medicare Other | Attending: Internal Medicine | Admitting: Internal Medicine

## 2015-12-31 ENCOUNTER — Emergency Department (HOSPITAL_COMMUNITY): Payer: Medicare Other

## 2015-12-31 DIAGNOSIS — Z6841 Body Mass Index (BMI) 40.0 and over, adult: Secondary | ICD-10-CM | POA: Diagnosis not present

## 2015-12-31 DIAGNOSIS — I10 Essential (primary) hypertension: Secondary | ICD-10-CM | POA: Diagnosis present

## 2015-12-31 DIAGNOSIS — F319 Bipolar disorder, unspecified: Secondary | ICD-10-CM | POA: Diagnosis present

## 2015-12-31 DIAGNOSIS — R55 Syncope and collapse: Secondary | ICD-10-CM | POA: Diagnosis not present

## 2015-12-31 DIAGNOSIS — E119 Type 2 diabetes mellitus without complications: Secondary | ICD-10-CM

## 2015-12-31 DIAGNOSIS — F419 Anxiety disorder, unspecified: Secondary | ICD-10-CM | POA: Diagnosis not present

## 2015-12-31 DIAGNOSIS — F909 Attention-deficit hyperactivity disorder, unspecified type: Secondary | ICD-10-CM | POA: Diagnosis not present

## 2015-12-31 DIAGNOSIS — Z7984 Long term (current) use of oral hypoglycemic drugs: Secondary | ICD-10-CM | POA: Diagnosis not present

## 2015-12-31 DIAGNOSIS — R4020 Unspecified coma: Secondary | ICD-10-CM

## 2015-12-31 LAB — URINE MICROSCOPIC-ADD ON

## 2015-12-31 LAB — ECHOCARDIOGRAM COMPLETE
HEIGHTINCHES: 64 in
Weight: 4185.6 oz

## 2015-12-31 LAB — URINALYSIS, ROUTINE W REFLEX MICROSCOPIC
Bilirubin Urine: NEGATIVE
Glucose, UA: NEGATIVE mg/dL
Hgb urine dipstick: NEGATIVE
Ketones, ur: NEGATIVE mg/dL
Nitrite: NEGATIVE
Protein, ur: NEGATIVE mg/dL
Specific Gravity, Urine: 1.01 (ref 1.005–1.030)
pH: 5.5 (ref 5.0–8.0)

## 2015-12-31 LAB — CBC
HCT: 35.9 % — ABNORMAL LOW (ref 36.0–46.0)
HEMATOCRIT: 33.4 % — AB (ref 36.0–46.0)
HEMOGLOBIN: 11.1 g/dL — AB (ref 12.0–15.0)
Hemoglobin: 11.9 g/dL — ABNORMAL LOW (ref 12.0–15.0)
MCH: 27.4 pg (ref 26.0–34.0)
MCH: 27.5 pg (ref 26.0–34.0)
MCHC: 33.1 g/dL (ref 30.0–36.0)
MCHC: 33.2 g/dL (ref 30.0–36.0)
MCV: 82.7 fL (ref 78.0–100.0)
MCV: 82.9 fL (ref 78.0–100.0)
Platelets: 490 10*3/uL — ABNORMAL HIGH (ref 150–400)
Platelets: 420 10*3/uL — ABNORMAL HIGH (ref 150–400)
RBC: 4.34 MIL/uL (ref 3.87–5.11)
RBC: 4.03 MIL/uL (ref 3.87–5.11)
RDW: 16.2 % — ABNORMAL HIGH (ref 11.5–15.5)
RDW: 16.2 % — ABNORMAL HIGH (ref 11.5–15.5)
WBC: 8.3 10*3/uL (ref 4.0–10.5)
WBC: 7.8 10*3/uL (ref 4.0–10.5)

## 2015-12-31 LAB — BASIC METABOLIC PANEL
Anion gap: 12 (ref 5–15)
BUN: 10 mg/dL (ref 6–20)
CO2: 21 mmol/L — ABNORMAL LOW (ref 22–32)
Calcium: 9.2 mg/dL (ref 8.9–10.3)
Chloride: 106 mmol/L (ref 101–111)
Creatinine, Ser: 0.96 mg/dL (ref 0.44–1.00)
GFR calc Af Amer: >60 mL/min (ref >60)
GFR calc non Af Amer: >60 mL/min (ref >60)
Glucose, Bld: 102 mg/dL — ABNORMAL HIGH (ref 65–99)
Potassium: 3.6 mmol/L (ref 3.5–5.1)
Sodium: 139 mmol/L (ref 135–145)

## 2015-12-31 LAB — TROPONIN I

## 2015-12-31 LAB — CREATININE, SERUM
Creatinine, Ser: 0.91 mg/dL (ref 0.44–1.00)
GFR calc Af Amer: >60 mL/min (ref >60)

## 2015-12-31 LAB — I-STAT BETA HCG BLOOD, ED (MC, WL, AP ONLY): I-stat hCG, quantitative: <5 m[IU]/mL (ref <5)

## 2015-12-31 LAB — GLUCOSE, CAPILLARY
GLUCOSE-CAPILLARY: 82 mg/dL (ref 65–99)
Glucose-Capillary: 172 mg/dL — ABNORMAL HIGH (ref 65–99)

## 2015-12-31 LAB — CBG MONITORING, ED: Glucose-Capillary: 101 mg/dL — ABNORMAL HIGH (ref 65–99)

## 2015-12-31 LAB — D-DIMER, QUANTITATIVE (NOT AT ARMC): D DIMER QUANT: 0.55 ug{FEU}/mL — AB (ref 0.00–0.50)

## 2015-12-31 MED ORDER — FLUTICASONE PROPIONATE 50 MCG/ACT NA SUSP
1.0000 | Freq: Every day | NASAL | Status: DC
  Administered 2015-12-31 – 2016-01-01 (×2): 1 via NASAL
  Filled 2015-12-31: qty 16

## 2015-12-31 MED ORDER — ONDANSETRON HCL 4 MG PO TABS: 4.0000 mg | ORAL_TABLET | Freq: Four times a day (QID) | ORAL | Status: DC | PRN

## 2015-12-31 MED ORDER — CLONAZEPAM 1 MG PO TABS
1.0000 mg | ORAL_TABLET | Freq: Three times a day (TID) | ORAL | Status: DC | PRN
  Administered 2015-12-31 – 2016-01-01 (×2): 1 mg via ORAL
  Filled 2015-12-31: qty 1

## 2015-12-31 MED ORDER — SODIUM CHLORIDE 0.9% FLUSH: 3.0000 mL | Freq: Two times a day (BID) | INTRAVENOUS | Status: DC

## 2015-12-31 MED ORDER — SODIUM CHLORIDE 0.9 % IV SOLN
INTRAVENOUS | Status: DC
  Administered 2015-12-31 (×2): via INTRAVENOUS

## 2015-12-31 MED ORDER — ONDANSETRON HCL 4 MG/2ML IJ SOLN: 4.0000 mg | Freq: Four times a day (QID) | INTRAMUSCULAR | Status: DC | PRN

## 2015-12-31 MED ORDER — IOPAMIDOL (ISOVUE-370) INJECTION 76%
100.0000 mL | Freq: Once | INTRAVENOUS | Status: AC | PRN
  Administered 2015-12-31: 100 mL via INTRAVENOUS

## 2015-12-31 MED ORDER — CLONIDINE HCL 0.2 MG PO TABS
0.2000 mg | ORAL_TABLET | Freq: Three times a day (TID) | ORAL | Status: DC
  Administered 2015-12-31 – 2016-01-01 (×2): 0.2 mg via ORAL
  Filled 2015-12-31 (×5): qty 1

## 2015-12-31 MED ORDER — ENOXAPARIN SODIUM 40 MG/0.4ML ~~LOC~~ SOLN
40.0000 mg | SUBCUTANEOUS | Status: DC
Start: 2015-12-31 — End: 2016-01-01
  Administered 2015-12-31: 40 mg via SUBCUTANEOUS
  Filled 2015-12-31 (×2): qty 0.4

## 2015-12-31 MED ORDER — INSULIN ASPART 100 UNIT/ML ~~LOC~~ SOLN: 0.0000 [IU] | Freq: Every day | SUBCUTANEOUS | Status: DC

## 2015-12-31 MED ORDER — ACETAMINOPHEN 325 MG PO TABS: 650.0000 mg | ORAL_TABLET | Freq: Four times a day (QID) | ORAL | Status: DC | PRN

## 2015-12-31 MED ORDER — SODIUM CHLORIDE 0.9 % IV BOLUS (SEPSIS)
1000.0000 mL | Freq: Once | INTRAVENOUS | Status: AC
  Administered 2015-12-31: 1000 mL via INTRAVENOUS

## 2015-12-31 MED ORDER — ACETAMINOPHEN 650 MG RE SUPP: 650.0000 mg | Freq: Four times a day (QID) | RECTAL | Status: DC | PRN

## 2015-12-31 MED ORDER — OXYCODONE HCL 5 MG PO TABS
5.0000 mg | ORAL_TABLET | Freq: Four times a day (QID) | ORAL | Status: DC | PRN
  Administered 2015-12-31 – 2016-01-01 (×3): 5 mg via ORAL
  Filled 2015-12-31 (×3): qty 1

## 2015-12-31 MED ORDER — INSULIN ASPART 100 UNIT/ML ~~LOC~~ SOLN
0.0000 [IU] | Freq: Three times a day (TID) | SUBCUTANEOUS | Status: DC
  Administered 2015-12-31: 3 [IU] via SUBCUTANEOUS
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
## 2015-12-31 NOTE — Progress Notes (Signed)
Assumed care, agree with previous RN's assessment. 

## 2015-12-31 NOTE — Progress Notes (Signed)
  Echocardiogram 2D Echocardiogram has been performed.  Laura Horton 12/31/2015, 1:29 PM

## 2015-12-31 NOTE — ED Notes (Signed)
Pt ambulated to bathroom with assistance.

## 2015-12-31 NOTE — Progress Notes (Signed)
Patient refuses blood products
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
## 2015-12-31 NOTE — Care Management Note (Signed)
Case Management Note  Patient Details  Name: Laura Horton MRN: [id number] Date of Birth: 09-24-1975  Subjective/Objective:  40 y/o f admitted w/syncope. From home.                  Action/Plan:d/c plan home.   Expected Discharge Date:   (UNKNOWN)               Expected Discharge Plan:  Home/Self Care  In-House Referral:     Discharge planning Services  CM Consult  Post Acute Care Choice:    Choice offered to:     DME Arranged:    DME Agency:     HH Arranged:    HH Agency:     Status of Service:  In process, will continue to follow  Medicare Important Message Given:    Date Medicare IM Given:    Medicare IM give by:    Date Additional Medicare IM Given:    Additional Medicare Important Message give by:     If discussed at [place] of Stay Meetings, dates discussed:    Additional Comments:  Dessa Phi, RN 12/31/2015, 3:20 PM

## 2015-12-31 NOTE — ED Notes (Signed)
Pt states she was incontinent of urine during the second episode of loss of consciousness.
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
## 2016-01-01 DIAGNOSIS — I10 Essential (primary) hypertension: Secondary | ICD-10-CM | POA: Diagnosis not present

## 2016-01-01 DIAGNOSIS — R55 Syncope and collapse: Secondary | ICD-10-CM | POA: Diagnosis not present

## 2016-01-01 DIAGNOSIS — F3170 Bipolar disorder, currently in remission, most recent episode unspecified: Secondary | ICD-10-CM

## 2016-01-01 LAB — BASIC METABOLIC PANEL
Anion gap: 8 (ref 5–15)
BUN: 9 mg/dL (ref 6–20)
CHLORIDE: 110 mmol/L (ref 101–111)
CO2: 24 mmol/L (ref 22–32)
CREATININE: 0.88 mg/dL (ref 0.44–1.00)
Calcium: 8.8 mg/dL — ABNORMAL LOW (ref 8.9–10.3)
GFR calc Af Amer: >60 mL/min (ref >60)
GFR calc non Af Amer: >60 mL/min (ref >60)
Glucose, Bld: 91 mg/dL (ref 65–99)
POTASSIUM: 3.5 mmol/L (ref 3.5–5.1)
SODIUM: 142 mmol/L (ref 135–145)

## 2016-01-01 LAB — CBC
HEMATOCRIT: 33.6 % — AB (ref 36.0–46.0)
Hemoglobin: 10.9 g/dL — ABNORMAL LOW (ref 12.0–15.0)
MCH: 26.9 pg (ref 26.0–34.0)
MCHC: 32.4 g/dL (ref 30.0–36.0)
MCV: 83 fL (ref 78.0–100.0)
Platelets: 405 10*3/uL — ABNORMAL HIGH (ref 150–400)
RBC: 4.05 MIL/uL (ref 3.87–5.11)
RDW: 16.1 % — AB (ref 11.5–15.5)
WBC: 6.7 10*3/uL (ref 4.0–10.5)

## 2016-01-01 LAB — TROPONIN I: Troponin I: <0.03 ng/mL (ref <0.031)

## 2016-01-01 LAB — GLUCOSE, CAPILLARY: Glucose-Capillary: 69 mg/dL (ref 65–99)
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
## 2016-01-04 ENCOUNTER — Encounter: Payer: Self-pay | Admitting: Family Medicine

## 2016-01-04 ENCOUNTER — Ambulatory Visit (INDEPENDENT_AMBULATORY_CARE_PROVIDER_SITE_OTHER): Payer: Medicare Other | Admitting: Family Medicine

## 2016-01-04 VITALS — BP 119/78 | HR 63 | Ht 63.0 in | Wt 259.0 lb

## 2016-01-04 DIAGNOSIS — M25562 Pain in left knee: Secondary | ICD-10-CM | POA: Diagnosis not present

## 2016-01-04 MED ORDER — MELOXICAM 15 MG PO TABS: 15.0000 mg | ORAL_TABLET | Freq: Every day | ORAL | Status: DC

## 2016-01-04 NOTE — Patient Instructions (Addendum)
You have a severe knee contusion. A PCL sprain and meniscus tear are possible but unlikely given the mechanism of injury and your exam. Icing 15 minutes at a time 3-4 times a day. Try meloxicam instead - 15 mg daily with food for pain and inflammation. Check with your psychiatrist about your contrave - you should not take strong pain medicine (norco) with this and I think you will need this pain medicine in the short term. Knee brace when up and walking around. Start physical therapy and do home exercises on days you don't go to therapy. Follow up with me in 1 month. If not improving would go ahead with an MRI.

## 2016-01-05 ENCOUNTER — Telehealth: Payer: Self-pay | Admitting: Family Medicine

## 2016-01-05 DIAGNOSIS — M25562 Pain in left knee: Secondary | ICD-10-CM | POA: Insufficient documentation

## 2016-01-05 MED ORDER — HYDROCODONE-ACETAMINOPHEN 5-325 MG PO TABS: 1.0000 | ORAL_TABLET | Freq: Four times a day (QID) | ORAL | Status: DC | PRN
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
## 2016-01-05 NOTE — Telephone Encounter (Signed)
Ok - ask patient to take only half a tablet as needed first to make sure she tolerates it.  She can pick script up front.

## 2016-01-05 NOTE — Telephone Encounter (Signed)
Spoke to patient and told her that she could come by the office to pick up script. I also told her about taking only half a tablet first to make sure she does okay with the medication.

## 2016-01-05 NOTE — Assessment & Plan Note (Signed)
Independently reviewed radiographs - no evidence fracture or other bony abnormalities.  Exam is reassuring though she is guarding on exam.  2/2 severe knee contusion.  No effusion on ultrasound.  Will try icing, meloxicam.  No narcotic pain medication while on contrave - she will speak to her psychiatrist.  Knee brace for support.  Start physical therapy and home exercises.  F/u in 1 month.  Consider MRI if not improving.

## 2016-01-18 DIAGNOSIS — E119 Type 2 diabetes mellitus without complications: Secondary | ICD-10-CM

## 2016-01-20 ENCOUNTER — Ambulatory Visit: Payer: Medicare Other | Admitting: Physical Therapy

## 2016-01-28 ENCOUNTER — Ambulatory Visit: Payer: Medicare Other | Attending: Family Medicine | Admitting: Physical Therapy

## 2016-01-28 ENCOUNTER — Encounter: Payer: Self-pay | Admitting: Physical Therapy

## 2016-01-28 DIAGNOSIS — M25562 Pain in left knee: Secondary | ICD-10-CM | POA: Insufficient documentation

## 2016-01-28 DIAGNOSIS — R262 Difficulty in walking, not elsewhere classified: Secondary | ICD-10-CM | POA: Insufficient documentation

## 2016-01-28 DIAGNOSIS — M25662 Stiffness of left knee, not elsewhere classified: Secondary | ICD-10-CM | POA: Diagnosis present
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
## 2016-02-02 ENCOUNTER — Ambulatory Visit: Payer: Medicare Other | Admitting: Physical Therapy

## 2016-02-04 ENCOUNTER — Ambulatory Visit: Payer: Medicare Other | Admitting: Family Medicine

## 2016-02-08 ENCOUNTER — Ambulatory Visit (INDEPENDENT_AMBULATORY_CARE_PROVIDER_SITE_OTHER): Payer: Medicare Other | Admitting: Family Medicine

## 2016-02-08 ENCOUNTER — Encounter: Payer: Self-pay | Admitting: Family Medicine

## 2016-02-08 VITALS — BP 127/85 | HR 92 | Ht 63.0 in | Wt 243.0 lb

## 2016-02-08 DIAGNOSIS — M25562 Pain in left knee: Secondary | ICD-10-CM | POA: Diagnosis not present

## 2016-02-08 NOTE — Patient Instructions (Signed)
You have a severe knee contusion. Your exam is reassuring. I would push it more in physical therapy to regain your motion and strength. Icing 15 minutes at a time 3-4 times a day. Meloxicam 15mg  daily as needed - you could try 2 aleve twice a day with food instead since the meloxicam doesn't seem to be helping you that much. Knee brace when up and walking around. Follow up with me in 1 month. If not improving would go ahead with an MRI.
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
## 2016-02-09 NOTE — Assessment & Plan Note (Signed)
Independently reviewed radiographs - no evidence fracture or other bony abnormalities.  Ultrasound and exam also reassuring.  2/2 severe knee contusion.  No effusion.  Only had 1 visit of PT so would like to give this more of a trial before considering MRI.  Continue meloxicam, icing.  Brace for support.  F/u in 1 month.

## 2016-02-11 ENCOUNTER — Ambulatory Visit: Payer: Medicare Other | Admitting: Physical Therapy

## 2016-02-14 ENCOUNTER — Ambulatory Visit: Payer: Medicare Other | Attending: Family Medicine | Admitting: Physical Therapy

## 2016-02-14 DIAGNOSIS — M25662 Stiffness of left knee, not elsewhere classified: Secondary | ICD-10-CM | POA: Insufficient documentation

## 2016-02-14 DIAGNOSIS — R262 Difficulty in walking, not elsewhere classified: Secondary | ICD-10-CM | POA: Insufficient documentation

## 2016-02-14 DIAGNOSIS — M25562 Pain in left knee: Secondary | ICD-10-CM | POA: Insufficient documentation

## 2016-02-15 ENCOUNTER — Ambulatory Visit: Payer: Self-pay | Admitting: Family Medicine

## 2016-02-16 ENCOUNTER — Ambulatory Visit: Payer: Medicare Other | Admitting: Physical Therapy

## 2016-02-16 ENCOUNTER — Encounter: Payer: Self-pay | Admitting: Physical Therapy

## 2016-02-16 DIAGNOSIS — R262 Difficulty in walking, not elsewhere classified: Secondary | ICD-10-CM | POA: Diagnosis present

## 2016-02-16 DIAGNOSIS — M25562 Pain in left knee: Secondary | ICD-10-CM

## 2016-02-16 DIAGNOSIS — M25662 Stiffness of left knee, not elsewhere classified: Secondary | ICD-10-CM
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
## 2016-02-18 ENCOUNTER — Ambulatory Visit: Payer: Medicare Other | Admitting: Physical Therapy

## 2016-02-22 ENCOUNTER — Encounter: Payer: Self-pay | Admitting: Physical Therapy

## 2016-02-22 ENCOUNTER — Ambulatory Visit: Payer: Medicare Other | Admitting: Physical Therapy

## 2016-02-22 DIAGNOSIS — M25562 Pain in left knee: Secondary | ICD-10-CM

## 2016-02-22 DIAGNOSIS — R262 Difficulty in walking, not elsewhere classified: Secondary | ICD-10-CM

## 2016-02-22 DIAGNOSIS — M25662 Stiffness of left knee, not elsewhere classified: Secondary | ICD-10-CM

## 2016-02-22 NOTE — Therapy (Signed)
[place] [address] [place], [place], [address] Phone: [phone]   Fax:  [phone]  Physical Therapy Treatment  Patient Details  Name: Laura Horton MRN: [id number] Date of Birth: [birth year] Referring Provider: Barbaraann Barthel  Encounter Date: 02/22/2016      PT End of Session - 02/22/16 1423    Visit Number 2   Authorization Type [employer] $40 copay   PT Start Time 1350   PT Stop Time 1425   PT Time Calculation (min) 35 min   Activity Tolerance Patient tolerated treatment well   Behavior During Therapy Anxious      Past Medical History  Diagnosis Date  . Anxiety   . Depression   . Bipolar 1 disorder ([place])   . ADHD (attention deficit hyperactivity disorder)   . Chronic back pain   . Diabetes mellitus without complication ([place])     History reviewed. No pertinent past surgical history.  There were no vitals filed for this visit.      Subjective Assessment - 02/22/16 1356    Subjective Pt reports that she has been doing the exercises he give her.   Currently in Pain? Yes   Pain Score 5    Pain Location Knee   Pain Orientation Right                         [facility] Adult PT Treatment/Exercise - 02/22/16 0001    Exercises   Exercises Knee/Hip   Knee/Hip Exercises: Aerobic   Nustep L4 x6 minutes   Knee/Hip Exercises: Machines for Strengthening   Cybex Leg Press 20lb 3x10   Knee/Hip Exercises: Standing   Forward Step Up Left;2 sets;10 reps;Hand Hold: 0;Step Height: 4"   Other Standing Knee Exercises Sit to stand 2x5    Knee/Hip Exercises: Seated   Long Arc Quad 2 sets;Left;10 reps   Long Arc Quad Weight 3 lbs.   Other Seated Knee/Hip Exercises Seated march LLE only 3lb 2x10   Modalities   Modalities --   Cryotherapy   Number Minutes Cryotherapy --   Cryotherapy Location --   Type of Cryotherapy --   Acupuncturist Location --   Tour manager Action --   Electrical Stimulation Parameters --   Printmaker Goals --                  PT Short Term Goals - 01/28/16 1140    PT SHORT TERM GOAL #1   Title independent with initial HEP   Time 1   Period Weeks   Status New           PT Long Term Goals - 01/28/16 1140    PT LONG TERM GOAL #1   Title independent with RICE   Time 8   Period Weeks   Status New   PT LONG TERM GOAL #2   Title decrease pain 25%   Period Weeks   Status New   PT LONG TERM GOAL #3   Title increase left knee AROM to 10-105 degrees flexion   Time 8   Period Weeks   Status New   PT LONG TERM GOAL #4   Title walk 400 feet without pain >5/10   Time 8   Period Weeks   Status New               Plan - 02/22/16 1423    Clinical Impression  Statement Pt five minutes late for today's treatment. Pt reports L knee pain but able to push through and complete all exercises well. Pt does require increase time to complete interventions, pt performs each rep very slowly. Pt does remain anxious and fearful  when moving the knee. Pt denied ice and e-Stim post treatment.   PT Frequency 2x / week   PT Duration 8 weeks   PT Treatment/Interventions ADLs/Self Care Home Management;Cryotherapy;Electrical Stimulation;Iontophoresis 4mg /ml Dexamethasone;Moist Heat;Therapeutic exercise;Therapeutic activities;Functional mobility training;Stair training;Gait training;Ultrasound;Patient/family education;Manual techniques;Taping   PT Next Visit Plan slowly get patient moving      Patient will benefit from skilled therapeutic intervention in order to improve the following deficits and impairments:  Abnormal gait, Decreased activity tolerance, Decreased balance, Decreased mobility, Decreased range of motion, Difficulty walking, Decreased strength, Increased edema, Impaired flexibility, Pain  Visit Diagnosis: Pain in left knee  Stiffness of left knee, not elsewhere  classified  Difficulty in walking, not elsewhere classified     Problem List Patient Active Problem List   Diagnosis Date Noted  . Controlled diabetes mellitus without complication ([place]) [id number]  . Left knee pain 01/05/2016  . Faintness 12/31/2015  . Syncope 12/31/2015  . Essential hypertension 12/31/2015  . Elevated blood pressure 09/07/2015  . Nausea with vomiting 04/21/2015  . Prediabetes 04/21/2015  . Vitamin D deficiency 12/23/2014  . History of vitamin D deficiency 08/24/2014  . Menorrhagia 08/24/2014  . Sinusitis, bacterial 07/02/2014  . Abdominal pain, chronic, right lower quadrant 06/05/2014  . Snoring 05/05/2014  . Back pain 05/05/2014  . Pain in left shin 02/27/2014  . Nausea alone 01/29/2014  . Seasonal allergies 12/19/2013  . Blurry vision 11/04/2013  . ADD (attention deficit disorder) 07/06/2013  . INSOMNIA, CHRONIC 03/10/2010  . 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 Obsessive-compulsive disorder 02/24/2010  . Obesity 09/08/2009  . Bipolar disorder ([place]) 09/08/2009    Scot Jun, PTA  02/22/2016, 2:27 PM  [address] [place], [place], [address] Phone: [phone]   Fax:  [phone]  Name: Laura Horton MRN: [id number] Date of Birth: 1976/08/02

## 2016-02-24 ENCOUNTER — Ambulatory Visit: Payer: Medicare Other | Admitting: Physical Therapy

## 2016-02-24 ENCOUNTER — Encounter: Payer: Medicare Other | Admitting: Physical Therapy

## 2016-02-25 ENCOUNTER — Ambulatory Visit: Payer: Medicare Other | Admitting: Physical Therapy

## 2016-02-29 ENCOUNTER — Ambulatory Visit: Payer: Medicare Other | Admitting: Physical Therapy

## 2016-02-29 ENCOUNTER — Encounter: Payer: Self-pay | Admitting: Physical Therapy

## 2016-02-29 DIAGNOSIS — M25562 Pain in left knee: Secondary | ICD-10-CM

## 2016-02-29 DIAGNOSIS — R262 Difficulty in walking, not elsewhere classified: Secondary | ICD-10-CM

## 2016-02-29 DIAGNOSIS — M25662 Stiffness of left knee, not elsewhere classified: Secondary | ICD-10-CM

## 2016-02-29 NOTE — Therapy (Signed)
[place] [address] [place], [place], [address] Phone: [phone]   Fax:  [phone]  Physical Therapy Treatment  Patient Details  Name: Laura Horton MRN: [id number] Date of Birth: 03-08-1976 Referring Provider: Barbaraann Barthel  Encounter Date: 02/29/2016      PT End of Session - 02/29/16 1512    Visit Number 3   PT Start Time 2355   PT Stop Time 7322   PT Time Calculation (min) 55 min   Activity Tolerance Patient tolerated treatment well   Behavior During Therapy Anxious      Past Medical History  Diagnosis Date  . Anxiety   . Depression   . Bipolar 1 disorder ([place])   . ADHD (attention deficit hyperactivity disorder)   . Chronic back pain   . Diabetes mellitus without complication ([place])     History reviewed. No pertinent past surgical history.  There were no vitals filed for this visit.      Subjective Assessment - 02/29/16 1433    Subjective "Its been going"   Currently in Pain? Yes   Pain Score 4    Pain Location Knee   Pain Orientation Left            [facility] PT Assessment - 02/29/16 0001    AROM   Overall AROM Comments AROM 0-101 degrees flexion with pain and apprehension   Strength   Overall Strength Comments 4/5 with pain                     [facility] Adult PT Treatment/Exercise - 02/29/16 0001    Ambulation/Gait   Gait Comments Gait around building > 500 ft, up and down hill. Pt with a slight antalgic gait and decrease gait speed up hill. Pt does use LLE to sep up curb and uses LLE to control descent off curb   Exercises   Exercises Knee/Hip   Knee/Hip Exercises: Aerobic   Stationary Bike 4 minutes   Nustep L4 x6 minutes   Knee/Hip Exercises: Standing   Other Standing Knee Exercises Sit to stand x5    Knee/Hip Exercises: Seated   Long Arc Quad 2 sets;Left;10 reps   Long Arc Quad Weight 3 lbs.   Hamstring Curl 2 sets;10 reps   Hamstring Limitations green Tband     Modalities   Modalities Vasopneumatic   Vasopneumatic   Number Minutes Vasopneumatic  15 minutes   Vasopnuematic Location  Knee   Vasopneumatic Pressure Medium   Vasopneumatic Temperature  32                  PT Short Term Goals - 01/28/16 1140    PT SHORT TERM GOAL #1   Title independent with initial HEP   Time 1   Period Weeks   Status New           PT Long Term Goals - 02/29/16 1449    PT LONG TERM GOAL #3   Title increase left knee AROM to 10-105 degrees flexion   Status Partially Met   PT LONG TERM GOAL #4   Title walk 400 feet without pain >5/10   Baseline 6/10 pain   Status Partially Met               Plan - 02/29/16 1513    Clinical Impression Statement Pt has progress and increases L knee strength and ROM. Pt able to complete all exercises despite reports of L knee pain.  Pt tolerated outdoor ambulation without LOB. Completes all exercises with a slow motion dur to pain.   Rehab Potential Fair   PT Frequency 2x / week   PT Duration 8 weeks   PT Treatment/Interventions ADLs/Self Care Home Management;Cryotherapy;Electrical Stimulation;Iontophoresis 33m/ml Dexamethasone;Moist Heat;Therapeutic exercise;Therapeutic activities;Functional mobility training;Stair training;Gait training;Ultrasound;Patient/family education;Manual techniques;Taping   PT Next Visit Plan progress as tolerated.      Patient will benefit from skilled therapeutic intervention in order to improve the following deficits and impairments:  Abnormal gait, Decreased activity tolerance, Decreased balance, Decreased mobility, Decreased range of motion, Difficulty walking, Decreased strength, Increased edema, Impaired flexibility, Pain  Visit Diagnosis: Pain in left knee  Stiffness of left knee, not elsewhere classified  Difficulty in walking, not elsewhere classified     Problem List Patient Active Problem List   Diagnosis Date Noted  . Controlled diabetes mellitus  without complication ([place] 076/54/6503 . Left knee pain 01/05/2016  . Faintness 12/31/2015  . Syncope 12/31/2015  . Essential hypertension 12/31/2015  . Elevated blood pressure 09/07/2015  . Nausea with vomiting 04/21/2015  . Prediabetes 04/21/2015  . Vitamin D deficiency 12/23/2014  . History of vitamin D deficiency 08/24/2014  . Menorrhagia 08/24/2014  . Sinusitis, bacterial 07/02/2014  . Abdominal pain, chronic, right lower quadrant 06/05/2014  . Snoring 05/05/2014  . Back pain 05/05/2014  . Pain in left shin 02/27/2014  . Nausea alone 01/29/2014  . Seasonal allergies 12/19/2013  . Blurry vision 11/04/2013  . ADD (attention deficit disorder) 07/06/2013  . INSOMNIA, CHRONIC 03/10/2010  . 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 Obsessive-compulsive disorder 02/24/2010  . Obesity 09/08/2009  . Bipolar disorder ([place] 09/08/2009    RScot Jun PTA 02/29/2016, 3:15 PM  [address] [place] [address]: [phone]  Fax:  [phone] Name: JZOPHIA MARRONEMRN: [id number] of Birth: 1April 03, 1977

## 2016-03-02 ENCOUNTER — Encounter: Payer: Self-pay | Admitting: Physical Therapy

## 2016-03-02 ENCOUNTER — Ambulatory Visit: Payer: Medicare Other | Admitting: Physical Therapy

## 2016-03-02 DIAGNOSIS — M25562 Pain in left knee: Secondary | ICD-10-CM | POA: Diagnosis not present

## 2016-03-02 DIAGNOSIS — M25662 Stiffness of left knee, not elsewhere classified: Secondary | ICD-10-CM

## 2016-03-02 DIAGNOSIS — R262 Difficulty in walking, not elsewhere classified: Secondary | ICD-10-CM

## 2016-03-02 NOTE — Therapy (Addendum)
[place], [place], [address] Phone: [phone]   Fax:  [phone]  Physical Therapy Treatment  Patient Details  Name: Laura Horton MRN: [id number] Date of Birth: 03/31/1976 Referring Provider: Barbaraann Barthel  Encounter Date: 03/02/2016      PT End of Session - 03/02/16 1512    Visit Number 4   Date for PT Re-Evaluation 03/29/16   PT Start Time 1430   PT Stop Time 1530   PT Time Calculation (min) 60 min   Activity Tolerance Patient tolerated treatment well   Behavior During Therapy Anxious      Past Medical History  Diagnosis Date  . Anxiety   . Depression   . Bipolar 1 disorder ([place])   . ADHD (attention deficit hyperactivity disorder)   . Chronic back pain   . Diabetes mellitus without complication ([place])     History reviewed. No pertinent past surgical history.  There were no vitals filed for this visit.      Subjective Assessment - 03/02/16 1433    Subjective "Its been bothering me a little more since the last session"   Currently in Pain? Yes   Pain Score 6    Pain Location Knee   Pain Orientation Left                         [facility] Adult PT Treatment/Exercise - 03/02/16 0001    High Level Balance   High Level Balance Activities Side stepping   Knee/Hip Exercises: Aerobic   Stationary Bike 4 minutes   Nustep L4 x6 minutes   Knee/Hip Exercises: Machines for Strengthening   Cybex Knee Extension 10lb 2x10; LLE only 5lb x15   Cybex Knee Flexion 25lb 2x10; LLE only 10lb x15   Cybex Leg Press 20lb 3x10   Knee/Hip Exercises: Standing   Other Standing Knee Exercises Sit to stand x10 with UE; x5 without UE    Vasopneumatic   Number Minutes Vasopneumatic  15 minutes   Vasopnuematic Location  Knee   Vasopneumatic Pressure Medium   Vasopneumatic Temperature  32                  PT Short Term Goals - 01/28/16 1140    PT SHORT TERM GOAL #1   Title independent with initial HEP   Time 1   Period Weeks   Status New           PT Long Term Goals - 02/29/16 1449    PT LONG TERM GOAL #3   Title increase left knee AROM to 10-105 degrees flexion   Status Partially Met   PT LONG TERM GOAL #4   Title walk 400 feet without pain >5/10   Baseline 6/10 pain   Status Partially Met               Plan - 03/02/16 1512    Clinical Impression Statement Pt reports pain throughout session but able to push through. Progressed to machine level interventions bilaterally and single leg.    Rehab Potential Fair   PT Frequency 2x / week   PT Duration 8 weeks   PT Treatment/Interventions ADLs/Self Care Home Management;Cryotherapy;Electrical Stimulation;Iontophoresis 62m/ml Dexamethasone;Moist Heat;Therapeutic exercise;Therapeutic activities;Functional mobility training;Stair training;Gait training;Ultrasound;Patient/family education;Manual techniques;Taping   PT Next Visit Plan progress as tolerated.      Patient will benefit from skilled therapeutic intervention in order to improve the following deficits and impairments:  Abnormal gait, Decreased activity tolerance, Decreased balance, Decreased mobility, Decreased range of motion, Difficulty walking, Decreased strength, Increased edema, Impaired flexibility, Pain  Visit Diagnosis: Pain in left knee  Stiffness of left knee, not elsewhere classified  Difficulty in walking, not elsewhere classified     Problem List Patient Active Problem List   Diagnosis Date Noted  . Controlled diabetes mellitus without complication ([place]) 49/17/9150  . Left knee pain 01/05/2016  . Faintness 12/31/2015  . Syncope 12/31/2015  . Essential hypertension 12/31/2015  . Elevated blood pressure 09/07/2015  . Nausea with vomiting 04/21/2015  . Prediabetes 04/21/2015  . Vitamin D deficiency 12/23/2014  . History of vitamin D deficiency 08/24/2014  . Menorrhagia 08/24/2014  . Sinusitis, bacterial  07/02/2014  . Abdominal pain, chronic, right lower quadrant 06/05/2014  . Snoring 05/05/2014  . Back pain 05/05/2014  . Pain in left shin 02/27/2014  . Nausea alone 01/29/2014  . Seasonal allergies 12/19/2013  . Blurry vision 11/04/2013  . ADD (attention deficit disorder) 07/06/2013  . INSOMNIA, CHRONIC 03/10/2010  . 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 Obsessive-compulsive disorder 02/24/2010  . Obesity 09/08/2009  . Bipolar disorder ([place]) 09/08/2009    PHYSICAL THERAPY DISCHARGE SUMMARY  Visits from Start of Care: 4  Plan: Patient agrees to discharge.  Patient goals were partially met. Patient is being discharged due to being pleased with the current functional level.  ?????       Scot Jun, PTA  03/02/2016, 3:14 PM  [place] [address] [place], [place], [address] Phone: [phone]   Fax:  [phone]  Name: Laura Horton MRN: [id number] Date of Birth: [birth year]

## 2016-03-08 ENCOUNTER — Ambulatory Visit: Payer: Medicare Other | Attending: Family Medicine | Admitting: Physical Therapy

## 2016-03-08 ENCOUNTER — Other Ambulatory Visit: Payer: Self-pay | Admitting: Family Medicine

## 2016-03-14 ENCOUNTER — Ambulatory Visit: Payer: Medicare Other | Admitting: Family Medicine

## 2016-03-14 ENCOUNTER — Encounter: Payer: Self-pay | Admitting: Family Medicine

## 2016-03-14 ENCOUNTER — Ambulatory Visit (INDEPENDENT_AMBULATORY_CARE_PROVIDER_SITE_OTHER): Payer: Medicare Other | Admitting: Family Medicine

## 2016-03-14 VITALS — BP 126/85 | HR 98 | Ht 63.0 in | Wt 233.0 lb

## 2016-03-14 DIAGNOSIS — M25562 Pain in left knee: Secondary | ICD-10-CM | POA: Diagnosis not present

## 2016-03-14 NOTE — Patient Instructions (Signed)
Go for a couple more visits of physical therapy then transition to the home exercise program. Do home exercises most days of the week for 6 more weeks. Follow up with me in 6-8 weeks if you're still having problems. Ice, aleve only if needed.

## 2016-03-17 NOTE — Assessment & Plan Note (Signed)
Radiographs were negative.  Ultrasound, exam reassuring.  Consistent with severe knee contusion.  Improving with home exercises, physical therapy.  Icing, aleve only if needed.  Transition to home exercise program.  F/u in 6-8 weeks if still having problems.
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
## 2016-03-31 ENCOUNTER — Other Ambulatory Visit: Payer: Self-pay | Admitting: *Deleted

## 2016-03-31 ENCOUNTER — Other Ambulatory Visit: Payer: Self-pay | Admitting: Family Medicine

## 2016-03-31 MED ORDER — MELOXICAM 15 MG PO TABS: 15.0000 mg | ORAL_TABLET | Freq: Every day | ORAL | 1 refills | Status: DC

## 2016-04-16 ENCOUNTER — Emergency Department (HOSPITAL_COMMUNITY)
Admission: EM | Admit: 2016-04-16 | Discharge: 2016-04-16 | Disposition: A | Discharge status: Home / Self Care | Payer: Medicare Other | Attending: Emergency Medicine | Admitting: Emergency Medicine

## 2016-04-16 ENCOUNTER — Emergency Department (HOSPITAL_COMMUNITY): Payer: Medicare Other

## 2016-04-16 ENCOUNTER — Encounter (HOSPITAL_COMMUNITY): Payer: Self-pay | Admitting: Emergency Medicine

## 2016-04-16 DIAGNOSIS — F909 Attention-deficit hyperactivity disorder, unspecified type: Secondary | ICD-10-CM | POA: Diagnosis not present

## 2016-04-16 DIAGNOSIS — Y999 Unspecified external cause status: Secondary | ICD-10-CM | POA: Diagnosis not present

## 2016-04-16 DIAGNOSIS — Y9389 Activity, other specified: Secondary | ICD-10-CM | POA: Insufficient documentation

## 2016-04-16 DIAGNOSIS — S43022A Posterior subluxation of left humerus, initial encounter: Secondary | ICD-10-CM

## 2016-04-16 DIAGNOSIS — Y92009 Unspecified place in unspecified non-institutional (private) residence as the place of occurrence of the external cause: Secondary | ICD-10-CM | POA: Diagnosis not present

## 2016-04-16 DIAGNOSIS — Z79899 Other long term (current) drug therapy: Secondary | ICD-10-CM | POA: Diagnosis not present

## 2016-04-16 DIAGNOSIS — E119 Type 2 diabetes mellitus without complications: Secondary | ICD-10-CM | POA: Diagnosis not present

## 2016-04-16 DIAGNOSIS — Z792 Long term (current) use of antibiotics: Secondary | ICD-10-CM | POA: Insufficient documentation

## 2016-04-16 DIAGNOSIS — S4992XA Unspecified injury of left shoulder and upper arm, initial encounter: Secondary | ICD-10-CM | POA: Diagnosis present

## 2016-04-16 DIAGNOSIS — Z7984 Long term (current) use of oral hypoglycemic drugs: Secondary | ICD-10-CM | POA: Diagnosis not present

## 2016-04-16 DIAGNOSIS — S43025A Posterior dislocation of left humerus, initial encounter: Secondary | ICD-10-CM | POA: Insufficient documentation

## 2016-04-16 DIAGNOSIS — W1839XA Other fall on same level, initial encounter: Secondary | ICD-10-CM | POA: Diagnosis not present

## 2016-04-16 DIAGNOSIS — I10 Essential (primary) hypertension: Secondary | ICD-10-CM | POA: Diagnosis not present

## 2016-04-16 MED ORDER — FENTANYL CITRATE (PF) 100 MCG/2ML IJ SOLN
100.0000 ug | Freq: Once | INTRAMUSCULAR | Status: AC
  Administered 2016-04-16: 100 ug via INTRAVENOUS
  Filled 2016-04-16: qty 2

## 2016-04-16 MED ORDER — ONDANSETRON HCL 4 MG/2ML IJ SOLN
4.0000 mg | Freq: Once | INTRAMUSCULAR | Status: AC
  Administered 2016-04-16: 4 mg via INTRAVENOUS
  Filled 2016-04-16: qty 2

## 2016-04-16 MED ORDER — OXYCODONE-ACETAMINOPHEN 5-325 MG PO TABS: 1.0000 | ORAL_TABLET | Freq: Three times a day (TID) | ORAL | 0 refills | Status: DC | PRN

## 2016-04-16 MED ORDER — SODIUM CHLORIDE 0.9 % IV BOLUS (SEPSIS)
1000.0000 mL | Freq: Once | INTRAVENOUS | Status: AC
  Administered 2016-04-16: 1000 mL via INTRAVENOUS

## 2016-04-16 MED ORDER — MORPHINE SULFATE (PF) 4 MG/ML IV SOLN
4.0000 mg | Freq: Once | INTRAVENOUS | Status: AC
  Administered 2016-04-16: 4 mg via INTRAVENOUS
  Filled 2016-04-16: qty 1

## 2016-04-16 MED ORDER — HYDROMORPHONE HCL 1 MG/ML IJ SOLN
1.0000 mg | Freq: Once | INTRAMUSCULAR | Status: AC
Start: 2016-04-16 — End: 2016-04-16
  Administered 2016-04-16: 1 mg via INTRAVENOUS
  Filled 2016-04-16: qty 1

## 2016-04-16 MED ORDER — HYDROMORPHONE HCL 1 MG/ML IJ SOLN
1.0000 mg | Freq: Once | INTRAMUSCULAR | Status: AC
  Administered 2016-04-16: 1 mg via INTRAVENOUS
  Filled 2016-04-16: qty 1

## 2016-04-16 MED ORDER — PROPOFOL 10 MG/ML IV BOLUS
1.0000 mg/kg | Freq: Once | INTRAVENOUS | Status: AC
  Administered 2016-04-16: 160 mg via INTRAVENOUS
  Filled 2016-04-16 (×2): qty 20

## 2016-04-16 NOTE — ED Notes (Signed)
Bed: WA01 Expected date:  Expected time:  Means of arrival:  Comments: Hold for FT conscious sedation pt.

## 2016-04-16 NOTE — ED Provider Notes (Signed)
[facility] DEPT Provider Note   CSN: [id number] Arrival date & time: 04/16/16  1622  First Provider Contact:   First MD Initiated Contact with Patient 04/16/16 1744       By signing my name below, I, Rayna Sexton, attest that this documentation has been prepared under the direction and in the presence of Shary Decamp, PA-C. Electronically Signed: Rayna Sexton, ED Scribe. 04/16/16. 6:05 PM.   History   Chief Complaint Chief Complaint  Patient presents with  . Arm Pain    HPI HPI Comments: Laura Horton is a 40 y.o. female who presents to the Emergency Department by ambulance and given 200 mcg fentanyl en-route complaining of a fall that occurred 2 hours ago. Pt was stretched out face down on a medicine ball and fell onto a door landing on her LUE which caused a "pop" and a sudden onset of her pain. She reports associated, moderate, pain to her left shoulder, arm and wrist as well as a mild abrasion to her left forehead. Her pain worsens with any movement or palpation. She denies numbness or other associated symptoms at this time.   The history is provided by the patient. No language interpreter was used.    Past Medical History:  Diagnosis Date  . ADHD (attention deficit hyperactivity disorder)   . Anxiety   . Bipolar 1 disorder ([place])   . Chronic back pain   . Depression   . Diabetes mellitus without complication [facility])     Patient Active Problem List   Diagnosis Date Noted  . Controlled diabetes mellitus without complication ([place]) [id number]  . Left knee pain 01/05/2016  . Faintness 12/31/2015  . Syncope 12/31/2015  . Essential hypertension 12/31/2015  . Elevated blood pressure 09/07/2015  . Nausea with vomiting 04/21/2015  . Prediabetes 04/21/2015  . Vitamin D deficiency 12/23/2014  . History of vitamin D deficiency 08/24/2014  . Menorrhagia 08/24/2014  . Sinusitis, bacterial 07/02/2014  . Abdominal pain, chronic, right lower quadrant 06/05/2014  .  Snoring 05/05/2014  . Back pain 05/05/2014  . Pain in left shin 02/27/2014  . Nausea alone 01/29/2014  . Seasonal allergies 12/19/2013  . Blurry vision 11/04/2013  . ADD (attention deficit disorder) 07/06/2013  . INSOMNIA, CHRONIC 03/10/2010  . 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 Obsessive-compulsive disorder 02/24/2010  . Obesity 09/08/2009  . Bipolar disorder ([place]) 09/08/2009    History reviewed. No pertinent surgical history.  OB History    No data available       Home Medications    Prior to Admission medications   Medication Sig Start Date End Date Taking? Authorizing Provider  albuterol (PROVENTIL HFA;VENTOLIN HFA) 108 (90 BASE) MCG/ACT inhaler Inhale 2 puffs into the lungs every 6 (six) hours as needed for wheezing or shortness of breath. Patient not taking: Reported on 12/31/2015 09/23/14   Frazier Richards, MD  amphetamine-dextroamphetamine (ADDERALL) 30 MG tablet Take 30 mg by mouth 2 (two) times daily.    Historical Provider, MD  cetirizine (ZYRTEC) 10 MG tablet Take 1 tablet (10 mg total) by mouth daily. Patient not taking: Reported on 12/31/2015 01/29/14   Melony Overly, MD  Cholecalciferol 1000 UNITS tablet Take 1 tablet (1,000 Units total) by mouth daily. Patient not taking: Reported on 12/31/2015 02/16/15   Frazier Richards, MD  clonazePAM [facility]) 2 MG tablet  01/26/16   Historical Provider, MD  cloNIDine (CATAPRES) 0.2 MG tablet Take 1 tablet (0.2 mg total) by mouth 4 (four) times daily.  Patient taking differently: Take 0.2 mg by mouth 3 (three) times daily.  09/07/15   Frazier Richards, MD  dicyclomine (BENTYL) 20 MG tablet Take 1 tablet (20 mg total) by mouth 4 (four) times daily -  before meals and at bedtime. Patient not taking: Reported on 12/31/2015 05/19/15   Frazier Richards, MD  escitalopram (LEXAPRO) 20 MG tablet Take 1 tablet (20 mg total) by mouth daily. Patient not taking: Reported on 12/31/2015 09/07/15   Frazier Richards, MD  flurazepam [facility]) 30 MG capsule  Take 30 mg by mouth at bedtime as needed for sleep.    Historical Provider, MD  HYDROcodone-acetaminophen (NORCO) 5-325 MG tablet Take 1 tablet by mouth every 6 (six) hours as needed for moderate pain. 01/05/16   Dene Gentry, MD  hydrOXYzine (VISTARIL) 50 MG capsule Take 1 capsule (50 mg total) by mouth every 8 (eight) hours as needed. 04/21/15   Frazier Richards, MD  Levonorgestrel-Ethinyl Estradiol (AMETHIA) 0.15-0.03 &0.01 MG tablet Take 1 tablet by mouth daily. Patient not taking: Reported on 12/31/2015 08/24/14   Frazier Richards, MD  meloxicam (MOBIC) 15 MG tablet Take 1 tablet (15 mg total) by mouth daily. 03/31/16   Dene Gentry, MD  metFORMIN (GLUMETZA) 500 MG (MOD) 24 hr tablet Take 500 mg by mouth daily with breakfast.    Historical Provider, MD  methocarbamol (ROBAXIN) 500 MG tablet Take 1 tablet (500 mg total) by mouth every 8 (eight) hours as needed for muscle spasms. 12/30/15   Fransico Meadow, PA-C  mometasone (NASONEX) 50 MCG/ACT nasal spray Place 2 sprays into the nose daily. 02/27/14   Melony Overly, MD  montelukast (SINGULAIR) 10 MG tablet Take 1 tablet (10 mg total) by mouth at bedtime. Patient not taking: Reported on 12/31/2015 12/24/14   Frazier Richards, MD  Multiple Vitamins-Minerals (MULTIVITAMIN) tablet Take 1 tablet by mouth daily. 06/06/14   Olam Idler, MD  Naltrexone-Bupropion HCl (CONTRAVE PO) Take 1 tablet by mouth daily.    Historical Provider, MD  omeprazole (PRILOSEC) 20 MG capsule Take 1 capsule (20 mg total) by mouth daily as needed (for reflux). For 2 weeks, then as needed. Patient not taking: Reported on 12/31/2015 07/05/15   Frazier Richards, MD  ondansetron (ZOFRAN) 4 MG tablet Take 1 tablet (4 mg total) by mouth every 8 (eight) hours as needed for nausea or vomiting. Patient not taking: Reported on 12/31/2015 05/12/15   Frazier Richards, MD  polyethylene glycol powder (GLYCOLAX/MIRALAX) powder Take 17 g by mouth 2 (two) times daily as needed for moderate constipation. Take 1  capful in 8 ounces of water every 30 minutes for 8 doses 04/21/15   Frazier Richards, MD  promethazine (PHENERGAN) 25 MG tablet Take 25 mg by mouth every 6 (six) hours as needed for nausea or vomiting.    Historical Provider, MD  ramelteon (ROZEREM) 8 MG tablet Take 1 tablet (8 mg total) by mouth at bedtime. Patient not taking: Reported on 12/31/2015 12/23/14   Frazier Richards, MD    Family History Family History  Problem Relation Age of Onset  . Heart disease Mother   . Hypertension Mother   . Hyperlipidemia Mother   . Cancer Father     LUNG  . Mental illness Maternal Grandmother   . Cancer Maternal Grandmother     breast  . Depression Maternal Grandmother   . Bipolar disorder Maternal Aunt   . ADD / ADHD Cousin  Social History Social History  Substance Use Topics  . Smoking status: Never Smoker  . Smokeless tobacco: Never Used  . Alcohol use No     Allergies   Quetiapine and Ziprasidone mesylate   Review of Systems Review of Systems  Musculoskeletal: Positive for arthralgias and myalgias.  Skin: Positive for wound.  Neurological: Negative for numbness.   Physical Exam Updated Vital Signs BP 141/92 (BP Location: Right Arm)   Pulse 82   Temp 97.6 F (36.4 C) (Oral)   Resp 20   SpO2 97%   Physical Exam  Constitutional: She is oriented to person, place, and time. She appears well-developed and well-nourished.  HENT:  Head: Normocephalic and atraumatic.  Eyes: EOM are normal.  Neck: Normal range of motion.  Cardiovascular: Normal rate and intact distal pulses.   2+ radial pulses bilaterally  Pulmonary/Chest: Effort normal. No respiratory distress.  Abdominal: Soft.  Musculoskeletal: Normal range of motion.  Exam limited due to pain. No obvious deformities or visible skin changes noted on exam.   Neurological: She is alert and oriented to person, place, and time. No sensory deficit.  Neurovascularly intact.   Skin: Skin is warm and dry. Capillary refill takes  less than 2 seconds.  Mild abrasion noted to the left forehead  Psychiatric: She has a normal mood and affect.  Nursing note and vitals reviewed.  ED Treatments / Results  Labs (all labs ordered are listed, but only abnormal results are displayed) Labs Reviewed - No data to display  EKG  EKG Interpretation None       Radiology Dg Elbow Complete Left  Result Date: 04/16/2016 CLINICAL DATA:  Fall from exercise fall onto left arm with pain, initial encounter EXAM: LEFT ELBOW - COMPLETE 3+ VIEW COMPARISON:  None. FINDINGS: There is no evidence of fracture, dislocation, or joint effusion. There is no evidence of arthropathy or other focal bone abnormality. Soft tissues are unremarkable. IMPRESSION: No acute abnormality noted. Electronically Signed   By: Inez Catalina M.D.   On: 04/16/2016 18:03   Dg Forearm Left  Result Date: 04/16/2016 CLINICAL DATA:  Fall from exercise ball onto left arm with forearm pain, initial encounter EXAM: LEFT FOREARM - 2 VIEW COMPARISON:  None. FINDINGS: There is no evidence of fracture or other focal bone lesions. Soft tissues are unremarkable. IMPRESSION: No acute abnormality noted. Electronically Signed   By: Inez Catalina M.D.   On: 04/16/2016 18:02   Dg Wrist Complete Left  Result Date: 04/16/2016 CLINICAL DATA:  Fall from exercise ball on to wrist with pain, initial encounter EXAM: LEFT WRIST - COMPLETE 3+ VIEW COMPARISON:  None. FINDINGS: There is no evidence of fracture or dislocation. There is no evidence of arthropathy or other focal bone abnormality. Soft tissues are unremarkable. IMPRESSION: No acute abnormality noted. Electronically Signed   By: Inez Catalina M.D.   On: 04/16/2016 18:01   Dg Shoulder Left  Result Date: 04/16/2016 CLINICAL DATA:  Recent fall from exercise ball with left shoulder pain, initial encounter EXAM: LEFT SHOULDER - 2+ VIEW COMPARISON:  None. FINDINGS: There is evidence of posterior dislocation of the humeral head with respect  to the glenoid. No acute fracture is seen. The underlying bony thorax is within normal limits. IMPRESSION: Posterior shoulder dislocation Electronically Signed   By: Inez Catalina M.D.   On: 04/16/2016 18:05   Dg Humerus Left  Result Date: 04/16/2016 CLINICAL DATA:  Fall from exercise ball onto left arm with pain, initial encounter EXAM: LEFT  HUMERUS - 2+ VIEW COMPARISON:  None. FINDINGS: No acute fracture is identified. The humeral head is posteriorly dislocated from the glenoid. No soft tissue abnormality is noted. IMPRESSION: Posterior humeral dislocation Electronically Signed   By: Inez Catalina M.D.   On: 04/16/2016 18:04   Dg Hand Complete Left  Result Date: 04/16/2016 CLINICAL DATA:  Fall from exercise ball onto left arm with pain, initial encounter EXAM: LEFT HAND - COMPLETE 3+ VIEW COMPARISON:  None. FINDINGS: There is no evidence of fracture or dislocation. There is no evidence of arthropathy or other focal bone abnormality. Soft tissues are unremarkable. IMPRESSION: No acute abnormality noted. Electronically Signed   By: Inez Catalina M.D.   On: 04/16/2016 18:01    Procedures Reduction of dislocation Date/Time: 04/16/2016 10:14 PM Performed by: Shary Decamp Authorized by: Shary Decamp  Consent: Verbal consent obtained. Written consent obtained. Risks and benefits: risks, benefits and alternatives were discussed Consent given by: patient Patient understanding: patient states understanding of the procedure being performed Patient consent: the patient's understanding of the procedure matches consent given Patient identity confirmed: verbally with patient and arm band Time out: Immediately prior to procedure a "time out" was called to verify the correct patient, procedure, equipment, support staff and site/side marked as required.  Sedation: Patient sedated: yes Sedatives: propofol Analgesia: fentanyl Vitals: Vital signs were monitored during sedation. Patient tolerance: Patient  tolerated the procedure well with no immediate complications Comments: Successful Reduction. No complications    DIAGNOSTIC STUDIES: Oxygen Saturation is 97% on RA, normal by my interpretation.    COORDINATION OF CARE: 5:49 PM Discussed next steps with pt. Pt verbalized understanding and is agreeable with the plan.    Medications Ordered in ED Medications  HYDROmorphone (DILAUDID) injection 1 mg (1 mg Intravenous Given 04/16/16 1659)  ondansetron (ZOFRAN) injection 4 mg (4 mg Intravenous Given 04/16/16 1658)  HYDROmorphone (DILAUDID) injection 1 mg (1 mg Intravenous Given 04/16/16 1806)  propofol (DIPRIVAN) 10 mg/mL bolus/IV push 105.7 mg (160 mg Intravenous Given 04/16/16 1925)  sodium chloride 0.9 % bolus 1,000 mL (0 mLs Intravenous Stopped 04/16/16 2048)  fentaNYL (SUBLIMAZE) injection 100 mcg (100 mcg Intravenous Given 04/16/16 1922)  morphine 4 MG/ML injection 4 mg (4 mg Intravenous Given 04/16/16 2021)   Initial Impression / Assessment and Plan / ED Course  I have reviewed the triage vital signs and the nursing notes.  Pertinent labs & imaging results that were available during my care of the patient were reviewed by me and considered in my medical decision making (see chart for details).  Clinical Course    Final Clinical Impressions(s) / ED Diagnoses  I have reviewed and evaluated the relevant imaging studies.  I have reviewed the relevant previous healthcare records. I obtained HPI from historian. Patient discussed with supervising physician  ED Course:  Assessment: Pt is a 89yF who presents with left arm pain s/p fall from medicine ball. On exam, pt in acute distress and clutching arm. Nontoxic/nonseptic appearing. VSS. Afebrile. Lungs CTA. Limited ROM due to pain. Neurovascularly intact. Imaging of entire arm due to pain out of proportion on exam shows posterior shoulder dislocation with acute fractures. Given analgesia in ED. Consult with Ortho (Dr. Veverly Fells) will reduce in ED.  Successful reduction in ED. Neurovascularly intact on recheck post reduction. Will have follow up with Dr. Veverly Fells outpatient. Sling placed. Plan is to DC home with follow up to Ortho. At time of discharge, Patient is in no acute distress. Vital Signs are stable. Patient  is able to ambulate. Patient able to tolerate PO.    Disposition/Plan:  DC Home Additional Verbal discharge instructions given and discussed with patient.  Pt Instructed to f/u with Ortho in the next week for evaluation and treatment of symptoms. Return precautions given Pt acknowledges and agrees with plan  Supervising Physician Duffy Bruce, MD  Final diagnoses:  Posterior dislocation of left shoulder joint, initial encounter   I personally performed the services described in this documentation, which was scribed in my presence. The recorded information has been reviewed and is accurate.   New Prescriptions New Prescriptions   No medications on file     Shary Decamp, PA-C 04/16/16 2215    Duffy Bruce, MD 04/17/16 1052

## 2016-04-16 NOTE — Consult Note (Signed)
Reason for Consult:Left posterior shoulder dislocation Referring Physician: EDP  Laura Horton is an 40 y.o. female.  HPI: 40 yo female who was working out at home who dislocated her left shoulder when she fell off of an exercise ball earlier this afternoon. She hit her forehead while falling but denies LOC.  Past Medical History:  Diagnosis Date  . ADHD (attention deficit hyperactivity disorder)   . Anxiety   . Bipolar 1 disorder ([place])   . Chronic back pain   . Depression   . Diabetes mellitus without complication ([place])     History reviewed. No pertinent surgical history.  Family History  Problem Relation Age of Onset  . Heart disease Mother   . Hypertension Mother   . Hyperlipidemia Mother   . Cancer Father     LUNG  . Mental illness Maternal Grandmother   . Cancer Maternal Grandmother     breast  . Depression Maternal Grandmother   . Bipolar disorder Maternal Aunt   . ADD / ADHD Cousin     Social History:  reports that she has never smoked. She has never used smokeless tobacco. She reports that she does not drink alcohol or use drugs.  Allergies:  Allergies  Allergen Reactions  . Quetiapine     REACTION: Weight Gain  . Ziprasidone Mesylate     REACTION: Dyspnea, swollen tongue, neck pain, torticullis    Medications: I have reviewed the patient's current medications.  No results found for this or any previous visit (from the past 48 hour(s)).  Dg Elbow Complete Left  Result Date: 04/16/2016 CLINICAL DATA:  Fall from exercise fall onto left arm with pain, initial encounter EXAM: LEFT ELBOW - COMPLETE 3+ VIEW COMPARISON:  None. FINDINGS: There is no evidence of fracture, dislocation, or joint effusion. There is no evidence of arthropathy or other focal bone abnormality. Soft tissues are unremarkable. IMPRESSION: No acute abnormality noted. Electronically Signed   By: Inez Catalina M.D.   On: 04/16/2016 18:03   Dg Forearm Left  Result Date:  04/16/2016 CLINICAL DATA:  Fall from exercise ball onto left arm with forearm pain, initial encounter EXAM: LEFT FOREARM - 2 VIEW COMPARISON:  None. FINDINGS: There is no evidence of fracture or other focal bone lesions. Soft tissues are unremarkable. IMPRESSION: No acute abnormality noted. Electronically Signed   By: Inez Catalina M.D.   On: 04/16/2016 18:02   Dg Wrist Complete Left  Result Date: 04/16/2016 CLINICAL DATA:  Fall from exercise ball on to wrist with pain, initial encounter EXAM: LEFT WRIST - COMPLETE 3+ VIEW COMPARISON:  None. FINDINGS: There is no evidence of fracture or dislocation. There is no evidence of arthropathy or other focal bone abnormality. Soft tissues are unremarkable. IMPRESSION: No acute abnormality noted. Electronically Signed   By: Inez Catalina M.D.   On: 04/16/2016 18:01   Dg Shoulder Left  Result Date: 04/16/2016 CLINICAL DATA:  Recent fall from exercise ball with left shoulder pain, initial encounter EXAM: LEFT SHOULDER - 2+ VIEW COMPARISON:  None. FINDINGS: There is evidence of posterior dislocation of the humeral head with respect to the glenoid. No acute fracture is seen. The underlying bony thorax is within normal limits. IMPRESSION: Posterior shoulder dislocation Electronically Signed   By: Inez Catalina M.D.   On: 04/16/2016 18:05   Dg Shoulder Left Portable  Result Date: 04/16/2016 CLINICAL DATA:  Status post reduction EXAM: LEFT SHOULDER - 2 VIEW COMPARISON:  Film from earlier in the same day FINDINGS:  The previously seen posterior dislocation has been reduced. IMPRESSION: Status post reduction.  No fracture is seen Electronically Signed   By: Inez Catalina M.D.   On: 04/16/2016 19:44   Dg Humerus Left  Result Date: 04/16/2016 CLINICAL DATA:  Fall from exercise ball onto left arm with pain, initial encounter EXAM: LEFT HUMERUS - 2+ VIEW COMPARISON:  None. FINDINGS: No acute fracture is identified. The humeral head is posteriorly dislocated from the glenoid. No  soft tissue abnormality is noted. IMPRESSION: Posterior humeral dislocation Electronically Signed   By: Inez Catalina M.D.   On: 04/16/2016 18:04   Dg Hand Complete Left  Result Date: 04/16/2016 CLINICAL DATA:  Fall from exercise ball onto left arm with pain, initial encounter EXAM: LEFT HAND - COMPLETE 3+ VIEW COMPARISON:  None. FINDINGS: There is no evidence of fracture or dislocation. There is no evidence of arthropathy or other focal bone abnormality. Soft tissues are unremarkable. IMPRESSION: No acute abnormality noted. Electronically Signed   By: Inez Catalina M.D.   On: 04/16/2016 18:01    ROS Blood pressure 135/83, pulse 74, temperature 97.6 F (36.4 C), temperature source Oral, resp. rate 16, weight 105.7 kg (233 lb), last menstrual period 04/13/2016, SpO2 100 %. Physical Exam:  Patient is in extreme distress with the left arm held in internal rotation in her lap.  She is able to wiggle fingers and extend her thumb and has intact sensation in the deltoid nerve distribution. Right UE and bilateral LEs have normal active ROM  Assessment/Plan: Left posterior shoulder dislocation Plan closed reduction in the ED under conscious sedation.  Consent obtained and manipulation performed by EDP with ortho assisting resulting in a visible and audible clunk.  Shoulder clinically reduced after the procedure.  XRAY obtained AP/scapula Y showing a concentric reduction. Plan shoulder abduction pillow sling to keep her out of internal rotation.  Analgesia.  Will need ortho follow up in two weeks.  Laura Horton,STEVEN R 04/16/2016, 7:55 PM

## 2016-04-16 NOTE — ED Notes (Signed)
Wasted remaining propofol with charge nurse Tiffany, RN.

## 2016-04-16 NOTE — ED Notes (Signed)
Pt verbalized understanding of discharge instructions.  Pt instructed not to drive while under the influence of pain medications.  Pt wheeled to family's vehicle where she was ambulatory and independent to car.

## 2016-04-16 NOTE — ED Triage Notes (Signed)
Patient presents from home via EMS for arm pain. Reports falling off medicine ball. No LOC, abrasion above left eyebrow. C/o left arm pain.   18g Right AC, 219mcg fentanyl in route.   Last VS: 140/110, 74hr, 99%ra, 20resp.

## 2016-04-16 NOTE — Discharge Instructions (Signed)
Please read and follow all provided instructions.  Your diagnoses today include:  1. Posterior dislocation of left shoulder joint, initial encounter    Tests performed today include: Vital signs. See below for your results today.   Medications prescribed:  Take as prescribed   Home care instructions:  Follow any educational materials contained in this packet.  Follow-up instructions: Please follow-up with Orthopedics (Dr. Veverly Fells) for further evaluation of symptoms and treatment   Return instructions:  Please return to the Emergency Department if you do not get better, if you get worse, or new symptoms OR  - Fever (temperature greater than 101.43F)  - Bleeding that does not stop with holding pressure to the area    -Severe pain (please note that you may be more sore the day after your accident)  - Chest Pain  - Difficulty breathing  - Severe nausea or vomiting  - Inability to tolerate food and liquids  - Passing out  - Skin becoming red around your wounds  - Change in mental status (confusion or lethargy)  - New numbness or weakness    Please return if you have any other emergent concerns.  Additional Information:  Your vital signs today were: BP 125/85    Pulse 77    Temp 97.6 F (36.4 C) (Oral)    Resp 19    Wt 105.7 kg    LMP 04/13/2016    SpO2 98%    BMI 41.27 kg/m  If your blood pressure (BP) was elevated above 135/85 this visit, please have this repeated by your doctor within one month. ---------------

## 2016-04-17 NOTE — ED Provider Notes (Signed)
.  Sedation Date/Time: 04/17/2016 10:53 AM Performed by: Duffy Bruce Authorized by: Duffy Bruce   Consent:    Consent obtained:  Written   Consent given by:  Patient   Risks discussed:  Allergic reaction, dysrhythmia, inadequate sedation, nausea, respiratory compromise necessitating ventilatory assistance and intubation, prolonged sedation necessitating reversal, prolonged hypoxia resulting in organ damage and vomiting   Alternatives discussed:  Analgesia without sedation Indications:    Procedure performed:  Dislocation reduction   Procedure necessitating sedation performed by:  Physician performing sedation   Intended level of sedation:  Moderate (conscious sedation) Pre-sedation assessment:    Time since last food or drink:  8 hr   ASA classification: class 2 - patient with mild systemic disease     Neck mobility: normal     Mouth opening:  3 or more finger widths   Thyromental distance:  3 finger widths   Mallampati score:  II - soft palate, uvula, fauces visible   Pre-sedation assessments completed and reviewed: airway patency, cardiovascular function, hydration status, mental status, nausea/vomiting, pain level, respiratory function and temperature     History of difficult intubation: no   Immediate pre-procedure details:    Reassessment: Patient reassessed immediately prior to procedure     Reviewed: vital signs, relevant labs/tests and NPO status     Verified: bag valve mask available, emergency equipment available, intubation equipment available, IV patency confirmed, oxygen available and suction available   Procedure details (see MAR for exact dosages):    Preoxygenation:  Nasal cannula   Sedation:  Propofol   Analgesia:  Fentanyl   Intra-procedure monitoring:  Blood pressure monitoring, cardiac monitor, continuous capnometry, continuous pulse oximetry, frequent LOC assessments and frequent vital sign checks   Intra-procedure events: none     Total sedation time  (minutes):  20 Post-procedure details:    Attendance: Constant attendance by certified staff until patient recovered     Recovery: Patient returned to pre-procedure baseline     Estimated blood loss (see I/O flowsheets): no     Post-sedation assessments completed and reviewed: airway patency, cardiovascular function, hydration status, mental status, nausea/vomiting and respiratory function     Patient is stable for discharge or admission: yes     Patient tolerance:  Tolerated well, no immediate complications      Duffy Bruce, MD 04/17/16 1055

## 2016-04-25 ENCOUNTER — Ambulatory Visit: Payer: Medicare Other | Admitting: Family Medicine

## 2016-04-27 ENCOUNTER — Ambulatory Visit: Payer: Medicare Other | Admitting: Family Medicine

## 2016-08-04 ENCOUNTER — Encounter (HOSPITAL_COMMUNITY): Payer: Self-pay | Admitting: Emergency Medicine

## 2016-08-04 ENCOUNTER — Emergency Department (HOSPITAL_COMMUNITY)
Admission: EM | Admit: 2016-08-04 | Discharge: 2016-08-04 | Disposition: A | Discharge status: Home / Self Care | Payer: Medicare Other | Attending: Emergency Medicine | Admitting: Emergency Medicine

## 2016-08-04 ENCOUNTER — Emergency Department (HOSPITAL_COMMUNITY): Payer: Medicare Other

## 2016-08-04 DIAGNOSIS — E119 Type 2 diabetes mellitus without complications: Secondary | ICD-10-CM | POA: Diagnosis not present

## 2016-08-04 DIAGNOSIS — I10 Essential (primary) hypertension: Secondary | ICD-10-CM | POA: Diagnosis not present

## 2016-08-04 DIAGNOSIS — R079 Chest pain, unspecified: Secondary | ICD-10-CM | POA: Insufficient documentation

## 2016-08-04 DIAGNOSIS — R1013 Epigastric pain: Secondary | ICD-10-CM | POA: Insufficient documentation

## 2016-08-04 DIAGNOSIS — F909 Attention-deficit hyperactivity disorder, unspecified type: Secondary | ICD-10-CM | POA: Diagnosis not present

## 2016-08-04 LAB — CBC WITH DIFFERENTIAL/PLATELET
BASOS ABS: 0.1 10*3/uL (ref 0.0–0.1)
BASOS PCT: 1 %
EOS PCT: 1 %
Eosinophils Absolute: 0 10*3/uL (ref 0.0–0.7)
HEMATOCRIT: 35.3 % — AB (ref 36.0–46.0)
Hemoglobin: 11.8 g/dL — ABNORMAL LOW (ref 12.0–15.0)
Lymphocytes Relative: 55 %
Lymphs Abs: 4 10*3/uL (ref 0.7–4.0)
MCH: 27.1 pg (ref 26.0–34.0)
MCHC: 33.4 g/dL (ref 30.0–36.0)
MCV: 81 fL (ref 78.0–100.0)
MONO ABS: 0.4 10*3/uL (ref 0.1–1.0)
Monocytes Relative: 6 %
NEUTROS ABS: 2.7 10*3/uL (ref 1.7–7.7)
Neutrophils Relative %: 37 %
PLATELETS: 496 10*3/uL — AB (ref 150–400)
RBC: 4.36 MIL/uL (ref 3.87–5.11)
RDW: 15.1 % (ref 11.5–15.5)
WBC: 7.1 10*3/uL (ref 4.0–10.5)

## 2016-08-04 LAB — COMPREHENSIVE METABOLIC PANEL
ALBUMIN: 3.8 g/dL (ref 3.5–5.0)
ALT: 18 U/L (ref 14–54)
AST: 31 U/L (ref 15–41)
Alkaline Phosphatase: 96 U/L (ref 38–126)
Anion gap: 7 (ref 5–15)
BUN: 6 mg/dL (ref 6–20)
CHLORIDE: 106 mmol/L (ref 101–111)
CO2: 27 mmol/L (ref 22–32)
Calcium: 8.9 mg/dL (ref 8.9–10.3)
Creatinine, Ser: 0.86 mg/dL (ref 0.44–1.00)
GFR calc Af Amer: >60 mL/min (ref >60)
GFR calc non Af Amer: >60 mL/min (ref >60)
GLUCOSE: 87 mg/dL (ref 65–99)
POTASSIUM: 3.1 mmol/L — AB (ref 3.5–5.1)
SODIUM: 140 mmol/L (ref 135–145)
Total Bilirubin: 0.5 mg/dL (ref 0.3–1.2)
Total Protein: 6.9 g/dL (ref 6.5–8.1)

## 2016-08-04 LAB — HCG, QUANTITATIVE, PREGNANCY: hCG, Beta Chain, Quant, S: <1 m[IU]/mL (ref <5)

## 2016-08-04 LAB — LIPASE, BLOOD: Lipase: 23 U/L (ref 11–51)

## 2016-08-04 LAB — LACTIC ACID, PLASMA: Lactic Acid, Venous: 0.8 mmol/L (ref 0.5–1.9)

## 2016-08-04 MED ORDER — DIATRIZOATE MEGLUMINE & SODIUM 66-10 % PO SOLN: 90.0000 mL | Freq: Once | ORAL | Status: DC

## 2016-08-04 MED ORDER — GI COCKTAIL ~~LOC~~: 30.0000 mL | Freq: Two times a day (BID) | ORAL | 0 refills | Status: AC

## 2016-08-04 MED ORDER — GI COCKTAIL ~~LOC~~
30.0000 mL | Freq: Once | ORAL | Status: AC
  Administered 2016-08-04: 30 mL via ORAL
  Filled 2016-08-04: qty 30

## 2016-08-04 MED ORDER — IOPAMIDOL (ISOVUE-300) INJECTION 61%
15.0000 mL | Freq: Once | INTRAVENOUS | Status: DC | PRN
  Administered 2016-08-04: 15 mL via ORAL
  Filled 2016-08-04: qty 30

## 2016-08-04 MED ORDER — IOPAMIDOL (ISOVUE-300) INJECTION 61%
INTRAVENOUS | Status: AC
  Filled 2016-08-04: qty 30

## 2016-08-04 MED ORDER — SODIUM CHLORIDE 0.9 % IV BOLUS (SEPSIS)
1000.0000 mL | Freq: Once | INTRAVENOUS | Status: AC
  Administered 2016-08-04: 1000 mL via INTRAVENOUS

## 2016-08-04 NOTE — ED Triage Notes (Signed)
Pt from home with abdominal pain. Pt had an endoscopy and colonoscopy yesterday. Pt states she has a burning pain in her upper and lower middle abdominal area. Pt had a polyp removed and was diagnosed with gastritis during the procedures yesterday. Pt denies emesis. Pt has had 1 bm today and denies blood. Pt describes the pain as a burning and throbbing feeling.

## 2016-08-04 NOTE — ED Provider Notes (Signed)
[facility] DEPT Provider Note   CSN: [id number] Arrival date & time: 08/04/16  1527     History   Chief Complaint Chief Complaint  Patient presents with  . Abdominal Pain    HPI Laura Horton is a 40 y.o. female.  The history is provided by the patient.  Abdominal Pain   This is a new problem. The current episode started yesterday. The problem occurs constantly. The problem has been gradually improving. Associated with: had colonoscopy and endoscopy yesterday. The pain is located in the epigastric region and chest. The quality of the pain is burning. The pain is moderate. Associated symptoms include nausea. Pertinent negatives include fever, diarrhea, vomiting, dysuria, hematuria and arthralgias. The symptoms are aggravated by eating. Relieved by: mylanta. Past workup includes GI consult.    Past Medical History:  Diagnosis Date  . ADHD (attention deficit hyperactivity disorder)   . Anxiety   . Bipolar 1 disorder ([place])   . Chronic back pain   . Depression   . Diabetes mellitus without complication [facility])     Patient Active Problem List   Diagnosis Date Noted  . Controlled diabetes mellitus without complication ([place]) [id number]  . Left knee pain 01/05/2016  . Faintness 12/31/2015  . Syncope 12/31/2015  . Essential hypertension 12/31/2015  . Elevated blood pressure 09/07/2015  . Nausea with vomiting 04/21/2015  . Prediabetes 04/21/2015  . Vitamin D deficiency 12/23/2014  . History of vitamin D deficiency 08/24/2014  . Menorrhagia 08/24/2014  . Sinusitis, bacterial 07/02/2014  . Abdominal pain, chronic, right lower quadrant 06/05/2014  . Snoring 05/05/2014  . Back pain 05/05/2014  . Pain in left shin 02/27/2014  . Nausea alone 01/29/2014  . Seasonal allergies 12/19/2013  . Blurry vision 11/04/2013  . ADD (attention deficit disorder) 07/06/2013  . INSOMNIA, CHRONIC 03/10/2010  . 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 Obsessive-compulsive disorder  02/24/2010  . Obesity 09/08/2009  . Bipolar disorder ([place]) 09/08/2009    Past Surgical History:  Procedure Laterality Date  . CHOLECYSTECTOMY      OB History    No data available       Home Medications    Prior to Admission medications   Medication Sig Start Date End Date Taking? Authorizing Provider  amphetamine-dextroamphetamine (ADDERALL) 30 MG tablet Take 15 mg by mouth daily. 10/01/15  Yes Historical Provider, MD  Biotin 10000 MCG TABS Take 1 tablet by mouth daily.   Yes Historical Provider, MD  cetirizine (ZYRTEC) 10 MG tablet Take 1 tablet (10 mg total) by mouth daily. 01/29/14  Yes Melony Overly, MD  clonazePAM (KLONOPIN) 2 MG tablet Take 2 mg by mouth 3 (three) times daily.  01/26/16  Yes Historical Provider, MD  cloNIDine (CATAPRES) 0.2 MG tablet Take 1 tablet (0.2 mg total) by mouth 4 (four) times daily. Patient taking differently: Take 0.6 mg by mouth at bedtime.  09/07/15  Yes Frazier Richards, MD  flurazepam [facility]) 30 MG capsule Take 30 mg by mouth at bedtime as needed for sleep.   Yes Historical Provider, MD  hydrOXYzine (VISTARIL) 50 MG capsule Take 1 capsule (50 mg total) by mouth every 8 (eight) hours as needed. Patient taking differently: Take 100 mg by mouth at bedtime as needed for anxiety (sleep).  04/21/15  Yes Frazier Richards, MD  ibuprofen (ADVIL,MOTRIN) 800 MG tablet Take 800 mg by mouth every 8 (eight) hours as needed for headache or moderate pain.   Yes Historical Provider, MD  Levonorgestrel-Ethinyl Estradiol (AMETHIA)  0.15-0.03 &0.01 MG tablet Take 1 tablet by mouth daily. 08/24/14  Yes Frazier Richards, MD  metFORMIN (GLUCOPHAGE-XR) 500 MG 24 hr tablet Take 500 mg by mouth daily. 01/21/16  Yes Historical Provider, MD  methocarbamol (ROBAXIN) 500 MG tablet Take 1 tablet (500 mg total) by mouth every 8 (eight) hours as needed for muscle spasms. 12/30/15  Yes [place] ([facility]) POWD Take 3 scoop by mouth daily as needed (knee pain).   Yes  Historical Provider, MD  mometasone (NASONEX) 50 MCG/ACT nasal spray Place 2 sprays into the nose daily. 02/27/14  Yes Melony Overly, MD  montelukast (SINGULAIR) 10 MG tablet Take 1 tablet (10 mg total) by mouth at bedtime. 12/24/14  Yes Frazier Richards, MD  polyethylene glycol powder (GLYCOLAX/MIRALAX) powder Take 17 g by mouth 2 (two) times daily as needed for moderate constipation. Take 1 capful in 8 ounces of water every 30 minutes for 8 doses Patient taking differently: Take 17 g by mouth daily as needed for moderate constipation. Take 1 capful in 8 ounces of water every 30 minutes for 8 doses 04/21/15  Yes Frazier Richards, MD  albuterol (PROVENTIL HFA;VENTOLIN HFA) 108 (90 BASE) MCG/ACT inhaler Inhale 2 puffs into the lungs every 6 (six) hours as needed for wheezing or shortness of breath. Patient not taking: Reported on 12/31/2015 09/23/14   Frazier Richards, MD  Alum & Mag Hydroxide-Simeth (GI COCKTAIL) SUSP suspension Take 30 mLs by mouth 2 (two) times daily. Shake well. 08/04/16 08/09/16  Fatima Blank, MD  Cholecalciferol 1000 UNITS tablet Take 1 tablet (1,000 Units total) by mouth daily. Patient not taking: Reported on 08/04/2016 02/16/15   Frazier Richards, MD  escitalopram (LEXAPRO) 20 MG tablet Take 1 tablet (20 mg total) by mouth daily. Patient not taking: Reported on 12/31/2015 09/07/15   Frazier Richards, MD  HYDROcodone-acetaminophen [facility]) 5-325 MG tablet Take 1 tablet by mouth every 6 (six) hours as needed for moderate pain. Patient not taking: Reported on 04/16/2016 01/05/16   Dene Gentry, MD  meloxicam (MOBIC) 15 MG tablet Take 1 tablet (15 mg total) by mouth daily. Patient not taking: Reported on 08/04/2016 03/31/16   Dene Gentry, MD  Multiple Vitamins-Minerals (MULTIVITAMIN) tablet Take 1 tablet by mouth daily. Patient not taking: Reported on 04/16/2016 06/06/14   Olam Idler, MD  omeprazole (PRILOSEC) 20 MG capsule Take 1 capsule (20 mg total) by mouth daily as needed (for reflux). For  2 weeks, then as needed. Patient not taking: Reported on 12/31/2015 07/05/15   Frazier Richards, MD  oxyCODONE-acetaminophen (PERCOCET/ROXICET) 5-325 MG tablet Take 1 tablet by mouth every 8 (eight) hours as needed for severe pain. Patient not taking: Reported on 08/04/2016 04/16/16   Shary Decamp, PA-C  ramelteon (ROZEREM) 8 MG tablet Take 1 tablet (8 mg total) by mouth at bedtime. Patient not taking: Reported on 12/31/2015 12/23/14   Frazier Richards, MD    Family History Family History  Problem Relation Age of Onset  . Heart disease Mother   . Hypertension Mother   . Hyperlipidemia Mother   . Cancer Father     LUNG  . Mental illness Maternal Grandmother   . Cancer Maternal Grandmother     breast  . Depression Maternal Grandmother   . Bipolar disorder Maternal Aunt   . ADD / ADHD Cousin     Social History Social History  Substance Use Topics  . Smoking status: Never  Smoker  . Smokeless tobacco: Never Used  . Alcohol use No     Allergies   Carbamazepine; Quetiapine; and Ziprasidone mesylate   Review of Systems Review of Systems  Constitutional: Negative for chills and fever.  HENT: Negative for ear pain and sore throat.   Eyes: Negative for pain and visual disturbance.  Respiratory: Negative for cough and shortness of breath.   Cardiovascular: Negative for chest pain and palpitations.  Gastrointestinal: Positive for abdominal pain and nausea. Negative for diarrhea and vomiting.  Genitourinary: Negative for dysuria and hematuria.  Musculoskeletal: Negative for arthralgias and back pain.  Skin: Negative for color change and rash.  Neurological: Negative for seizures and syncope.  All other systems reviewed and are negative.    Physical Exam Updated Vital Signs BP 140/92 (BP Location: Right Arm)   Pulse 65   Temp 97.8 F (36.6 C) (Oral)   Resp 16   Ht 5\' 3"  (1.6 m)   Wt 213 lb (96.6 kg)   SpO2 100%   BMI 37.73 kg/m   Physical Exam  Constitutional: She is oriented  to person, place, and time. She appears well-developed and well-nourished. No distress.  HENT:  Head: Normocephalic and atraumatic.  Nose: Nose normal.  Eyes: Conjunctivae and EOM are normal. Pupils are equal, round, and reactive to light. Right eye exhibits no discharge. Left eye exhibits no discharge. No scleral icterus.  Neck: Normal range of motion. Neck supple.  Cardiovascular: Normal rate and regular rhythm.  Exam reveals no gallop and no friction rub.   No murmur heard. Pulmonary/Chest: Effort normal and breath sounds normal. No stridor. No respiratory distress. She has no rales.  Abdominal: Soft. She exhibits no distension. There is tenderness in the right upper quadrant, epigastric area and periumbilical area. There is no rigidity, no rebound, no guarding and no CVA tenderness.  Musculoskeletal: She exhibits no edema or tenderness.  Neurological: She is alert and oriented to person, place, and time.  Skin: Skin is warm and dry. No rash noted. She is not diaphoretic. No erythema.  Psychiatric: She has a normal mood and affect.  Vitals reviewed.    ED Treatments / Results  Labs (all labs ordered are listed, but only abnormal results are displayed) Labs Reviewed  CBC WITH DIFFERENTIAL/PLATELET - Abnormal; Notable for the following:       Result Value   Hemoglobin 11.8 (*)    HCT 35.3 (*)    Platelets 496 (*)    All other components within normal limits  COMPREHENSIVE METABOLIC PANEL - Abnormal; Notable for the following:    Potassium 3.1 (*)    All other components within normal limits  LIPASE, BLOOD  LACTIC ACID, PLASMA  HCG, QUANTITATIVE, PREGNANCY    EKG  EKG Interpretation None       Radiology Ct Abdomen Pelvis Wo Contrast  Result Date: 08/04/2016 CLINICAL DATA:  Chest and abdominal pain following endoscopy and colonoscopy yesterday. Colonic polypectomy performed. EXAM: CT CHEST, ABDOMEN AND PELVIS WITHOUT CONTRAST TECHNIQUE: Multidetector CT imaging of the  chest, abdomen and pelvis was performed following the standard protocol without IV contrast. COMPARISON:  None. FINDINGS: CT CHEST FINDINGS Cardiovascular:  No acute findings. Mediastinum/Lymph Nodes: No masses or pathologically enlarged lymph nodes identified on this unenhanced exam. No evidence of pneumomediastinum. Lungs/Pleura: No pulmonary infiltrate or mass identified. No pleural effusion or pneumothorax present. Musculoskeletal: No suspicious bone lesions or other significant abnormality. CT ABDOMEN AND PELVIS FINDINGS Hepatobiliary: No masses visualized on this unenhanced exam. Prior  cholecystectomy. Pancreas: No mass or inflammatory changes identified on this unenhanced exam. Spleen: Within normal limits in size. Adrenals/Urinary Tract: No evidence of urolithiasis or hydronephrosis. Unremarkable appearance of bladder. Stomach/Bowel: No evidence of obstruction, inflammatory process, or abnormal fluid collections. Normal appendix visualized. Vascular/Lymphatic: No pathologically enlarged lymph nodes identified. No abdominal aortic aneurysm. Reproductive:  No mass or other significant abnormality identified. Other:  No evidence of free intraperitoneal air or hemoperitoneum. Musculoskeletal:  No suspicious bone lesions identified. IMPRESSION: No acute findings or other significant abnormality identified. Electronically Signed   By: Earle Gell M.D.   On: 08/04/2016 20:14   Ct Chest Wo Contrast  Result Date: 08/04/2016 CLINICAL DATA:  Chest and abdominal pain following endoscopy and colonoscopy yesterday. Colonic polypectomy performed. EXAM: CT CHEST, ABDOMEN AND PELVIS WITHOUT CONTRAST TECHNIQUE: Multidetector CT imaging of the chest, abdomen and pelvis was performed following the standard protocol without IV contrast. COMPARISON:  None. FINDINGS: CT CHEST FINDINGS Cardiovascular:  No acute findings. Mediastinum/Lymph Nodes: No masses or pathologically enlarged lymph nodes identified on this unenhanced  exam. No evidence of pneumomediastinum. Lungs/Pleura: No pulmonary infiltrate or mass identified. No pleural effusion or pneumothorax present. Musculoskeletal: No suspicious bone lesions or other significant abnormality. CT ABDOMEN AND PELVIS FINDINGS Hepatobiliary: No masses visualized on this unenhanced exam. Prior cholecystectomy. Pancreas: No mass or inflammatory changes identified on this unenhanced exam. Spleen: Within normal limits in size. Adrenals/Urinary Tract: No evidence of urolithiasis or hydronephrosis. Unremarkable appearance of bladder. Stomach/Bowel: No evidence of obstruction, inflammatory process, or abnormal fluid collections. Normal appendix visualized. Vascular/Lymphatic: No pathologically enlarged lymph nodes identified. No abdominal aortic aneurysm. Reproductive:  No mass or other significant abnormality identified. Other:  No evidence of free intraperitoneal air or hemoperitoneum. Musculoskeletal:  No suspicious bone lesions identified. IMPRESSION: No acute findings or other significant abnormality identified. Electronically Signed   By: Earle Gell M.D.   On: 08/04/2016 20:14    Procedures Procedures (including critical care time)  Medications Ordered in ED Medications  sodium chloride 0.9 % bolus 1,000 mL (0 mLs Intravenous Stopped 08/04/16 2108)  gi cocktail (Maalox,Lidocaine,Donnatal) (30 mLs Oral Given 08/04/16 1711)     Initial Impression / Assessment and Plan / ED Course  I have reviewed the triage vital signs and the nursing notes.  Pertinent labs & imaging results that were available during my care of the patient were reviewed by me and considered in my medical decision making (see chart for details).  Clinical Course     Labs reassuring. CT with no evidence of GI perforation. Pain likely secondary to biopsies taken yesterday and during the EGD and colonoscopy. Gi cocktail chief complete resolution of patient's symptoms.  Discharge with strict return  precautions.  Final Clinical Impressions(s) / ED Diagnoses   Final diagnoses:  Epigastric pain  Epigastric pain   Disposition: Discharge  Condition: Good  I have discussed the results, Dx and Tx plan with the patient who expressed understanding and agree(s) with the plan. Discharge instructions discussed at great length. The patient was given strict return precautions who verbalized understanding of the instructions. No further questions at time of discharge.    Discharge Medication List as of 08/04/2016  8:48 PM    START taking these medications   Details  Alum & Mag Hydroxide-Simeth (GI COCKTAIL) SUSP suspension Take 30 mLs by mouth 2 (two) times daily. Shake well., Starting Fri 08/04/2016, Until Wed 08/09/2016, Print        Follow Up: Katherina Mires, MD [address]  [place] [address] [phone]  Schedule an appointment as soon as possible for a visit  As needed  Gastroenterology   as scheduled      Fatima Blank, MD 08/05/16 [phone]

## 2016-08-07 ENCOUNTER — Other Ambulatory Visit: Payer: Self-pay | Admitting: Family Medicine

## 2016-08-07 DIAGNOSIS — Z1231 Encounter for screening mammogram for malignant neoplasm of breast: Secondary | ICD-10-CM

## 2016-08-08 ENCOUNTER — Telehealth: Payer: Self-pay | Admitting: Family Medicine

## 2016-08-08 NOTE — Telephone Encounter (Signed)
We will likely do x-rays during her visit but I will talk to her first and examine her.

## 2016-08-08 NOTE — Telephone Encounter (Signed)
Tried to call patient and got no answer or voicemail.

## 2016-08-09 ENCOUNTER — Encounter: Payer: Medicare Other | Admitting: Family Medicine

## 2016-08-24 DIAGNOSIS — D126 Benign neoplasm of colon, unspecified: Secondary | ICD-10-CM | POA: Insufficient documentation

## 2016-08-24 DIAGNOSIS — B9681 Helicobacter pylori [H. pylori] as the cause of diseases classified elsewhere: Secondary | ICD-10-CM | POA: Insufficient documentation

## 2016-08-24 DIAGNOSIS — K2970 Gastritis, unspecified, without bleeding: Secondary | ICD-10-CM

## 2016-08-31 ENCOUNTER — Encounter (HOSPITAL_COMMUNITY): Payer: Self-pay | Admitting: Emergency Medicine

## 2016-08-31 ENCOUNTER — Emergency Department (HOSPITAL_COMMUNITY): Payer: Medicare Other

## 2016-08-31 ENCOUNTER — Emergency Department (HOSPITAL_COMMUNITY)
Admission: EM | Admit: 2016-08-31 | Discharge: 2016-08-31 | Disposition: A | Discharge status: Home / Self Care | Payer: Medicare Other | Attending: Emergency Medicine | Admitting: Emergency Medicine

## 2016-08-31 DIAGNOSIS — Z79899 Other long term (current) drug therapy: Secondary | ICD-10-CM | POA: Insufficient documentation

## 2016-08-31 DIAGNOSIS — F909 Attention-deficit hyperactivity disorder, unspecified type: Secondary | ICD-10-CM | POA: Insufficient documentation

## 2016-08-31 DIAGNOSIS — Z7984 Long term (current) use of oral hypoglycemic drugs: Secondary | ICD-10-CM | POA: Insufficient documentation

## 2016-08-31 DIAGNOSIS — R1084 Generalized abdominal pain: Secondary | ICD-10-CM | POA: Diagnosis present

## 2016-08-31 DIAGNOSIS — R1013 Epigastric pain: Secondary | ICD-10-CM | POA: Diagnosis not present

## 2016-08-31 DIAGNOSIS — E119 Type 2 diabetes mellitus without complications: Secondary | ICD-10-CM | POA: Diagnosis not present

## 2016-08-31 LAB — I-STAT TROPONIN, ED: TROPONIN I, POC: 0 ng/mL (ref 0.00–0.08)

## 2016-08-31 LAB — BASIC METABOLIC PANEL
ANION GAP: 10 (ref 5–15)
BUN: 7 mg/dL (ref 6–20)
CALCIUM: 9 mg/dL (ref 8.9–10.3)
CO2: 25 mmol/L (ref 22–32)
Chloride: 101 mmol/L (ref 101–111)
Creatinine, Ser: 0.88 mg/dL (ref 0.44–1.00)
GFR calc Af Amer: >60 mL/min (ref >60)
GLUCOSE: 91 mg/dL (ref 65–99)
Potassium: 3.5 mmol/L (ref 3.5–5.1)
Sodium: 136 mmol/L (ref 135–145)

## 2016-08-31 LAB — I-STAT BETA HCG BLOOD, ED (MC, WL, AP ONLY): I-stat hCG, quantitative: <5 m[IU]/mL (ref <5)

## 2016-08-31 LAB — URINALYSIS, ROUTINE W REFLEX MICROSCOPIC
Bilirubin Urine: NEGATIVE
GLUCOSE, UA: NEGATIVE mg/dL
Ketones, ur: NEGATIVE mg/dL
Leukocytes, UA: NEGATIVE
NITRITE: NEGATIVE
Protein, ur: NEGATIVE mg/dL
SPECIFIC GRAVITY, URINE: 1.002 — AB (ref 1.005–1.030)
pH: 5 (ref 5.0–8.0)

## 2016-08-31 LAB — CBC
HCT: 40.2 % (ref 36.0–46.0)
Hemoglobin: 13.4 g/dL (ref 12.0–15.0)
MCH: 27.1 pg (ref 26.0–34.0)
MCHC: 33.3 g/dL (ref 30.0–36.0)
MCV: 81.2 fL (ref 78.0–100.0)
Platelets: 491 10*3/uL — ABNORMAL HIGH (ref 150–400)
RBC: 4.95 MIL/uL (ref 3.87–5.11)
RDW: 15.2 % (ref 11.5–15.5)
WBC: 6.2 10*3/uL (ref 4.0–10.5)

## 2016-08-31 LAB — CBG MONITORING, ED: Glucose-Capillary: 77 mg/dL (ref 65–99)

## 2016-08-31 LAB — LIPASE, BLOOD: Lipase: 16 U/L (ref 11–51)

## 2016-08-31 MED ORDER — GI COCKTAIL ~~LOC~~
30.0000 mL | Freq: Once | ORAL | Status: AC
  Administered 2016-08-31: 30 mL via ORAL
  Filled 2016-08-31: qty 30

## 2016-08-31 MED ORDER — SUCRALFATE 1 GM/10ML PO SUSP
1.0000 g | Freq: Three times a day (TID) | ORAL | Status: DC
  Administered 2016-08-31: 1 g via ORAL
  Filled 2016-08-31: qty 10

## 2016-08-31 MED ORDER — SUCRALFATE 1 GM/10ML PO SUSP
1.0000 g | Freq: Three times a day (TID) | ORAL | 0 refills | Status: DC
Start: 2016-08-31 — End: 2017-05-23

## 2016-08-31 NOTE — ED Notes (Signed)
Pt wants to "wait to get an IV unless we have to have one."

## 2016-08-31 NOTE — ED Provider Notes (Signed)
[facility] DE[facility] Provider Note   CSN: [id number] Arrival date & time: 08/31/16  [id number]     History   Chief Complaint Chief Complaint  Patient presents with  . Near Syncope    HPI Laura Horton is a 40 y.o. female. with history of Dm2 and diagnosed H. pylori on 12/14 presenting with 2 episode of shaking last night for about 5 minutes each and diffuse abdominal pain. Patient reports having an endoscopy done last month which showed H. pylori infection. She states that she was then put on treatment 2 weeks ago with Pyelera and has had worsening associated central chest pain/burning with radiation to her throat, diffuse abdominal pain especially epigastric, diarrhea, emesis yesterday and melena. Patient also admits to sore throat and decreased appetite since endoscopy. Patient reports pylera worsening her symptoms. Patient states that she went to 5 days worth of the regimen but cannot tolerate the medications. Patient seen soon after endoscopy with similar complaints. Patient also reports 2 shaking episodes last night lasting 5 minutes each. She states that she's never had this before but she does not think it was a seizure. Patient denies LOC, trauma, or history of seizures. Patient reports taking her regular daily medications as prescribed.  Patient denies fevers.   HPI  Past Medical History:  Diagnosis Date  . ADHD (attention deficit hyperactivity disorder)   . Anxiety   . Bipolar 1 disorder ([place])   . Chronic back pain   . Depression   . Diabetes mellitus without complication [facility])     Patient Active Problem List   Diagnosis Date Noted  . Controlled diabetes mellitus without complication ([place]) [id number]  . Left knee pain 01/05/2016  . Faintness 12/31/2015  . Syncope 12/31/2015  . Essential hypertension 12/31/2015  . Elevated blood pressure 09/07/2015  . Nausea with vomiting 04/21/2015  . Prediabetes 04/21/2015  . Vitamin D deficiency 12/23/2014  . History of vitamin  D deficiency 08/24/2014  . Menorrhagia 08/24/2014  . Sinusitis, bacterial 07/02/2014  . Abdominal pain, chronic, right lower quadrant 06/05/2014  . Snoring 05/05/2014  . Back pain 05/05/2014  . Pain in left shin 02/27/2014  . Nausea alone 01/29/2014  . Seasonal allergies 12/19/2013  . Blurry vision 11/04/2013  . ADD (attention deficit disorder) 07/06/2013  . INSOMNIA, CHRONIC 03/10/2010  . 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 Obsessive-compulsive disorder 02/24/2010  . Obesity 09/08/2009  . Bipolar disorder ([place]) 09/08/2009    Past Surgical History:  Procedure Laterality Date  . CHOLECYSTECTOMY      OB History    No data available       Home Medications    Prior to Admission medications   Medication Sig Start Date End Date Taking? Authorizing Provider  amphetamine-dextroamphetamine (ADDERALL) 30 MG tablet Take 15 mg by mouth daily. 10/01/15  Yes Historical Provider, MD  bismuth-metronidazole-tetracycline (PYLERA) 140-125-125 MG capsule Take 3 capsules by mouth 4 (four) times daily -  before meals and at bedtime.   Yes Historical Provider, MD  cetirizine (ZYRTEC) 10 MG tablet Take 1 tablet (10 mg total) by mouth daily. 01/29/14  Yes Melony Overly, MD  clonazePAM (KLONOPIN) 2 MG tablet Take 2 mg by mouth 3 (three) times daily.  01/26/16  Yes Historical Provider, MD  cloNIDine (CATAPRES) 0.2 MG tablet Take 1 tablet (0.2 mg total) by mouth 4 (four) times daily. Patient taking differently: Take 0.6 mg by mouth at bedtime.  09/07/15  Yes Frazier Richards, MD  flurazepam [facility]) 30 MG capsule  Take 30 mg by mouth at bedtime as needed for sleep.   Yes Historical Provider, MD  hydrOXYzine (VISTARIL) 50 MG capsule Take 1 capsule (50 mg total) by mouth every 8 (eight) hours as needed. Patient taking differently: Take 100 mg by mouth at bedtime as needed for anxiety (sleep).  04/21/15  Yes Frazier Richards, MD  metFORMIN (GLUCOPHAGE-XR) 500 MG 24 hr tablet Take 500 mg by mouth daily.  01/21/16  Yes Historical Provider, MD  mometasone (NASONEX) 50 MCG/ACT nasal spray Place 2 sprays into the nose daily. 02/27/14  Yes Melony Overly, MD  naproxen sodium (ANAPROX) 220 MG tablet Take 440 mg by mouth 2 (two) times daily with a meal.   Yes Historical Provider, MD  omeprazole (PRILOSEC) 20 MG capsule Take 1 capsule (20 mg total) by mouth daily as needed (for reflux). For 2 weeks, then as needed. Patient taking differently: Take 20 mg by mouth as directed. Take for 10 days in conjunction with pylera.. Take 20mg  omeprazole after morning meal and 20mg  after evening meal 07/05/15  Yes Frazier Richards, MD  polyethylene glycol powder (GLYCOLAX/MIRALAX) powder Take 17 g by mouth 2 (two) times daily as needed for moderate constipation. Take 1 capful in 8 ounces of water every 30 minutes for 8 doses Patient taking differently: Take 17 g by mouth daily as needed for moderate constipation. Take 1 capful in 8 ounces of water every 30 minutes for 8 doses 04/21/15  Yes Frazier Richards, MD  albuterol (PROVENTIL HFA;VENTOLIN HFA) 108 (90 BASE) MCG/ACT inhaler Inhale 2 puffs into the lungs every 6 (six) hours as needed for wheezing or shortness of breath. Patient not taking: Reported on 12/31/2015 09/23/14   Frazier Richards, MD  Biotin 10000 MCG TABS Take 1 tablet by mouth daily.    Historical Provider, MD  Cholecalciferol 1000 UNITS tablet Take 1 tablet (1,000 Units total) by mouth daily. Patient not taking: Reported on 08/04/2016 02/16/15   Frazier Richards, MD  ciprofloxacin (CIPRO) 500 MG tablet Take 500 mg by mouth 2 (two) times daily.  08/25/16   Historical Provider, MD  escitalopram (LEXAPRO) 20 MG tablet Take 1 tablet (20 mg total) by mouth daily. Patient not taking: Reported on 12/31/2015 09/07/15   Frazier Richards, MD  HYDROcodone-acetaminophen [facility]) 5-325 MG tablet Take 1 tablet by mouth every 6 (six) hours as needed for moderate pain. Patient not taking: Reported on 04/16/2016 01/05/16   Dene Gentry, MD    Levonorgestrel-Ethinyl Estradiol (AMETHIA) 0.15-0.03 &0.01 MG tablet Take 1 tablet by mouth daily. Patient not taking: Reported on 08/31/2016 08/24/14   Frazier Richards, MD  meloxicam (MOBIC) 15 MG tablet Take 1 tablet (15 mg total) by mouth daily. Patient not taking: Reported on 08/04/2016 03/31/16   Dene Gentry, MD  methocarbamol (ROBAXIN) 500 MG tablet Take 1 tablet (500 mg total) by mouth every 8 (eight) hours as needed for muscle spasms. Patient not taking: Reported on 08/31/2016 12/30/15   Fransico Meadow, PA-C  montelukast (SINGULAIR) 10 MG tablet Take 1 tablet (10 mg total) by mouth at bedtime. Patient not taking: Reported on 08/31/2016 12/24/14   Frazier Richards, MD  Multiple Vitamins-Minerals (MULTIVITAMIN) tablet Take 1 tablet by mouth daily. Patient not taking: Reported on 04/16/2016 06/06/14   Olam Idler, MD  oxyCODONE-acetaminophen (PERCOCET/ROXICET) 5-325 MG tablet Take 1 tablet by mouth every 8 (eight) hours as needed for severe pain. Patient not taking: Reported on 08/04/2016 04/16/16  Shary Decamp, PA-C  ramelteon (ROZEREM) 8 MG tablet Take 1 tablet (8 mg total) by mouth at bedtime. Patient not taking: Reported on 12/31/2015 12/23/14   Frazier Richards, MD  sucralfate (CARAFATE) 1 GM/10ML suspension Take 10 mLs (1 g total) by mouth 4 (four) times daily -  with meals and at bedtime. 08/31/16   Muaz Shorey [place], [place]    Family History Family History  Problem Relation Age of Onset  . Heart disease Mother   . Hypertension Mother   . Hyperlipidemia Mother   . Cancer Father     LUNG  . Mental illness Maternal Grandmother   . Cancer Maternal Grandmother     breast  . Depression Maternal Grandmother   . Bipolar disorder Maternal Aunt   . ADD / ADHD Cousin     Social History Social History  Substance Use Topics  . Smoking status: Never Smoker  . Smokeless tobacco: Never Used  . Alcohol use No     Allergies   Carbamazepine; Quetiapine; and Ziprasidone  mesylate   Review of Systems Review of Systems  Constitutional: Positive for appetite change and chills. Negative for fever.  HENT: Positive for sore throat. Negative for trouble swallowing.   Eyes: Negative for visual disturbance.  Respiratory: Positive for cough and shortness of breath.   Cardiovascular: Positive for chest pain.  Gastrointestinal: Positive for abdominal pain, diarrhea and vomiting.  Genitourinary: Positive for dysuria. Negative for difficulty urinating.  Musculoskeletal: Positive for back pain. Negative for neck pain and neck stiffness.  Skin: Negative for rash and wound.  Neurological: Negative for seizures, syncope, facial asymmetry and speech difficulty.     Physical Exam Updated Vital Signs BP 122/72 (BP Location: Left Arm)   Pulse 77   Temp 97.5 F (36.4 C) (Oral)   Resp 18   Ht 5\' 3"  (1.6 m)   Wt 96.6 kg   LMP 08/19/2016   SpO2 100%   BMI 37.73 kg/m   Physical Exam  Constitutional: She appears well-developed and well-nourished. No distress.  HENT:  Head: Normocephalic and atraumatic.  Nose: Nose normal.  Mouth/Throat: Oropharynx is clear and moist.  Eyes: Conjunctivae and EOM are normal. Pupils are equal, round, and reactive to light.  Neck: Normal range of motion. Neck supple.  Cardiovascular: Normal rate and regular rhythm.   No murmur heard. Pulmonary/Chest: Effort normal and breath sounds normal. No respiratory distress. She has no wheezes. She exhibits no tenderness.  Abdominal: Soft. There is tenderness. There is no rebound, no guarding, no tenderness at McBurney's point and negative Murphy's sign.  Musculoskeletal: Normal range of motion. She exhibits no edema or tenderness.  Neurological: She is alert. No sensory deficit.  Cranial Nerves:  III,IV, VI: ptosis not present, extra-ocular movements intact bilaterally, direct and consensual pupillary light reflexes intact bilaterally V: facial sensation, jaw opening, and bite strength equal  bilaterally VII: eyebrow raise, eyelid close, smile, frown, pucker equal bilaterally VIII: hearing grossly normal bilaterally  IX,X: palate elevation and swallowing intact XI: bilateral shoulder shrug and lateral head rotation equal and strong XII: midline tongue extension  Negative pronator drift, negative finger to nose, negative heel-to-shin, negative RAM.  Patient able to ambulate without difficulty.  Skin: Skin is warm and dry.  Psychiatric: She has a normal mood and affect. Her behavior is normal.  Nursing note and vitals reviewed.    ED Treatments / Results  Labs (all labs ordered are listed, but only abnormal results are displayed) Labs Reviewed  CBC -  Abnormal; Notable for the following:       Result Value   Platelets 491 (*)    All other components within normal limits  URINALYSIS, ROUTINE W REFLEX MICROSCOPIC - Abnormal; Notable for the following:    Color, Urine STRAW (*)    Specific Gravity, Urine 1.002 (*)    Hgb urine dipstick MODERATE (*)    Bacteria, UA RARE (*)    Squamous Epithelial / LPF 0-5 (*)    All other components within normal limits  BASIC METABOLIC PANEL  LIPASE, BLOOD  CBG MONITORING, ED  I-STAT BETA HCG BLOOD, ED ([facility], [facility], [facility] ONLY)  I-STAT TROPOININ, ED    EKG  EKG Interpretation  Date/Time:  Thursday August 31 2016 08:43:33 EST Ventricular Rate:  92 PR Interval:    QRS Duration: 76 QT Interval:  458 QTC Calculation: 567 R Axis:   47 Text Interpretation:  Sinus rhythm Low voltage, precordial leads Borderline T abnormalities, anterior leads Minimal ST elevation, inferior leads Prolonged QT interval No STEMI.  Confirmed by LONG MD, JOSHUA [phone]) on 08/31/2016 9:23:13 AM       Radiology Dg Abd Acute W/chest  Result Date: 08/31/2016 CLINICAL DATA:  40 year old female status post cholecystectomy in October and endoscopy in November. Diarrhea, chest and abdominal burning and pain for 2 weeks. Initial encounter. EXAM: DG ABDOMEN ACUTE  W/ 1V CHEST COMPARISON:  CT chest abdomen and pelvis 08/04/2016, and earlier FINDINGS: Normal lung volumes. Normal cardiac size and mediastinal contours. The lungs are clear. No pneumothorax or pneumoperitoneum. EKG button artifact over both upper lobes. Stable cholecystectomy clips. Non obstructed bowel gas pattern. Abdominal and pelvic visceral contours are within normal limits. Pelvic phleboliths again noted. No acute osseous abnormality identified. IMPRESSION: 1.  Normal bowel gas pattern, no free air. 2.  Negative.  No acute cardiopulmonary abnormality. Electronically Signed   By: Genevie Ann M.D.   On: 08/31/2016 13:58    Procedures Procedures (including critical care time)  Medications Ordered in ED Medications  sucralfate (CARAFATE) 1 GM/10ML suspension 1 g (1 g Oral Given 08/31/16 1508)  gi cocktail (Maalox,Lidocaine,Donnatal) (30 mLs Oral Given 08/31/16 1354)     Initial Impression / Assessment and Plan / ED Course  I have reviewed the triage vital signs and the nursing notes.  Pertinent labs & imaging results that were available during my care of the patient were reviewed by me and considered in my medical decision making (see chart for details).  Clinical Course   Patient is a 40 year-old female presenting with worsening abdominal pain for 1 month and shaking episodes last night. Patient's symptoms all correlate with post-endoscopy and post treatment with pyelera. On exam patient is afebrile, hemodynamically stable, in no apparent distress. Abdomen soft and slightly tender. Guarding, or rebound tenderness. Negative Murphy sign. No focal tenderness at McBurney's point. More pain elicited in the epigastric region.  EKG shows no acute findings. Troponin negative. Lab work is otherwise normal. Abdominal acute with chest x-ray shows normal bowel gas pattern, no free air. No acute findings. CT abdomen pelvis without contrast done last month showing no acute findings, no evidence of  nephrolithiasis, urolithiasis or hydronephrosis. Shaking not concerning at this point given history, physical, lab findings of no electrolyte disbalance, no changes on EKG, or sign of infection. Does not sound like a seizure. Patient understood and agreed with assessment and plan. Patient afebrile, vital signs stable, and in no apparent distress. Patient instructed to follow up with her gastroenterologist today for  new regimen for her H. pylori infection. Patient also encouraged to maintain diet that is best for not aggravating reflux. Patient also recommended to follow up with her primary care physician and to 5 days. Return precautions given for any new or worsening symptoms such as fever, chills, increasing chest pain, shortness of breath.  Patient also seen by Dr. Laverta Baltimore.   Final Clinical Impressions(s) / ED Diagnoses   Final diagnoses:  Epigastric abdominal pain    New Prescriptions Discharge Medication List as of 08/31/2016  3:45 PM    START taking these medications   Details  sucralfate (CARAFATE) 1 GM/10ML suspension Take 10 mLs (1 g total) by mouth 4 (four) times daily -  with meals and at bedtime., Starting Thu 08/31/2016, [place], [place] 08/31/16 [place], [place] 08/31/16 1641    Margette Fast, MD 08/31/16 2014

## 2016-08-31 NOTE — ED Triage Notes (Addendum)
Patient reports she was dx with h. Pylori on 08/17/16. Patient started pylera capsules around that time. After starting the medication, patient started having blood in urine. Patient was dx with UTI and yeast infection. Patient was started on cipro. Patient then started having emesis. Also, last night patient had a near syncopal event. States she felt like she was going to pass out.

## 2016-08-31 NOTE — Discharge Instructions (Addendum)
Please schedule appointment with your gastroenterologist today to start new regimen for treatment of your H. Pylori infection. Make sure to drink plenty of fluids and take your daily medications. Make sure to maintain diet to no aggravate symptoms. Please follow-up with your primary care physician 2 to 5 days. Use carafate as needed.   Get help right away if: Your pain does not go away as soon as your health care provider told you to expect. You cannot stop throwing up. Your pain is only in areas of the abdomen, such as the right side or the left lower portion of the abdomen. You have bloody or black stools, or stools that look like tar. You have severe pain, cramping, or bloating in your abdomen. You have signs of dehydration, such as: Dark urine, very little urine, or no urine. Cracked lips. Dry mouth. Sunken eyes. Sleepiness. Weakness.

## 2016-08-31 NOTE — ED Notes (Signed)
Lab called. Lipase will be run as an add on

## 2016-09-11 ENCOUNTER — Ambulatory Visit
Admission: RE | Admit: 2016-09-11 | Discharge: 2016-09-11 | Disposition: A | Discharge status: Home / Self Care | Payer: Medicare Other | Source: Ambulatory Visit | Attending: Family Medicine | Admitting: Family Medicine

## 2016-09-11 DIAGNOSIS — Z1231 Encounter for screening mammogram for malignant neoplasm of breast: Secondary | ICD-10-CM

## 2016-11-22 ENCOUNTER — Encounter (HOSPITAL_COMMUNITY): Payer: Self-pay

## 2016-11-22 ENCOUNTER — Emergency Department (HOSPITAL_COMMUNITY)
Admission: EM | Admit: 2016-11-22 | Discharge: 2016-11-22 | Disposition: A | Discharge status: Home / Self Care | Payer: Medicare Other | Attending: Emergency Medicine | Admitting: Emergency Medicine

## 2016-11-22 ENCOUNTER — Emergency Department (HOSPITAL_COMMUNITY): Payer: Medicare Other

## 2016-11-22 DIAGNOSIS — Z7984 Long term (current) use of oral hypoglycemic drugs: Secondary | ICD-10-CM | POA: Insufficient documentation

## 2016-11-22 DIAGNOSIS — Y999 Unspecified external cause status: Secondary | ICD-10-CM | POA: Diagnosis not present

## 2016-11-22 DIAGNOSIS — S99912A Unspecified injury of left ankle, initial encounter: Secondary | ICD-10-CM | POA: Diagnosis present

## 2016-11-22 DIAGNOSIS — E119 Type 2 diabetes mellitus without complications: Secondary | ICD-10-CM | POA: Diagnosis not present

## 2016-11-22 DIAGNOSIS — Y939 Activity, unspecified: Secondary | ICD-10-CM | POA: Diagnosis not present

## 2016-11-22 DIAGNOSIS — S93402A Sprain of unspecified ligament of left ankle, initial encounter: Secondary | ICD-10-CM | POA: Insufficient documentation

## 2016-11-22 DIAGNOSIS — F909 Attention-deficit hyperactivity disorder, unspecified type: Secondary | ICD-10-CM | POA: Insufficient documentation

## 2016-11-22 DIAGNOSIS — I10 Essential (primary) hypertension: Secondary | ICD-10-CM | POA: Diagnosis not present

## 2016-11-22 DIAGNOSIS — Y929 Unspecified place or not applicable: Secondary | ICD-10-CM | POA: Insufficient documentation

## 2016-11-22 DIAGNOSIS — W1839XA Other fall on same level, initial encounter: Secondary | ICD-10-CM | POA: Diagnosis not present

## 2016-11-22 NOTE — ED Provider Notes (Signed)
[facility] DEPT Provider Note   By signing my name below, I, Bea Graff, attest that this documentation has been prepared under the direction and in the presence of [employer], PA-C. Electronically Signed: Bea Graff, ED Scribe. 11/22/16. 12:28 PM.    History   Chief Complaint Chief Complaint  Patient presents with  . Ankle Pain    LEFT   The history is provided by the patient and medical records. No language interpreter was used.    Laura Horton is a 41 y.o. female with PMHx of DM who presents to the Emergency Department complaining of a left ankle injury that occurred three days ago. She reports associated moderate pain, swelling and bruising. Pt states she had dizziness leading to a syncopal episode and when she regained consciousness she was holding her left ankle. She has seen her PCP PTA to be checked for reasons leading to the syncope who recommended she be seen here for X-Rays of the ankle. Pt is not having any symptoms presently. She has not taken anything for pain relief. Bearing weight and walking increases her pain. She denies alleviating factors. She denies numbness, tingling or weakness of the left foot or LLE.   Past Medical History:  Diagnosis Date  . ADHD (attention deficit hyperactivity disorder)   . Anxiety   . Bipolar 1 disorder ([place])   . Chronic back pain   . Depression   . Diabetes mellitus without complication [facility])     Patient Active Problem List   Diagnosis Date Noted  . Controlled diabetes mellitus without complication ([place]) 16/09/930  . Left knee pain 01/05/2016  . Faintness 12/31/2015  . Syncope 12/31/2015  . Essential hypertension 12/31/2015  . Elevated blood pressure 09/07/2015  . Nausea with vomiting 04/21/2015  . Prediabetes 04/21/2015  . Vitamin D deficiency 12/23/2014  . History of vitamin D deficiency 08/24/2014  . Menorrhagia 08/24/2014  . Sinusitis, bacterial 07/02/2014  . Abdominal pain, chronic, right lower  quadrant 06/05/2014  . Snoring 05/05/2014  . Back pain 05/05/2014  . Pain in left shin 02/27/2014  . Nausea alone 01/29/2014  . Seasonal allergies 12/19/2013  . Blurry vision 11/04/2013  . ADD (attention deficit disorder) 07/06/2013  . INSOMNIA, CHRONIC 03/10/2010  . 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 Obsessive-compulsive disorder 02/24/2010  . Obesity 09/08/2009  . Bipolar disorder ([place]) 09/08/2009    Past Surgical History:  Procedure Laterality Date  . CHOLECYSTECTOMY      OB History    No data available       Home Medications    Prior to Admission medications   Medication Sig Start Date End Date Taking? Authorizing Provider  albuterol (PROVENTIL HFA;VENTOLIN HFA) 108 (90 BASE) MCG/ACT inhaler Inhale 2 puffs into the lungs every 6 (six) hours as needed for wheezing or shortness of breath. Patient not taking: Reported on 12/31/2015 09/23/14   Frazier Richards, MD  amphetamine-dextroamphetamine (ADDERALL) 30 MG tablet Take 15 mg by mouth daily. 10/01/15   Historical Provider, MD  Biotin 10000 MCG TABS Take 1 tablet by mouth daily.    Historical Provider, MD  bismuth-metronidazole-tetracycline [facility]) [phone] MG capsule Take 3 capsules by mouth 4 (four) times daily -  before meals and at bedtime.    Historical Provider, MD  cetirizine (ZYRTEC) 10 MG tablet Take 1 tablet (10 mg total) by mouth daily. 01/29/14   Melony Overly, MD  Cholecalciferol 1000 UNITS tablet Take 1 tablet (1,000 Units total) by mouth daily. Patient not taking: Reported  on 08/04/2016 02/16/15   Frazier Richards, MD  ciprofloxacin (CIPRO) 500 MG tablet Take 500 mg by mouth 2 (two) times daily.  08/25/16   Historical Provider, MD  clonazePAM (KLONOPIN) 2 MG tablet Take 2 mg by mouth 3 (three) times daily.  01/26/16   Historical Provider, MD  cloNIDine (CATAPRES) 0.2 MG tablet Take 1 tablet (0.2 mg total) by mouth 4 (four) times daily. Patient taking differently: Take 0.6 mg by mouth at bedtime.  09/07/15    Frazier Richards, MD  escitalopram (LEXAPRO) 20 MG tablet Take 1 tablet (20 mg total) by mouth daily. Patient not taking: Reported on 12/31/2015 09/07/15   Frazier Richards, MD  flurazepam [facility]) 30 MG capsule Take 30 mg by mouth at bedtime as needed for sleep.    Historical Provider, MD  HYDROcodone-acetaminophen (NORCO) 5-325 MG tablet Take 1 tablet by mouth every 6 (six) hours as needed for moderate pain. Patient not taking: Reported on 04/16/2016 01/05/16   Dene Gentry, MD  hydrOXYzine (VISTARIL) 50 MG capsule Take 1 capsule (50 mg total) by mouth every 8 (eight) hours as needed. Patient taking differently: Take 100 mg by mouth at bedtime as needed for anxiety (sleep).  04/21/15   Frazier Richards, MD  Levonorgestrel-Ethinyl Estradiol (AMETHIA) 0.15-0.03 &0.01 MG tablet Take 1 tablet by mouth daily. Patient not taking: Reported on 08/31/2016 08/24/14   Frazier Richards, MD  meloxicam (MOBIC) 15 MG tablet Take 1 tablet (15 mg total) by mouth daily. Patient not taking: Reported on 08/04/2016 03/31/16   Dene Gentry, MD  metFORMIN (GLUCOPHAGE-XR) 500 MG 24 hr tablet Take 500 mg by mouth daily. 01/21/16   Historical Provider, MD  methocarbamol (ROBAXIN) 500 MG tablet Take 1 tablet (500 mg total) by mouth every 8 (eight) hours as needed for muscle spasms. Patient not taking: Reported on 08/31/2016 12/30/15   Fransico Meadow, PA-C  mometasone (NASONEX) 50 MCG/ACT nasal spray Place 2 sprays into the nose daily. 02/27/14   Melony Overly, MD  montelukast (SINGULAIR) 10 MG tablet Take 1 tablet (10 mg total) by mouth at bedtime. Patient not taking: Reported on 08/31/2016 12/24/14   Frazier Richards, MD  Multiple Vitamins-Minerals (MULTIVITAMIN) tablet Take 1 tablet by mouth daily. Patient not taking: Reported on 04/16/2016 06/06/14   Olam Idler, MD  naproxen sodium (ANAPROX) 220 MG tablet Take 440 mg by mouth 2 (two) times daily with a meal.    Historical Provider, MD  omeprazole (PRILOSEC) 20 MG capsule Take 1 capsule  (20 mg total) by mouth daily as needed (for reflux). For 2 weeks, then as needed. Patient taking differently: Take 20 mg by mouth as directed. Take for 10 days in conjunction with pylera.. Take 20mg  omeprazole after morning meal and 20mg  after evening meal 07/05/15   Frazier Richards, MD  oxyCODONE-acetaminophen (PERCOCET/ROXICET) 5-325 MG tablet Take 1 tablet by mouth every 8 (eight) hours as needed for severe pain. Patient not taking: Reported on 08/04/2016 04/16/16   Shary Decamp, PA-C  polyethylene glycol powder (GLYCOLAX/MIRALAX) powder Take 17 g by mouth 2 (two) times daily as needed for moderate constipation. Take 1 capful in 8 ounces of water every 30 minutes for 8 doses Patient taking differently: Take 17 g by mouth daily as needed for moderate constipation. Take 1 capful in 8 ounces of water every 30 minutes for 8 doses 04/21/15   Frazier Richards, MD  ramelteon (ROZEREM) 8 MG tablet Take 1 tablet (8  mg total) by mouth at bedtime. Patient not taking: Reported on 12/31/2015 12/23/14   Frazier Richards, MD  sucralfate (CARAFATE) 1 GM/10ML suspension Take 10 mLs (1 g total) by mouth 4 (four) times daily -  with meals and at bedtime. 08/31/16   Francisco Mathews Rob[place], [place]    Family History Family History  Problem Relation Age of Onset  . Heart disease Mother   . Hypertension Mother   . Hyperlipidemia Mother   . Cancer Father     LUNG  . Mental illness Maternal Grandmother   . Cancer Maternal Grandmother     breast  . Depression Maternal Grandmother   . Bipolar disorder Maternal Aunt   . ADD / ADHD Cousin     Social History Social History  Substance Use Topics  . Smoking status: Never Smoker  . Smokeless tobacco: Never Used  . Alcohol use No     Allergies   Geodon [ziprasidone hcl]; Seroquel [quetiapine fumarate]; Carbamazepine; Quetiapine; and Ziprasidone mesylate   Review of Systems Review of Systems A complete 10 system review of systems was obtained and all systems are negative  except as noted in the HPI and PMH.    Physical Exam Updated Vital Signs BP 132/85 (BP Location: Left Arm)   Pulse 83   Temp 98 F (36.7 C) (Oral)   Resp 16   SpO2 100%   Physical Exam  Constitutional: She is oriented to person, place, and time. She appears well-developed and well-nourished.  HENT:  Head: Normocephalic and atraumatic.  Neck: Normal range of motion.  Cardiovascular: Normal rate.   Pulmonary/Chest: Effort normal.  Musculoskeletal: Normal range of motion.  Obvious swelling and bruising to the left lateral ankle and proximal foot.  Diffuse tenderness palpation, no significant laxity, sensation intact.  Remainder of upper extremity atraumatic nontender.  Neurological: She is alert and oriented to person, place, and time.  Skin: Skin is warm and dry.  Psychiatric: She has a normal mood and affect. Her behavior is normal.  Nursing note and vitals reviewed.    ED Treatments / Results  DIAGNOSTIC STUDIES: Oxygen Saturation is 100% on RA, normal by my interpretation.   COORDINATION OF CARE: 12:27 PM- Encouraged pt to continue with follow up with her PCP regarding the syncope. Will X-Ray left ankle and foot. Pt verbalizes understanding and agrees to plan.  Medications - No data to display  Labs (all labs ordered are listed, but only abnormal results are displayed) Labs Reviewed - No data to display  EKG  EKG Interpretation None       Radiology Dg Ankle Complete Left  Result Date: 11/22/2016 CLINICAL DATA:  Fall, lateral ankle swelling and pain, initial encounter. EXAM: LEFT ANKLE COMPLETE - 3+ VIEW COMPARISON:  None. FINDINGS: Soft tissue swelling is seen about the ankle joint. No acute osseous abnormality. IMPRESSION: Soft tissue swelling without fracture. Electronically Signed   By: Lorin Picket M.D.   On: 11/22/2016 12:50   Dg Foot Complete Left  Result Date: 11/22/2016 CLINICAL DATA:  Fall, lateral swelling with foot and ankle pain. Initial  encounter. EXAM: LEFT FOOT - COMPLETE 3+ VIEW COMPARISON:  None. FINDINGS: No acute osseous or joint abnormality. IMPRESSION: No acute osseous or joint abnormality. Electronically Signed   By: Lorin Picket M.D.   On: 11/22/2016 12:51    Procedures Procedures (including critical care time)  Medications Ordered in ED Medications - No data to display   Initial Impression / Assessment and Plan / ED Course  I have reviewed the triage vital signs and the nursing notes.  Pertinent labs & imaging results that were available during my care of the patient were reviewed by me and considered in my medical decision making (see chart for details).       I personally performed the services described in this documentation, which was scribed in my presence. The recorded information has been reviewed and is accurate.  Final Clinical Impressions(s) / ED Diagnoses   Final diagnoses:  Sprain of left ankle, unspecified ligament, initial encounter    41 year old female presents today with an ankle sprain.  She has no acute signs of fracture.  She was given detailed instructions for caring for this ankle.  She will follow-up with her primary care for reevaluation and ongoing management of other underlying conditions.  She is given strict return precautions, she verbalized understanding and agreement to today's plan had no further questions or concerns at the time discharge New Prescriptions New Prescriptions   No medications on file       Okey Regal, PA-C 11/22/16 [place], MD 11/22/16 [phone]

## 2016-11-22 NOTE — ED Notes (Signed)
PT DISCHARGED. INSTRUCTIONS GIVEN. AAOX4. PT IN NO APPARENT DISTRESS. THE OPPORTUNITY TO ASK QUESTIONS WAS PROVIDED. 

## 2016-11-22 NOTE — ED Triage Notes (Signed)
PT C/O LEFT ANKLE AND FOOT PAIN AND SWELLING AFTER A SYNCOPAL EPISODE TODAY. PT STS SHE FAINTED AFTER GETTING UP FROM THE COMMODE, WHEN SHE WOKE UP SHE HAD PAIN TO THE LEFT ANKLE, THEN SHE FAINTED AGAIN. PT STS SHE HAS THESE FAINTING EPISODES OFTEN. PT STS SHE CAN NOT BEAR WEIGHT ON THAT FOOT.

## 2016-11-22 NOTE — Discharge Instructions (Signed)
Please read attached information. If you experience any new or worsening signs or symptoms please return to the emergency room for evaluation. Please follow-up with your primary care provider or specialist as discussed.  °

## 2016-11-23 DIAGNOSIS — D75839 Thrombocytosis, unspecified: Secondary | ICD-10-CM | POA: Insufficient documentation

## 2016-11-23 DIAGNOSIS — D473 Essential (hemorrhagic) thrombocythemia: Secondary | ICD-10-CM | POA: Insufficient documentation

## 2016-12-11 ENCOUNTER — Other Ambulatory Visit: Payer: Self-pay | Admitting: Gastroenterology

## 2016-12-11 DIAGNOSIS — R7982 Elevated C-reactive protein (CRP): Secondary | ICD-10-CM

## 2016-12-11 DIAGNOSIS — D509 Iron deficiency anemia, unspecified: Secondary | ICD-10-CM

## 2016-12-11 DIAGNOSIS — R109 Unspecified abdominal pain: Secondary | ICD-10-CM

## 2016-12-13 ENCOUNTER — Ambulatory Visit
Admission: RE | Admit: 2016-12-13 | Discharge: 2016-12-13 | Disposition: A | Discharge status: Home / Self Care | Payer: Medicare Other | Source: Ambulatory Visit | Attending: Gastroenterology | Admitting: Gastroenterology

## 2016-12-13 DIAGNOSIS — D509 Iron deficiency anemia, unspecified: Secondary | ICD-10-CM

## 2016-12-13 DIAGNOSIS — R109 Unspecified abdominal pain: Secondary | ICD-10-CM

## 2016-12-13 DIAGNOSIS — R7982 Elevated C-reactive protein (CRP): Secondary | ICD-10-CM

## 2016-12-13 MED ORDER — IOPAMIDOL (ISOVUE-300) INJECTION 61%
125.0000 mL | Freq: Once | INTRAVENOUS | Status: AC | PRN
  Administered 2016-12-13: 125 mL via INTRAVENOUS

## 2017-01-30 DIAGNOSIS — D509 Iron deficiency anemia, unspecified: Secondary | ICD-10-CM | POA: Insufficient documentation

## 2017-02-05 ENCOUNTER — Ambulatory Visit: Payer: Self-pay | Admitting: Family Medicine

## 2017-02-06 ENCOUNTER — Ambulatory Visit: Payer: Self-pay | Admitting: Family Medicine

## 2017-02-28 ENCOUNTER — Encounter: Payer: Medicare Other | Admitting: Family Medicine

## 2017-03-05 ENCOUNTER — Emergency Department (HOSPITAL_COMMUNITY)
Admission: EM | Admit: 2017-03-05 | Discharge: 2017-03-05 | Discharge status: Absent without leave | Payer: Medicare Other

## 2017-03-08 DIAGNOSIS — D72820 Lymphocytosis (symptomatic): Secondary | ICD-10-CM | POA: Insufficient documentation

## 2017-05-01 ENCOUNTER — Ambulatory Visit
Admission: RE | Admit: 2017-05-01 | Discharge: 2017-05-01 | Disposition: A | Discharge status: Home / Self Care | Payer: Medicare Other | Source: Ambulatory Visit | Attending: Gastroenterology | Admitting: Gastroenterology

## 2017-05-01 ENCOUNTER — Other Ambulatory Visit: Payer: Self-pay | Admitting: Gastroenterology

## 2017-05-01 DIAGNOSIS — R109 Unspecified abdominal pain: Secondary | ICD-10-CM

## 2017-05-01 DIAGNOSIS — R140 Abdominal distension (gaseous): Secondary | ICD-10-CM

## 2017-05-23 ENCOUNTER — Ambulatory Visit (INDEPENDENT_AMBULATORY_CARE_PROVIDER_SITE_OTHER): Payer: Medicare Other | Admitting: Family Medicine

## 2017-05-23 ENCOUNTER — Encounter: Payer: Self-pay | Admitting: Family Medicine

## 2017-05-23 VITALS — BP 130/89 | HR 108 | Temp 98.0°F | Resp 16 | Ht 64.17 in | Wt 208.0 lb

## 2017-05-23 DIAGNOSIS — N920 Excessive and frequent menstruation with regular cycle: Secondary | ICD-10-CM | POA: Diagnosis not present

## 2017-05-23 DIAGNOSIS — E010 Iodine-deficiency related diffuse (endemic) goiter: Secondary | ICD-10-CM

## 2017-05-23 DIAGNOSIS — Z23 Encounter for immunization: Secondary | ICD-10-CM | POA: Diagnosis not present

## 2017-05-23 DIAGNOSIS — L218 Other seborrheic dermatitis: Secondary | ICD-10-CM

## 2017-05-23 DIAGNOSIS — L210 Seborrhea capitis: Secondary | ICD-10-CM

## 2017-05-23 DIAGNOSIS — R300 Dysuria: Secondary | ICD-10-CM

## 2017-05-23 DIAGNOSIS — E119 Type 2 diabetes mellitus without complications: Secondary | ICD-10-CM

## 2017-05-23 DIAGNOSIS — F3170 Bipolar disorder, currently in remission, most recent episode unspecified: Secondary | ICD-10-CM

## 2017-05-23 MED ORDER — FLUOCINOLONE ACETONIDE 0.01 % OT OIL: 1.0000 "application " | TOPICAL_OIL | Freq: Every day | OTIC | 2 refills | Status: DC | PRN

## 2017-05-23 NOTE — Patient Instructions (Signed)
     IF you received an x-ray today, you will receive an invoice from [facility]. Please contact [facility] at [phone] with questions or concerns regarding your invoice.   IF you received labwork today, you will receive an invoice from [facility]. Please contact [facility] at [phone] with questions or concerns regarding your invoice.   Our billing staff will not be able to assist you with questions regarding bills from these companies.  You will be contacted with the lab results as soon as they are available. The fastest way to get your results is to activate your My Chart account. Instructions are located on the last page of this paperwork. If you have not heard from us regarding the results in 2 weeks, please contact this office.     

## 2017-05-23 NOTE — Progress Notes (Addendum)
Subjective:    Patient ID: Laura Horton, female    DOB: [birth year], 41 y.o.   MRN: [id number]  05/23/2017  Establish Care; Diabetes; Depression; Anemia; Seborrheic Dermatitis; and Urinary Tract Infection   HPI This 41 y.o. female presents to establish care.    Last physical:  12/26/2016 Pap smear:  2017 Mammogram:  2017 Colonoscopy:  07/2016 Eye exam:  Every year; 2017; due this month for diabetic exam Dental exam:  Scheduling oral surgery.  PCP: [facility]  DMII: diagnosed 2017; improved with diet and exercise.  No longer on medicaiton; HgbA1C has greatly improved. Has lost sixty pounds.     Depression and anxiety/bipolar: disability; followed by Dr. Lajoyce Corners; followed every six months.   Lymphocytosis/anemia iron deficiency: followed by hematology; s/p iron infusion.  Ongoing menses.  Hematology in October 2018.    Menorrhagia: very heavy menses; was passing out.  Bleeds seven days; now bleeding five days per menses.  IUD removed in 2016.  No menses with IUD.  Planned on getting IUD placed.  Insurance did not cover IUD.    Has RIGHT knee pain and lower back pain:  Suffers with R knee pain and lower back pain.  Requested ortho consutlation.    R heel pain: intermittent issue.  Stomach issues: s/p EGD and colonoscopy; followed by gastroenterology every 3 months.  No carbs.  No fried foods.  H. Pylori +; treated.    UTI: recently treated.    Seborrhea dermatitis: had horrible scalp irritation; scalp is burning.  Had to wash hair to remove the oil.  Needs refill once per day.    BP Readings from Last 3 Encounters:  05/30/17 118/70  05/23/17 130/89  11/22/16 132/85   Wt Readings from Last 3 Encounters:  05/30/17 215 lb (97.5 kg)  05/23/17 208 lb (94.3 kg)  11/22/16 192 lb (87.1 kg)   Immunization History  Administered Date(s) Administered  . Influenza Whole 09/08/2009  . Influenza,inj,Quad PF,6+ Mos 05/23/2017    Review of Systems  Constitutional:  Negative for chills, diaphoresis, fatigue and fever.  Eyes: Negative for visual disturbance.  Respiratory: Negative for cough and shortness of breath.   Cardiovascular: Negative for chest pain, palpitations and leg swelling.  Gastrointestinal: Negative for abdominal pain, constipation, diarrhea, nausea and vomiting.  Endocrine: Negative for cold intolerance, heat intolerance, polydipsia, polyphagia and polyuria.  Genitourinary: Positive for frequency, menstrual problem and urgency. Negative for decreased urine volume, dysuria, flank pain, genital sores, hematuria, pelvic pain, vaginal bleeding and vaginal discharge.  Musculoskeletal: Positive for arthralgias, back pain and gait problem.  Skin: Positive for rash. Negative for color change and wound.  Neurological: Negative for dizziness, tremors, seizures, syncope, facial asymmetry, speech difficulty, weakness, light-headedness, numbness and headaches.  Psychiatric/Behavioral: Positive for dysphoric mood. The patient is nervous/anxious.     Past Medical History:  Diagnosis Date  . ADHD (attention deficit hyperactivity disorder)   . Anxiety   . Bipolar 1 disorder ([place])   . Chronic back pain   . Depression   . Diabetes mellitus without complication ([place])   . Thrombocytosis ([place])    Past Surgical History:  Procedure Laterality Date  . CHOLECYSTECTOMY     Allergies  Allergen Reactions  . Geodon [Ziprasidone Hcl] Anaphylaxis  . Seroquel [Quetiapine Fumarate] Anaphylaxis  . Carbamazepine Nausea And Vomiting and Other (See Comments)    migraine  . Quetiapine     REACTION: Weight Gain  . Ziprasidone Mesylate     REACTION: Dyspnea, swollen tongue, neck pain,  torticullis   Current Outpatient Prescriptions  Medication Sig Dispense Refill  . cetirizine (ZYRTEC) 10 MG tablet Take 1 tablet (10 mg total) by mouth daily. 30 tablet 11  . clonazePAM (KLONOPIN) 2 MG tablet Take 2 mg by mouth 3 (three) times daily.     . cloNIDine (CATAPRES) 0.2  MG tablet Take 1 tablet (0.2 mg total) by mouth 4 (four) times daily. (Patient taking differently: Take 0.6 mg by mouth at bedtime. ) 120 tablet 11  . meloxicam (MOBIC) 15 MG tablet Take 1 tablet (15 mg total) by mouth daily. 30 tablet 1  . montelukast (SINGULAIR) 10 MG tablet Take 1 tablet (10 mg total) by mouth at bedtime. 30 tablet 3  . omeprazole (PRILOSEC) 20 MG capsule Take 1 capsule (20 mg total) by mouth daily as needed (for reflux). For 2 weeks, then as needed. (Patient taking differently: Take 20 mg by mouth as directed. Take for 10 days in conjunction with pylera.. Take 20mg  omeprazole after morning meal and 20mg  after evening meal) 90 capsule 5  . Fluocinolone Acetonide 0.01 % OIL Place 1 application in ear(s) daily as needed. 20 mL 2  . Fluocinolone Acetonide Scalp 0.01 % OIL Apply 20 mLs topically daily as needed. 20 mL 2   No current facility-administered medications for this visit.    Social History   Social History  . Marital status: Single    Spouse name: N/A  . Number of children: 1  . Years of education: N/A   Occupational History  . disability     secondary to bipolar disorder   Social History Main Topics  . Smoking status: Never Smoker  . Smokeless tobacco: Never Used  . Alcohol use No  . Drug use: No  . Sexual activity: Not Currently    Birth control/ protection: IUD   Other Topics Concern  . Not on file   Social History Narrative   Marital status: single; not dating      Children: 1 son (29 yo)      Lives: with mother, son      Employment: disability for bipolar disorder since 2013.      Tobacco: never      Alcohol: none      Drugs: none      Exercise: none in 2018; previous exercise.   Family History  Problem Relation Age of Onset  . Heart disease Mother        cardiomegaly  . Hypertension Mother   . Hyperlipidemia Mother   . Hypertension Father   . Mental illness Maternal Grandmother   . Cancer Maternal Grandmother        breast  .  Depression Maternal Grandmother   . Bipolar disorder Maternal Aunt   . ADD / ADHD Cousin   . Hypertension Sister        Objective:    BP 130/89   Pulse (!) 108   Temp 98 F (36.7 C) (Oral)   Resp 16   Ht 5' 4.17" (1.63 m)   Wt 208 lb (94.3 kg)   LMP 05/11/2017 (Approximate)   SpO2 98%   BMI 35.51 kg/m  Physical Exam  Constitutional: She is oriented to person, place, and time. She appears well-developed and well-nourished. No distress.  HENT:  Head: Normocephalic and atraumatic.  Right Ear: External ear normal.  Left Ear: External ear normal.  Nose: Nose normal.  Mouth/Throat: Oropharynx is clear and moist.  Eyes: Pupils are equal, round, and reactive to light. Conjunctivae  and EOM are normal.  Neck: Normal range of motion. Neck supple. Carotid bruit is not present. Thyromegaly present.  Cardiovascular: Normal rate, regular rhythm, normal heart sounds and intact distal pulses.  Exam reveals no gallop and no friction rub.   No murmur heard. Pulmonary/Chest: Effort normal and breath sounds normal. She has no wheezes. She has no rales.  Abdominal: Soft. Bowel sounds are normal. She exhibits no distension and no mass. There is no tenderness. There is no rebound and no guarding.  Lymphadenopathy:    She has no cervical adenopathy.  Neurological: She is alert and oriented to person, place, and time. No cranial nerve deficit. She exhibits normal muscle tone. Coordination normal.  Skin: Skin is warm and dry. No rash noted. She is not diaphoretic. No erythema. No pallor.  Psychiatric: She has a normal mood and affect. Her behavior is normal. Judgment and thought content normal.    No results found. Depression screen [facility] 2/9 05/30/2017 05/23/2017 09/07/2015 12/23/2014 08/24/2014  Decreased Interest 0 3 0 0 1  Down, Depressed, Hopeless 0 3 1 1 1   PHQ - 2 Score 0 6 1 1 2   Altered sleeping - 3 - - -  Tired, decreased energy - 3 - - -  Change in appetite - 3 - - -  Feeling bad or failure  about yourself  - 3 - - -  Trouble concentrating - 1 - - -  Moving slowly or fidgety/restless - 0 - - -  Suicidal thoughts - 0 - - -  PHQ-9 Score - 19 - - -   Fall Risk  05/30/2017 05/23/2017  Falls in the past year? No Yes  Number falls in past yr: - 2 or more  Injury with Fall? - No        Assessment & Plan:   1. 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 Dysuria   3. Thyromegaly   4. Need for prophylactic vaccination and inoculation against influenza   5. Controlled type 2 diabetes mellitus without complication, without long-term current use of insulin ([place])   6. Bipolar disorder in partial remission, most recent episode unspecified type ([place])   7. Menorrhagia with regular cycle    -uncontrolled seborrhea dermatitis warranting large amounts of topical steroid; advised to use topical steroid sparingly; refer to dermatology; refill provided. -thyromegaly on exam; obtain thyroid US. -recent UTI with ongoing symptoms; obtain repeat labs. -working very hard on exercise, weight loss, and exercise for diabetes management.  Orders Placed This Encounter  Procedures  . Urine Culture  . US THYROID    Epic order [id number]/no needs Ins-[facility]    Standing Status:   Future    Standing Expiration Date:   07/23/2018    Order Specific Question:   Reason for Exam (SYMPTOM  OR DIAGNOSIS REQUIRED)    Answer:   thyromegaly on exam    Order Specific Question:   Preferred imaging location?    Answer:   [facility]  . Flu Vaccine QUAD 36+ mos IM  . Urinalysis, Routine w reflex microscopic  . Ambulatory referral to Dermatology    Referral Priority:   Routine    Referral Type:   Consultation    Referral Reason:   Specialty Services Required    Requested Specialty:   Dermatology    Number of Visits Requested:   1   Meds ordered this encounter  Medications  . DISCONTD: Fluocinolone Acetonide 0.01 % OIL    Sig: Place in ear(s).  Marland Kitchen  DISCONTD: Fluocinolone Acetonide 0.01 % OIL    Sig:  Place 1 application in ear(s) daily as needed.    Dispense:  20 mL    Refill:  2  . DISCONTD: Fluocinolone Acetonide 0.01 % OIL    Sig: Place 1 application in ear(s) daily as needed.    Dispense:  20 mL    Refill:  2    Return in about 4 weeks (around 06/20/2017) for recheck diabetes.   Liborio Saccente Elayne Guerin, M.D. Primary Care at [facility] previously [facility] [address] [place], [place]  [address] [phone] phone [phone] fax

## 2017-05-24 ENCOUNTER — Other Ambulatory Visit: Payer: Self-pay

## 2017-05-24 ENCOUNTER — Telehealth: Payer: Self-pay | Admitting: Family Medicine

## 2017-05-24 LAB — URINALYSIS, ROUTINE W REFLEX MICROSCOPIC
Bilirubin, UA: NEGATIVE
GLUCOSE, UA: NEGATIVE
LEUKOCYTES UA: NEGATIVE
Nitrite, UA: NEGATIVE
PROTEIN UA: NEGATIVE
RBC, UA: NEGATIVE
SPEC GRAV UA: 1.022 (ref 1.005–1.030)
Urobilinogen, Ur: 0.2 mg/dL (ref 0.2–1.0)
pH, UA: 5 (ref 5.0–7.5)

## 2017-05-24 MED ORDER — FLUOCINOLONE ACETONIDE 0.01 % OT OIL: 1.0000 "application " | TOPICAL_OIL | Freq: Every day | OTIC | 2 refills | Status: DC | PRN

## 2017-05-24 MED ORDER — FLUOCINOLONE ACETONIDE SCALP 0.01 % EX OIL: 20.0000 mL | TOPICAL_OIL | Freq: Every day | CUTANEOUS | 2 refills | Status: DC | PRN

## 2017-05-24 NOTE — Telephone Encounter (Signed)
Called pt and informed her that I did resend the correct medication to pharmacy.

## 2017-05-24 NOTE — Telephone Encounter (Signed)
Pt is calling stating that her prescription is incorrect.  Pt states that it is supposed to be the oil for the scalp, not the ears.  Pt states that the [facility] pharmacy is the best pharmacy to send that to as well.

## 2017-05-25 LAB — URINE CULTURE

## 2017-05-28 ENCOUNTER — Telehealth: Payer: Self-pay | Admitting: Family Medicine

## 2017-05-28 NOTE — Telephone Encounter (Signed)
Pt is looking for lab results  Best number [phone]

## 2017-05-29 NOTE — Telephone Encounter (Signed)
Patient called back and was given lab results. Has apt with Dr. Tamala Julian today at 5p.

## 2017-05-29 NOTE — Telephone Encounter (Signed)
LVM to call back for lab results.

## 2017-05-30 ENCOUNTER — Encounter: Payer: Self-pay | Admitting: Family Medicine

## 2017-05-30 ENCOUNTER — Ambulatory Visit (INDEPENDENT_AMBULATORY_CARE_PROVIDER_SITE_OTHER): Payer: Medicare Other | Admitting: Family Medicine

## 2017-05-30 VITALS — BP 118/70 | HR 89 | Temp 98.8°F | Wt 215.0 lb

## 2017-05-30 DIAGNOSIS — B373 Candidiasis of vulva and vagina: Secondary | ICD-10-CM | POA: Diagnosis not present

## 2017-05-30 DIAGNOSIS — L298 Other pruritus: Secondary | ICD-10-CM | POA: Diagnosis not present

## 2017-05-30 DIAGNOSIS — B3731 Acute candidiasis of vulva and vagina: Secondary | ICD-10-CM

## 2017-05-30 DIAGNOSIS — N644 Mastodynia: Secondary | ICD-10-CM | POA: Diagnosis not present

## 2017-05-30 DIAGNOSIS — Z803 Family history of malignant neoplasm of breast: Secondary | ICD-10-CM

## 2017-05-30 DIAGNOSIS — N898 Other specified noninflammatory disorders of vagina: Secondary | ICD-10-CM

## 2017-05-30 LAB — POCT WET + KOH PREP: Trich by wet prep: ABSENT

## 2017-05-30 MED ORDER — FLUCONAZOLE 150 MG PO TABS: 150.0000 mg | ORAL_TABLET | Freq: Once | ORAL | 0 refills | Status: AC

## 2017-05-30 NOTE — Patient Instructions (Addendum)
IF you received an x-ray today, you will receive an invoice from [facility]. Please contact [facility] at [phone] with questions or concerns regarding your invoice.   IF you received labwork today, you will receive an invoice from [facility]. Please contact [facility] at [phone] with questions or concerns regarding your invoice.   Our billing staff will not be able to assist you with questions regarding bills from these companies.  You will be contacted with the lab results as soon as they are available. The fastest way to get your results is to activate your My Chart account. Instructions are located on the last page of this paperwork. If you have not heard from [place] regarding the results in 2 weeks, please contact this office.      Vaginal Yeast infection, Adult Vaginal yeast infection is a condition that causes soreness, swelling, and redness (inflammation) of the vagina. It also causes vaginal discharge. This is a common condition. Some women get this infection frequently. What are the causes? This condition is caused by a change in the normal balance of the yeast (candida) and bacteria that live in the vagina. This change causes an overgrowth of yeast, which causes the inflammation. What increases the risk? This condition is more likely to develop in:  Women who take antibiotic medicines.  Women who have diabetes.  Women who take birth control pills.  Women who are pregnant.  Women who douche often.  Women who have a weak defense (immune) system.  Women who have been taking steroid medicines for a long time.  Women who frequently wear tight clothing.  What are the signs or symptoms? Symptoms of this condition include:  White, thick vaginal discharge.  Swelling, itching, redness, and irritation of the vagina. The lips of the vagina (vulva) may be affected as well.  Pain or a burning feeling while urinating.  Pain during sex.  How is  this diagnosed? This condition is diagnosed with a medical history and physical exam. This will include a pelvic exam. Your health care provider will examine a sample of your vaginal discharge under a microscope. Your health care provider may send this sample for testing to confirm the diagnosis. How is this treated? This condition is treated with medicine. Medicines may be over-the-counter or prescription. You may be told to use one or more of the following:  Medicine that is taken orally.  Medicine that is applied as a cream.  Medicine that is inserted directly into the vagina (suppository).  Follow these instructions at home:  Take or apply over-the-counter and prescription medicines only as told by your health care provider.  Do not have sex until your health care provider has approved. Tell your sex partner that you have a yeast infection. That person should go to his or her health care provider if he or she develops symptoms.  Do not wear tight clothes, such as pantyhose or tight pants.  Avoid using tampons until your health care provider approves.  Eat more yogurt. This may help to keep your yeast infection from returning.  Try taking a sitz bath to help with discomfort. This is a warm water bath that is taken while you are sitting down. The water should only come up to your hips and should cover your buttocks. Do this 3-4 times per day or as told by your health care provider.  Do not douche.  Wear breathable, cotton underwear.  If you have diabetes, keep your blood sugar levels under control.  Contact a health care provider if:  You have a fever.  Your symptoms go away and then return.  Your symptoms do not get better with treatment.  Your symptoms get worse.  You have new symptoms.  You develop blisters in or around your vagina.  You have blood coming from your vagina and it is not your menstrual period.  You develop pain in your abdomen. This information is not  intended to replace advice given to you by your health care provider. Make sure you discuss any questions you have with your health care provider. Document Released: 05/31/2005 Document Revised: 02/02/2016 Document Reviewed: 02/22/2015 Elsevier Interactive Patient Education  2018 [employer].

## 2017-05-30 NOTE — Progress Notes (Signed)
Subjective:    Patient ID: Laura Horton, female    DOB: [birth year], 41 y.o.   MRN: [id number]  05/30/2017  Vaginal Discharge (have been on some antibotics and she think they have gave her a yeast infection. ) and Cyst (up under her left arm x 1 month that is giving her some pain. )   HPI This 41 y.o. female presents for evaluation of L axillary knot for past month and also yeast infection.  Stress eating.  Worried about L axillary knot. Breast cancer in family; cousin in forties with breast cancer.  Has been on abx recently for UTI; thinks has yeast infection.  Gaining weight.  Excessive worry over breast cancer.  Has not been sexually active in years.  Usually does not have breast tenderness with menses or with hormonal fluctuations through the month.   BP Readings from Last 3 Encounters:  05/30/17 118/70  05/23/17 130/89  11/22/16 132/85   Wt Readings from Last 3 Encounters:  05/30/17 215 lb (97.5 kg)  05/23/17 208 lb (94.3 kg)  11/22/16 192 lb (87.1 kg)   Immunization History  Administered Date(s) Administered  . Influenza Whole 09/08/2009  . Influenza,inj,Quad PF,6+ Mos 05/23/2017    Review of Systems  Constitutional: Negative for chills, diaphoresis, fatigue and fever.  Eyes: Negative for visual disturbance.  Respiratory: Negative for cough and shortness of breath.   Cardiovascular: Negative for chest pain, palpitations and leg swelling.  Gastrointestinal: Negative for abdominal pain, constipation, diarrhea, nausea and vomiting.  Endocrine: Negative for cold intolerance, heat intolerance, polydipsia, polyphagia and polyuria.  Genitourinary: Positive for vaginal discharge. Negative for decreased urine volume, dysuria, enuresis, flank pain, frequency, genital sores, hematuria, menstrual problem, pelvic pain, urgency, vaginal bleeding and vaginal pain.  Neurological: Negative for dizziness, tremors, seizures, syncope, facial asymmetry, speech difficulty, weakness,  light-headedness, numbness and headaches.    Past Medical History:  Diagnosis Date  . ADHD (attention deficit hyperactivity disorder)   . Anxiety   . Bipolar 1 disorder ([place])   . Chronic back pain   . Depression   . Diabetes mellitus without complication ([place])   . Thrombocytosis ([place])    Past Surgical History:  Procedure Laterality Date  . CHOLECYSTECTOMY     Allergies  Allergen Reactions  . Geodon [Ziprasidone Hcl] Anaphylaxis  . Seroquel [Quetiapine Fumarate] Anaphylaxis  . Carbamazepine Nausea And Vomiting and Other (See Comments)    migraine  . Quetiapine     REACTION: Weight Gain  . Ziprasidone Mesylate     REACTION: Dyspnea, swollen tongue, neck pain, torticullis    Social History   Social History  . Marital status: Single    Spouse name: N/A  . Number of children: 1  . Years of education: N/A   Occupational History  . disability     secondary to bipolar disorder   Social History Main Topics  . Smoking status: Never Smoker  . Smokeless tobacco: Never Used  . Alcohol use No  . Drug use: No  . Sexual activity: Not Currently    Birth control/ protection: IUD   Other Topics Concern  . Not on file   Social History Narrative   Marital status: single; not dating      Children: 1 son (86 yo)      Lives: with mother, son      Employment: disability for bipolar disorder since 2013.      Tobacco: never      Alcohol: none  Drugs: none      Exercise: none in 2018; previous exercise.   Family History  Problem Relation Age of Onset  . Heart disease Mother        cardiomegaly  . Hypertension Mother   . Hyperlipidemia Mother   . Hypertension Father   . Mental illness Maternal Grandmother   . Cancer Maternal Grandmother        breast  . Depression Maternal Grandmother   . Bipolar disorder Maternal Aunt   . ADD / ADHD Cousin   . Hypertension Sister        Objective:    BP 118/70   Pulse 89   Temp 98.8 F (37.1 C) (Oral)   Wt 215 lb (97.5 kg)    LMP 05/11/2017 (Approximate)   BMI 36.71 kg/m  Physical Exam  Constitutional: She is oriented to person, place, and time. She appears well-developed and well-nourished. No distress.  HENT:  Head: Normocephalic and atraumatic.  Eyes: Pupils are equal, round, and reactive to light. Conjunctivae and EOM are normal.  Neck: Normal range of motion. Neck supple. Carotid bruit is not present.  Cardiovascular: Normal rate, regular rhythm, normal heart sounds and intact distal pulses.  Exam reveals no gallop and no friction rub.   No murmur heard. Pulmonary/Chest: Effort normal and breath sounds normal. She has no wheezes. She has no rales. Right breast exhibits no inverted nipple, no mass, no nipple discharge, no skin change and no tenderness. Left breast exhibits tenderness. Left breast exhibits no inverted nipple, no mass, no nipple discharge and no skin change. Breasts are symmetrical.    +TTP at area marked at L axillary region.  Abdominal: Soft. Bowel sounds are normal. She exhibits no distension and no mass. There is no tenderness. There is no rebound and no guarding.  Genitourinary: Uterus normal. No labial fusion. There is no rash, tenderness, lesion or injury on the right labia. There is no rash, tenderness, lesion or injury on the left labia. Cervix exhibits no motion tenderness, no discharge and no friability. Right adnexum displays no mass, no tenderness and no fullness. Left adnexum displays no mass, no tenderness and no fullness. No erythema, tenderness or bleeding in the vagina. No foreign body in the vagina. No signs of injury around the vagina. Vaginal discharge found.  Neurological: She is alert and oriented to person, place, and time. No cranial nerve deficit.  Skin: Skin is warm and dry. No rash noted. She is not diaphoretic. No erythema. No pallor.  Psychiatric: She has a normal mood and affect. Her behavior is normal.    No results found. Depression screen [facility] 2/9 05/30/2017  05/23/2017 09/07/2015 12/23/2014 08/24/2014  Decreased Interest 0 3 0 0 1  Down, Depressed, Hopeless 0 3 1 1 1   PHQ - 2 Score 0 6 1 1 2   Altered sleeping - 3 - - -  Tired, decreased energy - 3 - - -  Change in appetite - 3 - - -  Feeling bad or failure about yourself  - 3 - - -  Trouble concentrating - 1 - - -  Moving slowly or fidgety/restless - 0 - - -  Suicidal thoughts - 0 - - -  PHQ-9 Score - 19 - - -   Fall Risk  05/30/2017 05/23/2017  Falls in the past year? No Yes  Number falls in past yr: - 2 or more  Injury with Fall? - No        Assessment & Plan:  1. Vaginal itching   2. Breast pain, left   3. Family history of breast cancer   4. Candidiasis of vagina    -new onset candidiasis vulvovaginal; treat with Diflucan. -new onset L breast/axillary tenderness; family hx of breast cancer; refer to diagnostic mammogram and L breast ultrasound.  Orders Placed This Encounter  Procedures  . MM DIAG BREAST TOMO UNI LEFT    Epic order Pf 09/11/2016 bcg/no needs [facility] **Cosigned**    Standing Status:   Future    Standing Expiration Date:   07/30/2018    Order Specific Question:   Reason for Exam (SYMPTOM  OR DIAGNOSIS REQUIRED)    Answer:   L axillary tenderness    Order Specific Question:   Is the patient pregnant?    Answer:   No    Order Specific Question:   Preferred imaging location?    Answer:   [facility]  . US BREAST LTD UNI LEFT INC AXILLA    Epic order Pf 09/11/2016 bcg/no needs [facility] 05/31/17 requested cosign//[facility]    Standing Status:   Future    Standing Expiration Date:   07/31/2018    Order Specific Question:   Reason for Exam (SYMPTOM  OR DIAGNOSIS REQUIRED)    Answer:   L breast tenderness axillary    Order Specific Question:   Preferred imaging location?    Answer:   [facility]  . POCT Wet + KOH Prep   Meds ordered this encounter  Medications  . fluconazole (DIFLUCAN) 150 MG tablet    Sig: Take 1 tablet (150 mg  total) by mouth once. Repeat if needed    Dispense:  2 tablet    Refill:  0    No Follow-up on file.   Castin Donaghue Elayne Guerin, M.D. [facility] [address] [place], [place]  [address] [phone] phone [phone] fax

## 2017-05-31 ENCOUNTER — Encounter: Payer: Self-pay | Admitting: Family Medicine

## 2017-06-04 ENCOUNTER — Ambulatory Visit: Admission: RE | Admit: 2017-06-04 | Payer: Self-pay | Source: Ambulatory Visit

## 2017-06-04 ENCOUNTER — Ambulatory Visit
Admission: RE | Admit: 2017-06-04 | Discharge: 2017-06-04 | Disposition: A | Discharge status: Home / Self Care | Payer: Medicare Other | Source: Ambulatory Visit | Attending: Family Medicine | Admitting: Family Medicine

## 2017-06-04 ENCOUNTER — Other Ambulatory Visit: Payer: Self-pay | Admitting: Family Medicine

## 2017-06-04 DIAGNOSIS — N644 Mastodynia: Secondary | ICD-10-CM

## 2017-06-05 ENCOUNTER — Encounter: Payer: Self-pay | Admitting: Family Medicine

## 2017-06-05 ENCOUNTER — Ambulatory Visit
Admission: RE | Admit: 2017-06-05 | Discharge: 2017-06-05 | Disposition: A | Discharge status: Home / Self Care | Payer: Medicare Other | Source: Ambulatory Visit | Attending: Family Medicine | Admitting: Family Medicine

## 2017-06-05 DIAGNOSIS — E010 Iodine-deficiency related diffuse (endemic) goiter: Secondary | ICD-10-CM

## 2017-06-11 ENCOUNTER — Encounter: Payer: Self-pay | Admitting: Family Medicine

## 2017-06-12 ENCOUNTER — Telehealth: Payer: Self-pay

## 2017-06-12 NOTE — Telephone Encounter (Signed)
Pt calling for [place] results. Pt received results from radiologist and is very concerned. Please advise.

## 2017-06-15 ENCOUNTER — Other Ambulatory Visit: Payer: Self-pay | Admitting: Family Medicine

## 2017-06-15 ENCOUNTER — Encounter: Payer: Self-pay | Admitting: Family Medicine

## 2017-06-15 DIAGNOSIS — E041 Nontoxic single thyroid nodule: Secondary | ICD-10-CM

## 2017-06-15 NOTE — Telephone Encounter (Signed)
Thyroid US results sent via MyChart and to radiology pool.  Thyroid US bx ordered.

## 2017-06-16 ENCOUNTER — Ambulatory Visit: Payer: Medicare Other | Admitting: Family Medicine

## 2017-06-16 IMAGING — CR DG FOOT COMPLETE 3+V*L*
3 series · 3 of 3 positions shown · non-contrast
Comparison: None.

CLINICAL DATA: Fall, lateral swelling with foot and ankle pain.
Initial encounter.

EXAM:
LEFT FOOT - COMPLETE 3+ VIEW

[x foot ap left]
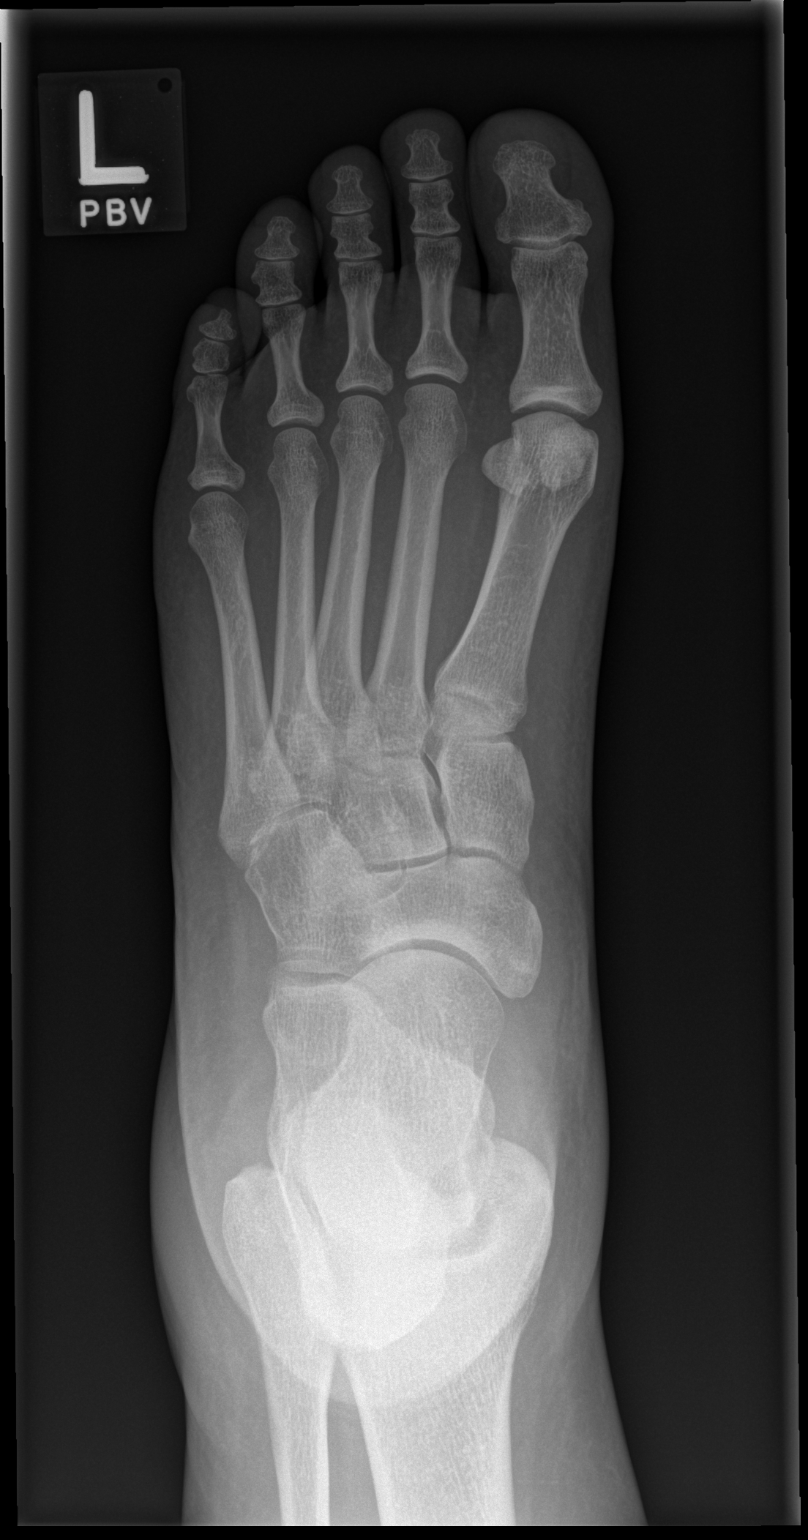

[x foot obl left]
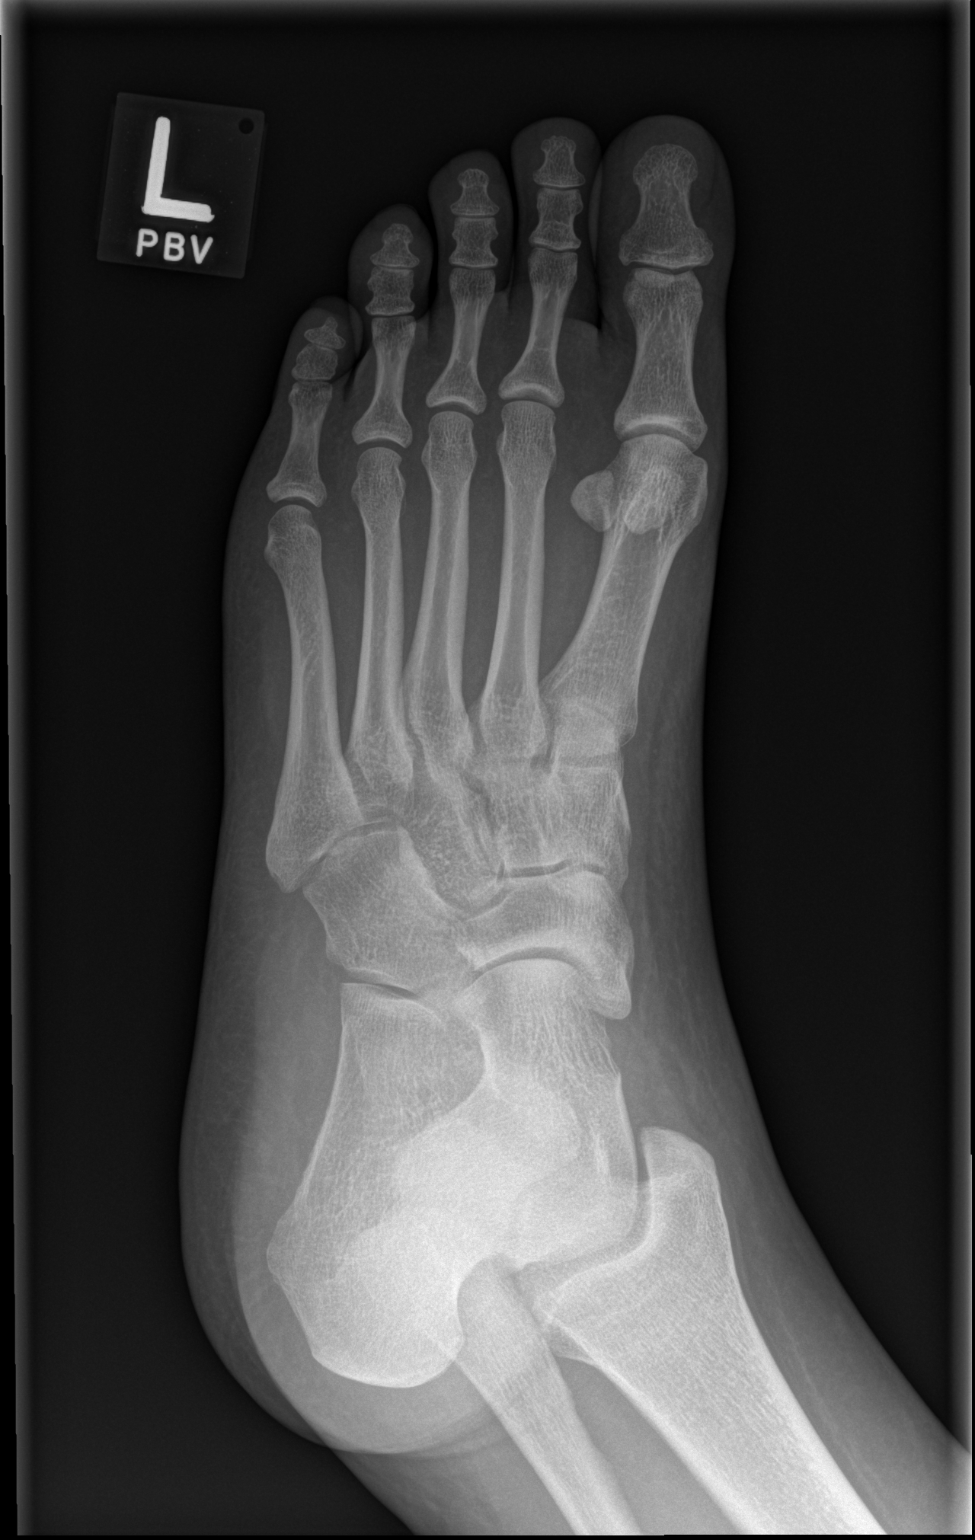

[x foot lat left]
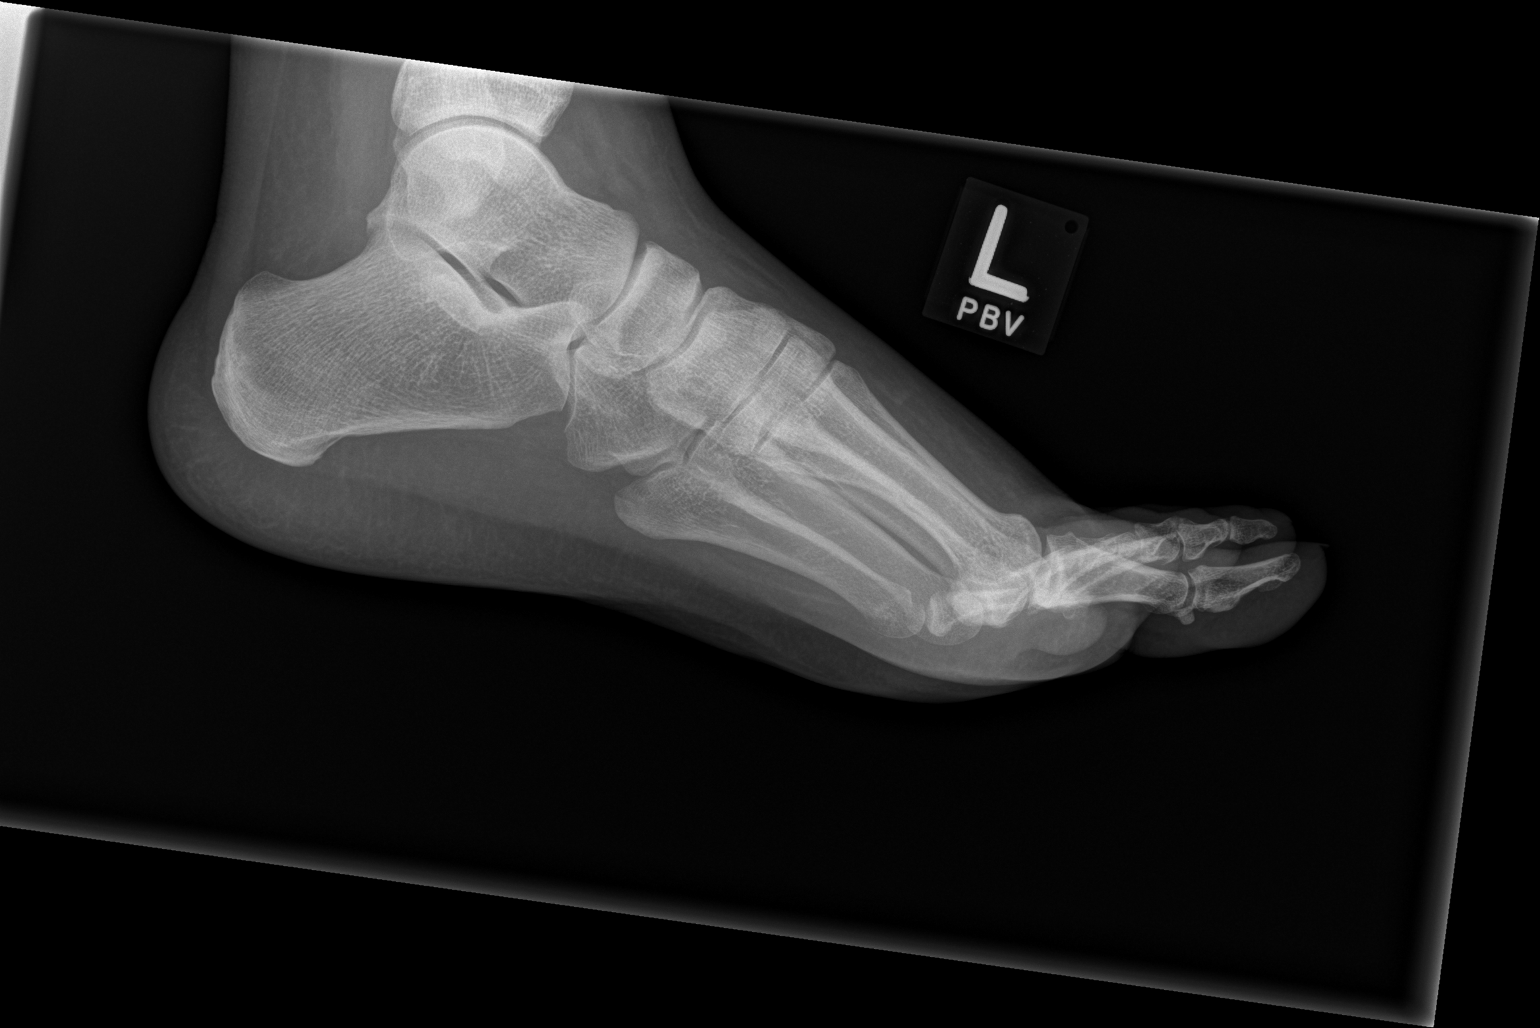

[3 of 3 positions shown; findings below may reference images not displayed]

FINDINGS: No acute osseous or joint abnormality.
IMPRESSION: No acute osseous or joint abnormality.

## 2017-06-19 ENCOUNTER — Encounter (HOSPITAL_COMMUNITY): Payer: Self-pay

## 2017-06-19 ENCOUNTER — Emergency Department (HOSPITAL_COMMUNITY)
Admission: EM | Admit: 2017-06-19 | Discharge: 2017-06-19 | Disposition: A | Discharge status: Home / Self Care | Payer: Medicare Other | Attending: Emergency Medicine | Admitting: Emergency Medicine

## 2017-06-19 ENCOUNTER — Emergency Department (HOSPITAL_COMMUNITY): Payer: Medicare Other

## 2017-06-19 DIAGNOSIS — K5904 Chronic idiopathic constipation: Secondary | ICD-10-CM

## 2017-06-19 DIAGNOSIS — E119 Type 2 diabetes mellitus without complications: Secondary | ICD-10-CM | POA: Insufficient documentation

## 2017-06-19 DIAGNOSIS — J3489 Other specified disorders of nose and nasal sinuses: Secondary | ICD-10-CM | POA: Diagnosis not present

## 2017-06-19 DIAGNOSIS — I10 Essential (primary) hypertension: Secondary | ICD-10-CM | POA: Insufficient documentation

## 2017-06-19 DIAGNOSIS — J029 Acute pharyngitis, unspecified: Secondary | ICD-10-CM

## 2017-06-19 DIAGNOSIS — Z79899 Other long term (current) drug therapy: Secondary | ICD-10-CM | POA: Diagnosis not present

## 2017-06-19 DIAGNOSIS — R079 Chest pain, unspecified: Secondary | ICD-10-CM | POA: Diagnosis present

## 2017-06-19 LAB — TSH: TSH: 0.916 u[IU]/mL (ref 0.350–4.500)

## 2017-06-19 LAB — CBC
HCT: 42 % (ref 36.0–46.0)
Hemoglobin: 13.8 g/dL (ref 12.0–15.0)
MCH: 29.5 pg (ref 26.0–34.0)
MCHC: 32.9 g/dL (ref 30.0–36.0)
MCV: 89.7 fL (ref 78.0–100.0)
PLATELETS: 430 10*3/uL — AB (ref 150–400)
RBC: 4.68 MIL/uL (ref 3.87–5.11)
RDW: 13.1 % (ref 11.5–15.5)
WBC: 6.9 10*3/uL (ref 4.0–10.5)

## 2017-06-19 LAB — BASIC METABOLIC PANEL
Anion gap: 10 (ref 5–15)
BUN: 8 mg/dL (ref 6–20)
CALCIUM: 9.1 mg/dL (ref 8.9–10.3)
CO2: 25 mmol/L (ref 22–32)
CREATININE: 0.95 mg/dL (ref 0.44–1.00)
Chloride: 105 mmol/L (ref 101–111)
GFR calc Af Amer: >60 mL/min (ref >60)
GFR calc non Af Amer: >60 mL/min (ref >60)
GLUCOSE: 95 mg/dL (ref 65–99)
Potassium: 3.3 mmol/L — ABNORMAL LOW (ref 3.5–5.1)
Sodium: 140 mmol/L (ref 135–145)

## 2017-06-19 LAB — T4, FREE: FREE T4: 0.93 ng/dL (ref 0.61–1.12)

## 2017-06-19 LAB — I-STAT TROPONIN, ED: TROPONIN I, POC: 0 ng/mL (ref 0.00–0.08)

## 2017-06-19 NOTE — ED Notes (Signed)
Hooked patient up to the monitor patient is resting with call bell in reach 

## 2017-06-19 NOTE — ED Triage Notes (Signed)
Patient complains of chest pain with SOB since the past 4 days. Arriving crying and hyperventilating. States that she has a thyroid nodule that is to biopsied in the future. Patient supported through her anxiety and HR back to 90.

## 2017-06-19 NOTE — ED Notes (Signed)
Pt verbalized understanding of d/c instructions and has no further questions. VSS, NAD. Pt removed all belongings from room. Pt has appointment with pcp tomorrow.

## 2017-06-19 NOTE — Discharge Instructions (Signed)
Please schedule an appointment with your primary care provider to follow up on your visit today. Your thyroid function is normal. You will need a repeat chest xray in 2 weeks to follow up on your abnormal chest xray today. Your chest pain may be from acid reflux, you can try zantac or prilosec to treat your symptoms. Return to the ER for new or worsening symptoms; including worsening chest pain, shortness of breath, pain that radiates to the arm or neck, pain or shortness of breath worsened with exertion.

## 2017-06-19 NOTE — ED Notes (Signed)
Pt states she recently had a thyroid test and told she has nodules possibly. States she had a hard time staying awake last pm and also had episode of not being able to move her arms and legs - feeling like she was asleep and unable to wake. Pt appears in nad. Flat affect with good eye contact

## 2017-06-19 NOTE — ED Provider Notes (Signed)
[name] Note   CSN: [id number] Arrival date & time: 06/19/17  0820     History   Chief Complaint Chief Complaint  Patient presents with  . Chest Pain    HPI Laura Horton is a 41 y.o. female w PMHx ADHD, bipolar 1 d/o, depression, DM, thrombocytosis, anxiety, presenting to the ED with 1 week of intermittent sharp chest pains that occur more frequently in the evening at rest. She states chest pains occur after meals and last a few minutes. She denies diaphoresis or nausea with chest pains.  Per chart review, pt recently had thyroid ultrasound done for nodule, with recent results recommending biopsy. She states she has been having anxiety about those results, and stating she has some pain with swallowing and feels as though she is not getting enough air in with breathing. Contrary to triage note, patient states shortness of breath is not associated with the chest pains and it is described as a tightness in her throat. She reports associated nasal congestion and postnasal drip.  The third complaint is patient states yesterday evening she felt as if she was very drowsy and unable to wake up. She states she felt like her limbs were heavy and she felt anxious. Denies slurred speech, facial droop, headache, vision changes, unilateral weakness or numbness.  Fourth complaint today is that patient has been having problems with constipation that has been treated by her gastroenterologist. She states she was recently given Linzess which provided her with some relief, however it stopped working. Denies abdominal pan, nausea, vomiting, blood in stool, fever/chills. The history is provided by the patient.    Past Medical History:  Diagnosis Date  . ADHD (attention deficit hyperactivity disorder)   . Anxiety   . Bipolar 1 disorder ([place])   . Chronic back pain   . Depression   . Diabetes mellitus without complication ([place])   . Thrombocytosis  [facility])     Patient Active Problem List   Diagnosis Date Noted  . Controlled diabetes mellitus without complication ([place]) 18/29/9371  . Left knee pain 01/05/2016  . Faintness 12/31/2015  . Syncope 12/31/2015  . Essential hypertension 12/31/2015  . Vitamin D deficiency 12/23/2014  . Menorrhagia 08/24/2014  . Abdominal pain, chronic, right lower quadrant 06/05/2014  . Snoring 05/05/2014  . Back pain 05/05/2014  . Pain in left shin 02/27/2014  . Nausea alone 01/29/2014  . Seasonal allergies 12/19/2013  . ADD (attention deficit disorder) 07/06/2013  . INSOMNIA, CHRONIC 03/10/2010  . 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 Obsessive-compulsive disorder 02/24/2010  . Obesity 09/08/2009  . Bipolar disorder ([place]) 09/08/2009    Past Surgical History:  Procedure Laterality Date  . CHOLECYSTECTOMY      OB History    No data available       Home Medications    Prior to Admission medications   Medication Sig Start Date End Date Taking? Authorizing Provider  clonazePAM (KLONOPIN) 2 MG tablet Take 2 mg by mouth 3 (three) times daily.  01/26/16  Yes [provider]  cloNIDine (CATAPRES) 0.2 MG tablet Take 1 tablet (0.2 mg total) by mouth 4 (four) times daily. Patient taking differently: Take 0.6 mg by mouth at bedtime.  09/07/15  Yes Frazier Richards, MD  Fluocinolone Acetonide Scalp 0.01 % OIL Apply 20 mLs topically daily as needed. 05/24/17  Yes Wardell Honour, MD  cetirizine (ZYRTEC) 10 MG tablet Take 1 tablet (10 mg total) by mouth daily.  01/29/14   Melony Overly, MD  Fluocinolone Acetonide 0.01 % OIL Place 1 application in ear(s) daily as needed. 05/24/17   Wardell Honour, MD  meloxicam (MOBIC) 15 MG tablet Take 1 tablet (15 mg total) by mouth daily. 03/31/16   Hudnall, Sharyn Lull, MD  montelukast (SINGULAIR) 10 MG tablet Take 1 tablet (10 mg total) by mouth at bedtime. 12/24/14   Frazier Richards, MD  omeprazole (PRILOSEC) 20 MG capsule Take 1 capsule (20 mg total) by mouth  daily as needed (for reflux). For 2 weeks, then as needed. Patient taking differently: Take 20 mg by mouth as directed. Take for 10 days in conjunction with pylera.. Take 20mg  omeprazole after morning meal and 20mg  after evening meal 07/05/15   Frazier Richards, MD    Family History Family History  Problem Relation Age of Onset  . Heart disease Mother        cardiomegaly  . Hypertension Mother   . Hyperlipidemia Mother   . Hypertension Father   . Mental illness Maternal Grandmother   . Cancer Maternal Grandmother        breast  . Depression Maternal Grandmother   . Breast cancer Maternal Grandmother   . Bipolar disorder Maternal Aunt   . Breast cancer Maternal Aunt   . ADD / ADHD Cousin   . Breast cancer Cousin   . Hypertension Sister     Social History Social History  Substance Use Topics  . Smoking status: Never Smoker  . Smokeless tobacco: Never Used  . Alcohol use No     Allergies   Geodon [ziprasidone hcl]; Seroquel [quetiapine fumarate]; Carbamazepine; Quetiapine; and Ziprasidone mesylate   Review of Systems Review of Systems  Constitutional: Negative for chills and fever.  HENT: Positive for congestion, postnasal drip, rhinorrhea and sore throat. Negative for ear pain, trouble swallowing and voice change.        Pain with swallowing  Eyes: Negative for photophobia and visual disturbance.  Respiratory: Positive for chest tightness (in her throat). Negative for cough and shortness of breath.   Cardiovascular: Positive for chest pain. Negative for palpitations and leg swelling.  Gastrointestinal: Positive for constipation. Negative for abdominal pain, blood in stool, diarrhea, nausea and rectal pain.  Genitourinary: Negative for dysuria and frequency.  Neurological: Negative for syncope, facial asymmetry, numbness and headaches.  Psychiatric/Behavioral: The patient is nervous/anxious.   All other systems reviewed and are negative.    Physical Exam Updated Vital  Signs BP 106/86 (BP Location: Left Arm)   Pulse 91   Temp 98.2 F (36.8 C) (Oral)   Resp 15   Ht 5\' 3"  (1.6 m)   Wt 94.3 kg (208 lb)   LMP 06/16/2017   SpO2 100%   BMI 36.85 kg/m   Physical Exam  Constitutional: She is oriented to person, place, and time. She appears well-developed and well-nourished. No distress.  Tolerating secretions. No stridor or increased work of breathing.  HENT:  Head: Normocephalic and atraumatic.  Mouth/Throat: Oropharynx is clear and moist.  Eyes: Conjunctivae are normal.  Neck: Normal range of motion. Neck supple. No thyromegaly present.  Cardiovascular: Normal rate, regular rhythm, normal heart sounds and intact distal pulses.  Exam reveals no friction rub.   No murmur heard. Pulmonary/Chest: Effort normal and breath sounds normal. No stridor. No respiratory distress. She has no wheezes. She has no rales. She exhibits no tenderness.  Abdominal: Soft. Bowel sounds are normal. She exhibits no distension and no mass. There  is no tenderness. There is no rebound and no guarding.  Lymphadenopathy:    She has no cervical adenopathy.  Neurological: She is alert and oriented to person, place, and time.  Mental Status:  Alert, oriented, thought content appropriate, able to give a coherent history. Speech fluent without evidence of aphasia. Able to follow 2 step commands without difficulty.  Cranial Nerves:  II:  Peripheral visual fields grossly normal, pupils equal, round, reactive to light III,IV, VI: ptosis not present, extra-ocular motions intact bilaterally  V,VII: smile symmetric, facial light touch sensation equal VIII: hearing grossly normal to voice  X: uvula elevates symmetrically  XI: bilateral shoulder shrug symmetric and strong XII: midline tongue extension without fassiculations Motor:  Normal tone. 5/5 in upper and lower extremities bilaterally including strong and equal grip strength and dorsiflexion/plantar flexion Sensory: Pinprick and  light touch normal in all extremities.  Deep Tendon Reflexes: 2+ and symmetric in the biceps and patella Cerebellar: normal finger-to-nose with bilateral upper extremities Gait: normal gait and balance CV: distal pulses palpable throughout    Skin: Skin is warm.  Psychiatric: She has a normal mood and affect. Her behavior is normal.  Nursing note and vitals reviewed.    ED Treatments / Results  Labs (all labs ordered are listed, but only abnormal results are displayed) Labs Reviewed  BASIC METABOLIC PANEL - Abnormal; Notable for the following:       Result Value   Potassium 3.3 (*)    All other components within normal limits  CBC - Abnormal; Notable for the following:    Platelets 430 (*)    All other components within normal limits  TSH  T4, FREE  I-STAT TROPONIN, ED    EKG  EKG Interpretation None       Radiology Dg Chest 2 View  Result Date: 06/19/2017 CLINICAL DATA:  Cough and congestion. EXAM: CHEST  2 VIEW COMPARISON:  08/31/2016. FINDINGS: Mediastinum hilar structures normal. Question small nodular density left upper lobe. Repeat PA lateral chest x-ray suggested. This nodular density persists nonenhanced chest CT can be obtained to further evaluate . No pleural effusion or pneumothorax. Heart size normal. IMPRESSION: Questionable tiny nodular density left upper lobe. Repeat PA lateral chest x-ray suggested. This tiny nodular density persists nonenhanced chest CT can be obtained to further evaluate. 2.  No acute abnormality otherwise noted. Electronically Signed   By: Marcello Moores  Register   On: 06/19/2017 08:59    Procedures Procedures (including critical care time)  Medications Ordered in ED Medications - No data to display   Initial Impression / Assessment and Plan / ED Course  I have reviewed the triage vital signs and the nursing notes.  Pertinent labs & imaging results that were available during my care of the patient were reviewed by me and considered in  my medical decision making (see chart for details).     Patient with multiple complaints today, including intermittent sharp chest pains times one week, dysphagia w postnasal drip, constipation. Patient is well-appearing, not in distress, tolerating secretions with patent airway. Suspect dysphagia is likely from viral illness with postnasal drip. No inc work of breathing, O2 sat 100%, no tachypnea. Chest pain does not appear to be of cardiac etiology, and is likely 2/t GERD given timing of pain. Troponin negative, EKG without ischemic changes, chest x-ray without acute pathology. Chest x-ray showing left upper lobe pulmonary nodule with recommendation for repeat chest x-ray for follow-up; discussed these results with the patient and recommended she follow  up with her PCP for repeat chest x-ray in 2 weeks. Patient with recent ultrasound of her thyroid, with results recommending biopsy. Suspect patient is very anxious w these results. She requested thyroid studies, which appear normal. Remainder of labs unremarkable. Regarding constipation, abdominal exam is benign; recommend patient follow-up with her gastroenterologist for other treatment option. Patient also with normal neurologic exam, suspect episode yesterday evening is related to her anxiety. Patient is safe for discharge at this time.  Patient discussed with and seen by Dr. Jeneen Rinks, who agrees with care plan for discharge.  Discussed results, findings, treatment and follow up. Patient advised of return precautions. Patient verbalized understanding and agreed with plan.  Final Clinical Impressions(s) / ED Diagnoses   Final diagnoses:  Sore throat  Chest pain with low risk for cardiac etiology  Chronic idiopathic constipation    New Prescriptions New Prescriptions   No medications on file     Russo, [place] N, PA-C 06/19/17 1357    Tanna Furry, MD 06/24/17 2245

## 2017-06-20 ENCOUNTER — Ambulatory Visit (INDEPENDENT_AMBULATORY_CARE_PROVIDER_SITE_OTHER): Payer: Medicare Other | Admitting: Family Medicine

## 2017-06-20 ENCOUNTER — Encounter: Payer: Self-pay | Admitting: Family Medicine

## 2017-06-20 VITALS — BP 132/72 | HR 122 | Temp 98.0°F | Resp 16 | Ht 65.75 in | Wt 211.0 lb

## 2017-06-20 DIAGNOSIS — E041 Nontoxic single thyroid nodule: Secondary | ICD-10-CM | POA: Diagnosis not present

## 2017-06-20 DIAGNOSIS — R911 Solitary pulmonary nodule: Secondary | ICD-10-CM | POA: Diagnosis not present

## 2017-06-20 DIAGNOSIS — E119 Type 2 diabetes mellitus without complications: Secondary | ICD-10-CM | POA: Diagnosis not present

## 2017-06-20 DIAGNOSIS — K219 Gastro-esophageal reflux disease without esophagitis: Secondary | ICD-10-CM

## 2017-06-20 DIAGNOSIS — F430 Acute stress reaction: Secondary | ICD-10-CM

## 2017-06-20 DIAGNOSIS — R079 Chest pain, unspecified: Secondary | ICD-10-CM

## 2017-06-20 DIAGNOSIS — J069 Acute upper respiratory infection, unspecified: Secondary | ICD-10-CM

## 2017-06-20 LAB — POCT URINALYSIS DIP (MANUAL ENTRY)
BILIRUBIN UA: NEGATIVE
BILIRUBIN UA: NEGATIVE mg/dL
GLUCOSE UA: NEGATIVE mg/dL
Nitrite, UA: NEGATIVE
Protein Ur, POC: NEGATIVE mg/dL
Spec Grav, UA: 1.01 (ref 1.010–1.025)
Urobilinogen, UA: 0.2 E.U./dL
pH, UA: 6 (ref 5.0–8.0)

## 2017-06-20 LAB — GLUCOSE, POCT (MANUAL RESULT ENTRY): POC Glucose: 77 mg/dl (ref 70–99)

## 2017-06-20 LAB — POCT GLYCOSYLATED HEMOGLOBIN (HGB A1C): Hemoglobin A1C: 5.2

## 2017-06-20 NOTE — Progress Notes (Signed)
Subjective:    Patient ID: Laura Horton, female    DOB: [birth year], 41 y.o.   MRN: [id number]  06/20/2017  Hospitalization Follow-up and Diabetes (follow-up)    HPI This 41 y.o. female presents for [place] for chest pain and SOB.  Onset yesterday with chest pain, SOB, calling out.  When woke up, happened again and got really scared.  Presented to ED.  CXR showed tiny nodular density.  Repeat CXR recommended. Troponin negative.  Had stopped Prilosec months ago.   H. Pylori positive; treated so stopped taking it.  Took two daily Prilosec.  Scheduled to see GI on 07/03/17.  No pain today in chest.  +Problems with swallowing.  Nasal congestion; excessive saliva.  Rhinorrhea; +PND.  Yellowish drainage. Onset 2-3 days.  Sneezing. +fever Tmax unsure.  Constipation: taking Linzess; no bowel movement in two weeks.  Did have bowel movement yesterday.  Also dizzy and not feeling well.  Restarted Prilosec today; feeling dizzy and fatigue.   Excessive stress over thyroid nodule. Excessive worry.    No smoking history.     BP Readings from Last 3 Encounters:  06/20/17 132/72  06/19/17 106/86  05/30/17 118/70   Wt Readings from Last 3 Encounters:  06/20/17 211 lb (95.7 kg)  06/19/17 208 lb (94.3 kg)  05/30/17 215 lb (97.5 kg)   Immunization History  Administered Date(s) Administered  . Influenza Whole 09/08/2009  . Influenza,inj,Quad PF,6+ Mos 05/23/2017    Review of Systems  Constitutional: Positive for chills, diaphoresis and fatigue. Negative for fever.  HENT: Positive for congestion, postnasal drip, rhinorrhea, sneezing, sore throat and trouble swallowing. Negative for ear pain, sinus pain and sinus pressure.   Eyes: Negative for visual disturbance.  Respiratory: Negative for cough and shortness of breath.   Cardiovascular: Positive for chest pain. Negative for palpitations and leg swelling.  Gastrointestinal: Positive for constipation. Negative for  abdominal pain, diarrhea, nausea and vomiting.  Endocrine: Negative for cold intolerance, heat intolerance, polydipsia, polyphagia and polyuria.  Neurological: Negative for dizziness, tremors, seizures, syncope, facial asymmetry, speech difficulty, weakness, light-headedness, numbness and headaches.  Psychiatric/Behavioral: The patient is nervous/anxious.     Past Medical History:  Diagnosis Date  . ADHD (attention deficit hyperactivity disorder)   . Anxiety   . Bipolar 1 disorder ([place])   . Chronic back pain   . Depression   . Diabetes mellitus without complication ([place])   . Thrombocytosis ([place])    Past Surgical History:  Procedure Laterality Date  . CHOLECYSTECTOMY     Allergies  Allergen Reactions  . Geodon [Ziprasidone Hcl] Anaphylaxis  . Seroquel [Quetiapine Fumarate] Anaphylaxis  . Carbamazepine Nausea And Vomiting and Other (See Comments)    migraine  . Quetiapine     REACTION: Weight Gain  . Ziprasidone Mesylate     REACTION: Dyspnea, swollen tongue, neck pain, torticullis   Current Outpatient Prescriptions on File Prior to Visit  Medication Sig Dispense Refill  . cetirizine (ZYRTEC) 10 MG tablet Take 1 tablet (10 mg total) by mouth daily. 30 tablet 11  . clonazePAM (KLONOPIN) 2 MG tablet Take 2 mg by mouth 3 (three) times daily.     . cloNIDine (CATAPRES) 0.2 MG tablet Take 1 tablet (0.2 mg total) by mouth 4 (four) times daily. (Patient taking differently: Take 0.6 mg by mouth at bedtime. ) 120 tablet 11  . Fluocinolone Acetonide 0.01 % OIL Place 1 application in ear(s) daily as needed. 20 mL 2  . Fluocinolone Acetonide Scalp  0.01 % OIL Apply 20 mLs topically daily as needed. 20 mL 2  . meloxicam (MOBIC) 15 MG tablet Take 1 tablet (15 mg total) by mouth daily. 30 tablet 1  . montelukast (SINGULAIR) 10 MG tablet Take 1 tablet (10 mg total) by mouth at bedtime. 30 tablet 3  . omeprazole (PRILOSEC) 20 MG capsule Take 1 capsule (20 mg total) by mouth daily as needed (for  reflux). For 2 weeks, then as needed. (Patient taking differently: Take 20 mg by mouth as directed. Take for 10 days in conjunction with pylera.. Take 20mg  omeprazole after morning meal and 20mg  after evening meal) 90 capsule 5   No current facility-administered medications on file prior to visit.    Social History   Social History  . Marital status: Single    Spouse name: N/A  . Number of children: 1  . Years of education: N/A   Occupational History  . disability     secondary to bipolar disorder   Social History Main Topics  . Smoking status: Never Smoker  . Smokeless tobacco: Never Used  . Alcohol use No  . Drug use: No  . Sexual activity: Not Currently    Birth control/ protection: IUD   Other Topics Concern  . Not on file   Social History Narrative   Marital status: single; not dating      Children: 1 son (49 yo)      Lives: with mother, son      Employment: disability for bipolar disorder since 2013.      Tobacco: never      Alcohol: none      Drugs: none      Exercise: none in 2018; previous exercise.   Family History  Problem Relation Age of Onset  . Heart disease Mother        cardiomegaly  . Hypertension Mother   . Hyperlipidemia Mother   . Hypertension Father   . Mental illness Maternal Grandmother   . Cancer Maternal Grandmother        breast  . Depression Maternal Grandmother   . Breast cancer Maternal Grandmother   . Bipolar disorder Maternal Aunt   . Breast cancer Maternal Aunt   . ADD / ADHD Cousin   . Breast cancer Cousin   . Hypertension Sister        Objective:    BP 132/72   Pulse (!) 122   Temp 98 F (36.7 C) (Oral)   Resp 16   Ht 5' 5.75" (1.67 m)   Wt 211 lb (95.7 kg)   LMP 06/16/2017   SpO2 99%   BMI 34.32 kg/m  Physical Exam  Constitutional: She is oriented to person, place, and time. She appears well-developed and well-nourished. No distress.  HENT:  Head: Normocephalic and atraumatic.  Right Ear: External ear normal.   Left Ear: External ear normal.  Nose: Nose normal.  Mouth/Throat: Oropharynx is clear and moist.  Eyes: Pupils are equal, round, and reactive to light. Conjunctivae and EOM are normal.  Neck: Normal range of motion. Neck supple. Carotid bruit is not present. Thyromegaly present.  Cardiovascular: Normal rate, regular rhythm, normal heart sounds and intact distal pulses.  Exam reveals no gallop and no friction rub.   No murmur heard. Pulmonary/Chest: Effort normal and breath sounds normal. She has no wheezes. She has no rales.  Abdominal: Soft. Bowel sounds are normal. She exhibits no distension and no mass. There is no tenderness. There is no rebound and  no guarding.  Lymphadenopathy:    She has no cervical adenopathy.  Neurological: She is alert and oriented to person, place, and time. No cranial nerve deficit.  Skin: Skin is warm and dry. No rash noted. She is not diaphoretic. No erythema. No pallor.  Psychiatric: She has a normal mood and affect. Her behavior is normal.   No results found. Depression screen [facility] 2/9 06/20/2017 05/30/2017 05/23/2017 09/07/2015 12/23/2014  Decreased Interest 1 0 3 0 0  Down, Depressed, Hopeless 1 0 3 1 1   PHQ - 2 Score 2 0 6 1 1   Altered sleeping 1 - 3 - -  Tired, decreased energy 1 - 3 - -  Change in appetite 1 - 3 - -  Feeling bad or failure about yourself  1 - 3 - -  Trouble concentrating 0 - 1 - -  Moving slowly or fidgety/restless 0 - 0 - -  Suicidal thoughts - - 0 - -  PHQ-9 Score 6 - 19 - -   Fall Risk  06/20/2017 05/30/2017 05/23/2017  Falls in the past year? Yes No Yes  Number falls in past yr: 2 or more - 2 or more  Injury with Fall? - - No         Assessment & Plan:   1. Chest pain, unspecified type   2. Controlled type 2 diabetes mellitus without complication, without long-term current use of insulin ([place])   3. Gastroesophageal reflux disease without esophagitis   4. Thyroid nodule   5. Lung nodule   6. Acute upper respiratory  infection   7. Acute stress reaction    -New onset chest pain yesterday; presented to ED for evaluation; consistent with acute stress reaction and GERD; restart Prilosec daily.  Has upcoming bx on thyroid nodule has having excessive worry about thyroid nodule; had not discussed dx with any friends or family; now has discussed with family.   --controlled DMII; continue dietary modification, exercise, weight loss. -new onset lung nodule on CXR in ED; will warrant repeat CXR at next visit. -new onset URI; supportive care with rest, Ibuprofen or Tylenol, Mucinex DM, and Afrin nasal spray.   -restart Zyrtec and Nasonex to treat allergic rhinitis.   Orders Placed This Encounter  Procedures  . Comprehensive metabolic panel  . CBC with Differential/Platelet  . POCT glucose (manual entry)  . POCT glycosylated hemoglobin (Hb A1C)  . POCT urinalysis dipstick   No orders of the defined types were placed in this encounter.   Return in about 2 weeks (around 07/04/2017) for recheck with CXR. Marland Kitchen   Anival Pasha Elayne Guerin, M.D. Primary Care at [facility] previously [facility] [place] [address] [place], [place]  [address] [phone] phone [phone] fax

## 2017-06-20 NOTE — Patient Instructions (Addendum)
  Restart Zyrtec daily. Restart Nasonex. Decrease Prilosec to one capsule daily.     IF you received an x-ray today, you will receive an invoice from [facility]. Please contact [facility] at [phone] with questions or concerns regarding your invoice.   IF you received labwork today, you will receive an invoice from [place]. Please contact [facility] at [phone] with questions or concerns regarding your invoice.   Our billing staff will not be able to assist you with questions regarding bills from these companies.  You will be contacted with the lab results as soon as they are available. The fastest way to get your results is to activate your My Chart account. Instructions are located on the last page of this paperwork. If you have not heard from [place] regarding the results in 2 weeks, please contact this office.

## 2017-06-28 ENCOUNTER — Ambulatory Visit
Admission: RE | Admit: 2017-06-28 | Discharge: 2017-06-28 | Disposition: A | Discharge status: Home / Self Care | Payer: Medicare Other | Source: Ambulatory Visit | Attending: Family Medicine | Admitting: Family Medicine

## 2017-06-28 ENCOUNTER — Other Ambulatory Visit (HOSPITAL_COMMUNITY)
Admission: RE | Admit: 2017-06-28 | Discharge: 2017-06-28 | Disposition: A | Discharge status: Home / Self Care | Payer: Medicare Other | Source: Ambulatory Visit | Attending: Radiology | Admitting: Radiology

## 2017-06-28 DIAGNOSIS — E041 Nontoxic single thyroid nodule: Secondary | ICD-10-CM | POA: Diagnosis present

## 2017-06-29 ENCOUNTER — Encounter: Payer: Self-pay | Admitting: Family Medicine

## 2017-07-06 ENCOUNTER — Ambulatory Visit: Payer: Medicare Other | Admitting: Family Medicine

## 2017-07-07 ENCOUNTER — Ambulatory Visit (INDEPENDENT_AMBULATORY_CARE_PROVIDER_SITE_OTHER): Payer: Medicare Other | Admitting: Family Medicine

## 2017-07-07 ENCOUNTER — Ambulatory Visit (INDEPENDENT_AMBULATORY_CARE_PROVIDER_SITE_OTHER): Payer: Medicare Other

## 2017-07-07 ENCOUNTER — Encounter: Payer: Self-pay | Admitting: Family Medicine

## 2017-07-07 VITALS — BP 108/68 | HR 84 | Temp 98.6°F | Resp 16 | Ht 64.57 in | Wt 212.0 lb

## 2017-07-07 DIAGNOSIS — R911 Solitary pulmonary nodule: Secondary | ICD-10-CM

## 2017-07-07 DIAGNOSIS — R0789 Other chest pain: Secondary | ICD-10-CM

## 2017-07-07 DIAGNOSIS — B373 Candidiasis of vulva and vagina: Secondary | ICD-10-CM | POA: Diagnosis not present

## 2017-07-07 DIAGNOSIS — R3989 Other symptoms and signs involving the genitourinary system: Secondary | ICD-10-CM

## 2017-07-07 DIAGNOSIS — K219 Gastro-esophageal reflux disease without esophagitis: Secondary | ICD-10-CM | POA: Diagnosis not present

## 2017-07-07 DIAGNOSIS — B3731 Acute candidiasis of vulva and vagina: Secondary | ICD-10-CM

## 2017-07-07 DIAGNOSIS — E119 Type 2 diabetes mellitus without complications: Secondary | ICD-10-CM | POA: Diagnosis not present

## 2017-07-07 DIAGNOSIS — K5901 Slow transit constipation: Secondary | ICD-10-CM | POA: Diagnosis not present

## 2017-07-07 LAB — POCT CBC
Granulocyte percent: 45.8 %G (ref 37–80)
HEMATOCRIT: 41.9 % (ref 37.7–47.9)
HEMOGLOBIN: 14.1 g/dL (ref 12.2–16.2)
Lymph, poc: 3.2 (ref 0.6–3.4)
MCH: 29.9 pg (ref 27–31.2)
MCHC: 33.6 g/dL (ref 31.8–35.4)
MCV: 89.1 fL (ref 80–97)
MID (CBC): 0.1 (ref 0–0.9)
MPV: 7.4 fL (ref 0–99.8)
POC GRANULOCYTE: 2.8 (ref 2–6.9)
POC LYMPH PERCENT: 51.9 %L — AB (ref 10–50)
POC MID %: 2.3 % (ref 0–12)
Platelet Count, POC: 433 10*3/uL — AB (ref 142–424)
RBC: 4.7 M/uL (ref 4.04–5.48)
RDW, POC: 13.5 %
WBC: 6.2 10*3/uL (ref 4.6–10.2)

## 2017-07-07 MED ORDER — LINACLOTIDE 72 MCG PO CAPS: 72.0000 ug | ORAL_CAPSULE | Freq: Every day | ORAL | 3 refills | Status: DC

## 2017-07-07 NOTE — Patient Instructions (Signed)
     IF you received an x-ray today, you will receive an invoice from [facility]. Please contact [facility] at [phone] with questions or concerns regarding your invoice.   IF you received labwork today, you will receive an invoice from [facility]. Please contact [facility] at [phone] with questions or concerns regarding your invoice.   Our billing staff will not be able to assist you with questions regarding bills from these companies.  You will be contacted with the lab results as soon as they are available. The fastest way to get your results is to activate your My Chart account. Instructions are located on the last page of this paperwork. If you have not heard from us regarding the results in 2 weeks, please contact this office.     

## 2017-07-07 NOTE — Progress Notes (Signed)
Subjective:    Patient ID: Laura Horton, female    DOB: [birth year], 41 y.o.   MRN: [id number]  07/07/2017  Follow-up (follow-up chest xray from [facility] 06/20/2017)    HPI This 41 y.o. female presents for evaluation of tiny lung nodule detected on CXR during ED visit on 06/20/17.  No recurrent chest pain.  Taking GERD medication without recurrent symptoms.   Constipation: taking Linzess causes watery diarrhea; previously took Miralax  bid. Would like to return to Miralax bid.  Trying to eat more healthy.  Urinary discoloration: onset for the past few weeks.  No dysuria, urgency; +frequency yet drinks a lot of water.  No gross blood.  Has been taking Macrobid for two days.    Yeast infection: treated with Diflucan.    Upon review of medical chart, patient underwent evaluation on 07/03/17 by [place]: Plan on 07/03/17 by Roe Coombs at [facility]: 1. Ordered repeat chest xray and FU results 2. Treat terazol 7 and take diflucan today and repeat in 2 days 3. Treat macrobid and send culture increase water 4. RF scalp solution to use PRN 5. See 3 6. Well controlled screen urine ACR  BP Readings from Last 3 Encounters:  07/07/17 108/68  06/20/17 132/72  06/19/17 106/86   Wt Readings from Last 3 Encounters:  07/07/17 212 lb (96.2 kg)  06/20/17 211 lb (95.7 kg)  06/19/17 208 lb (94.3 kg)   Immunization History  Administered Date(s) Administered  . Influenza Whole 09/08/2009  . Influenza,inj,Quad PF,6+ Mos 05/23/2017    Review of Systems  Constitutional: Negative for chills, diaphoresis, fatigue and fever.  Eyes: Negative for visual disturbance.  Respiratory: Negative for cough and shortness of breath.   Cardiovascular: Negative for chest pain, palpitations and leg swelling.  Gastrointestinal: Positive for constipation. Negative for abdominal distention, abdominal pain, anal bleeding, blood in stool, diarrhea, nausea and vomiting.  Endocrine:  Negative for cold intolerance, heat intolerance, polydipsia, polyphagia and polyuria.  Genitourinary: Positive for frequency. Negative for decreased urine volume, dysuria, flank pain, genital sores, hematuria, menstrual problem, pelvic pain, urgency, vaginal bleeding, vaginal discharge and vaginal pain.  Neurological: Negative for dizziness, tremors, seizures, syncope, facial asymmetry, speech difficulty, weakness, light-headedness, numbness and headaches.    Past Medical History:  Diagnosis Date  . ADHD (attention deficit hyperactivity disorder)   . Anxiety   . Bipolar 1 disorder ([place])   . Chronic back pain   . Depression   . Diabetes mellitus without complication ([place])   . Thrombocytosis ([place])    Past Surgical History:  Procedure Laterality Date  . CHOLECYSTECTOMY     Allergies  Allergen Reactions  . Geodon [Ziprasidone Hcl] Anaphylaxis  . Seroquel [Quetiapine Fumarate] Anaphylaxis  . Carbamazepine Nausea And Vomiting and Other (See Comments)    migraine  . Quetiapine     REACTION: Weight Gain  . Ziprasidone Mesylate     REACTION: Dyspnea, swollen tongue, neck pain, torticullis   Current Outpatient Medications on File Prior to Visit  Medication Sig Dispense Refill  . clonazePAM (KLONOPIN) 2 MG tablet Take 2 mg by mouth 3 (three) times daily.     . cloNIDine (CATAPRES) 0.2 MG tablet Take 1 tablet (0.2 mg total) by mouth 4 (four) times daily. (Patient taking differently: Take 0.6 mg by mouth at bedtime. ) 120 tablet 11  . fluocinolone (SYNALAR) 0.01 % external solution Apply topically 2 (two) times daily.    Marland Kitchen omeprazole (PRILOSEC) 20 MG capsule Take 1 capsule (20  mg total) by mouth daily as needed (for reflux). For 2 weeks, then as needed. (Patient taking differently: Take 20 mg by mouth as directed. Take for 10 days in conjunction with pylera.. Take 20mg  omeprazole after morning meal and 20mg  after evening meal) 90 capsule 5   No current facility-administered medications on  file prior to visit.    Social History   Socioeconomic History  . Marital status: Single    Spouse name: Not on file  . Number of children: 1  . Years of education: Not on file  . Highest education level: Not on file  Social Needs  . Financial resource strain: Not on file  . Food insecurity - worry: Not on file  . Food insecurity - inability: Not on file  . Transportation needs - medical: Not on file  . Transportation needs - non-medical: Not on file  Occupational History  . Occupation: disability    Comment: secondary to bipolar disorder  Tobacco Use  . Smoking status: Never Smoker  . Smokeless tobacco: Never Used  Substance and Sexual Activity  . Alcohol use: No    Alcohol/week: 0.0 oz  . Drug use: No  . Sexual activity: Not Currently    Birth control/protection: IUD  Other Topics Concern  . Not on file  Social History Narrative   Marital status: single; not dating      Children: 1 son (56 yo)      Lives: with mother, son      Employment: disability for bipolar disorder since 2013.      Tobacco: never      Alcohol: none      Drugs: none      Exercise: none in 2018; previous exercise.   Family History  Problem Relation Age of Onset  . Heart disease Mother        cardiomegaly  . Hypertension Mother   . Hyperlipidemia Mother   . Hypertension Father   . Mental illness Maternal Grandmother   . Cancer Maternal Grandmother        breast  . Depression Maternal Grandmother   . Breast cancer Maternal Grandmother   . Bipolar disorder Maternal Aunt   . Breast cancer Maternal Aunt   . ADD / ADHD Cousin   . Breast cancer Cousin   . Hypertension Sister        Objective:    BP 108/68   Pulse 84   Temp 98.6 F (37 C) (Oral)   Resp 16   Ht 5' 4.57" (1.64 m)   Wt 212 lb (96.2 kg)   LMP 06/16/2017   SpO2 100%   BMI 35.75 kg/m  Physical Exam  Constitutional: She is oriented to person, place, and time. She appears well-developed and well-nourished. No distress.    HENT:  Head: Normocephalic and atraumatic.  Right Ear: External ear normal.  Left Ear: External ear normal.  Nose: Nose normal.  Mouth/Throat: Oropharynx is clear and moist.  Eyes: Pupils are equal, round, and reactive to light. Conjunctivae and EOM are normal.  Neck: Normal range of motion. Neck supple. Carotid bruit is not present. No thyromegaly present.  Cardiovascular: Normal rate, regular rhythm, normal heart sounds and intact distal pulses.  Exam reveals no gallop and no friction rub.   No murmur heard. Pulmonary/Chest: Effort normal and breath sounds normal. She has no wheezes. She has no rales.  Abdominal: Soft. Bowel sounds are normal. She exhibits no distension and no mass. There is no tenderness. There is no  rebound and no guarding.  Lymphadenopathy:    She has no cervical adenopathy.  Neurological: She is alert and oriented to person, place, and time. No cranial nerve deficit.  Skin: Skin is warm and dry. No rash noted. She is not diaphoretic. No erythema. No pallor.  Psychiatric: She has a normal mood and affect. Her behavior is normal.   No results found. Depression screen [facility] 2/9 06/20/2017 05/30/2017 05/23/2017 09/07/2015 12/23/2014  Decreased Interest 1 0 3 0 0  Down, Depressed, Hopeless 1 0 3 1 1   PHQ - 2 Score 2 0 6 1 1   Altered sleeping 1 - 3 - -  Tired, decreased energy 1 - 3 - -  Change in appetite 1 - 3 - -  Feeling bad or failure about yourself  1 - 3 - -  Trouble concentrating 0 - 1 - -  Moving slowly or fidgety/restless 0 - 0 - -  Suicidal thoughts - - 0 - -  PHQ-9 Score 6 - 19 - -   Fall Risk  06/20/2017 05/30/2017 05/23/2017  Falls in the past year? Yes No Yes  Number falls in past yr: 2 or more - 2 or more  Injury with Fall? - - No        Assessment & Plan:   1. Nodule of left lung   2. Type 2 diabetes mellitus without complication, without long-term current use of insulin ([place])   3. Gastroesophageal reflux disease without esophagitis   4. Other  chest pain   5. Slow transit constipation   6. Urine discoloration   7. Candidiasis of vulva and vagina    New onset lung nodule on chest x-ray in the past month.  Patient presenting for repeat chest x-ray today.  Currently asymptomatic and is not suffered any recurrent chest pain.  Has been taking omeprazole daily for presumed reflux as cause of chest pain.  Has not had any recurrent chest pain at this time.  Chronic constipation and currently on Linzess which causes diarrhea.  We will decrease the dose of Linzess and see if diarrhea decreases.  If not, will switch patient back to MiraLAX twice daily.  Patient complaining of urine discoloration however underwent evaluation [facility] a provider [facility] in this week for similar symptoms.  Urine culture sent and patient currently on Macrobid.  No further workup warranted at this time.  Patient also recently diagnosed with candidiasis of the vaginal area and currently taking Diflucan.  No further treatment warranted at this time as well.  Type 2 diabetes mellitus well controlled with weight loss and diet.  Congratulations on success.  Patient underwent evaluation by a [facility] provider this week and reviewed similar issues as today with this provider.  Patient reports she desires to continue care here with me.  She does not plan to continue primary care with [facility] as this facility closer to home.   Orders Placed This Encounter  Procedures  . DG Chest 2 View    Standing Status:   Future    Number of Occurrences:   1    Standing Expiration Date:   07/07/2018    Order Specific Question:   Reason for Exam (SYMPTOM  OR DIAGNOSIS REQUIRED)    Answer:   LUL lung nodule    Order Specific Question:   Is the patient pregnant?    Answer:   No    Order Specific Question:   Preferred imaging location?    Answer:   External  .  Comprehensive metabolic panel  . POCT CBC   Meds ordered this encounter  Medications  . linaclotide (LINZESS)  72 MCG capsule    Sig: Take 1 capsule (72 mcg total) by mouth daily before breakfast.    Dispense:  30 capsule    Refill:  3    Return in about 3 months (around 10/07/2017) for follow-up chronic medical conditions.   Johnpaul Gillentine Elayne Guerin, M.D. Primary Care at [facility] [address] [place],   [id number] [phone] phone [phone] fax

## 2017-07-08 LAB — COMPREHENSIVE METABOLIC PANEL
ALBUMIN: 4.2 g/dL (ref 3.5–5.5)
ALT: 16 IU/L (ref 0–32)
AST: 17 IU/L (ref 0–40)
Albumin/Globulin Ratio: 1.3 (ref 1.2–2.2)
Alkaline Phosphatase: 110 IU/L (ref 39–117)
BUN / CREAT RATIO: 10 (ref 9–23)
BUN: 8 mg/dL (ref 6–24)
CO2: 20 mmol/L (ref 20–29)
Calcium: 9.5 mg/dL (ref 8.7–10.2)
Chloride: 103 mmol/L (ref 96–106)
Creatinine, Ser: 0.78 mg/dL (ref 0.57–1.00)
GFR calc Af Amer: 110 mL/min/{1.73_m2} (ref >59)
GFR, EST NON AFRICAN AMERICAN: 95 mL/min/{1.73_m2} (ref >59)
GLUCOSE: 108 mg/dL — AB (ref 65–99)
Globulin, Total: 3.3 g/dL (ref 1.5–4.5)
Potassium: 4 mmol/L (ref 3.5–5.2)
Sodium: 141 mmol/L (ref 134–144)
TOTAL PROTEIN: 7.5 g/dL (ref 6.0–8.5)

## 2017-07-31 DIAGNOSIS — K5901 Slow transit constipation: Secondary | ICD-10-CM | POA: Insufficient documentation

## 2017-08-05 ENCOUNTER — Encounter: Payer: Self-pay | Admitting: Family Medicine

## 2017-08-07 MED ORDER — LINACLOTIDE 72 MCG PO CAPS: 72.0000 ug | ORAL_CAPSULE | Freq: Every day | ORAL | 3 refills | Status: DC

## 2017-08-15 NOTE — Progress Notes (Signed)
Formatting of this note is different from the original.  [facility] - Medication Adherence Evaluation:    This is a medication adherence review based on refill claims history for the following medication(s): Metformin. Upon review of patient?s chart, the non-adherence issue was falsely identified due to medication documented in the chart as discontinued..      Electronically signed by Theresa L [place], CPHT at 08/15/2017  8:33 AM EST

## 2017-08-17 ENCOUNTER — Encounter: Payer: Self-pay | Admitting: Family Medicine

## 2017-08-29 ENCOUNTER — Encounter: Payer: Self-pay | Admitting: Family Medicine

## 2017-08-29 ENCOUNTER — Other Ambulatory Visit: Payer: Self-pay

## 2017-08-29 ENCOUNTER — Ambulatory Visit (INDEPENDENT_AMBULATORY_CARE_PROVIDER_SITE_OTHER): Payer: Medicare Other | Admitting: Family Medicine

## 2017-08-29 VITALS — BP 138/88 | HR 105 | Temp 98.0°F | Resp 16 | Ht 65.35 in | Wt 210.0 lb

## 2017-08-29 DIAGNOSIS — M79671 Pain in right foot: Secondary | ICD-10-CM

## 2017-08-29 DIAGNOSIS — M722 Plantar fascial fibromatosis: Secondary | ICD-10-CM

## 2017-08-29 DIAGNOSIS — D473 Essential (hemorrhagic) thrombocythemia: Secondary | ICD-10-CM | POA: Diagnosis not present

## 2017-08-29 DIAGNOSIS — D75839 Thrombocytosis, unspecified: Secondary | ICD-10-CM

## 2017-08-29 DIAGNOSIS — G8929 Other chronic pain: Secondary | ICD-10-CM

## 2017-08-29 DIAGNOSIS — E119 Type 2 diabetes mellitus without complications: Secondary | ICD-10-CM

## 2017-08-29 DIAGNOSIS — F3171 Bipolar disorder, in partial remission, most recent episode hypomanic: Secondary | ICD-10-CM

## 2017-08-29 LAB — POCT URINALYSIS DIP (MANUAL ENTRY)
BILIRUBIN UA: NEGATIVE mg/dL
Bilirubin, UA: NEGATIVE
Glucose, UA: NEGATIVE mg/dL
LEUKOCYTES UA: NEGATIVE
NITRITE UA: NEGATIVE
PH UA: 6.5 (ref 5.0–8.0)
PROTEIN UA: NEGATIVE mg/dL
Spec Grav, UA: 1.02 (ref 1.010–1.025)
Urobilinogen, UA: 0.2 E.U./dL

## 2017-08-29 LAB — GLUCOSE, POCT (MANUAL RESULT ENTRY): POC GLUCOSE: 78 mg/dL (ref 70–99)

## 2017-08-29 LAB — POCT GLYCOSYLATED HEMOGLOBIN (HGB A1C): HEMOGLOBIN A1C: 4.6

## 2017-08-29 MED ORDER — METFORMIN HCL 500 MG PO TABS: 500.0000 mg | ORAL_TABLET | Freq: Every day | ORAL | 1 refills | Status: DC

## 2017-08-29 NOTE — Patient Instructions (Addendum)
IF you received an x-ray today, you will receive an invoice from [facility]. Please contact [facility] at [phone] with questions or concerns regarding your invoice.   IF you received labwork today, you will receive an invoice from [facility]. Please contact [facility] at [phone] with questions or concerns regarding your invoice.   Our billing staff will not be able to assist you with questions regarding bills from these companies.  You will be contacted with the lab results as soon as they are available. The fastest way to get your results is to activate your My Chart account. Instructions are located on the last [place] of this paperwork. If you have not heard from [place] regarding the results in 2 weeks, please contact this office.      Plantar Fasciitis Rehab Ask your health care provider which exercises are safe for you. Do exercises exactly as told by your health care provider and adjust them as directed. It is normal to feel mild stretching, pulling, tightness, or discomfort as you do these exercises, but you should stop right away if you feel sudden pain or your pain gets worse. Do not begin these exercises until told by your health care provider. Stretching and range of motion exercises These exercises warm up your muscles and joints and improve the movement and flexibility of your foot. These exercises also help to relieve pain. Exercise A: Plantar fascia stretch  1. Sit with your left / right leg crossed over your opposite knee. 2. Hold your heel with one hand with that thumb near your arch. With your other hand, hold your toes and gently pull them back toward the top of your foot. You should feel a stretch on the bottom of your toes or your foot or both. 3. Hold this stretch for__________ seconds. 4. Slowly release your toes and return to the starting position. Repeat __________ times. Complete this exercise __________ times a day. Exercise B: Gastroc,  standing  1. Stand with your hands against a wall. 2. Extend your left / right leg behind you, and bend your front knee slightly. 3. Keeping your heels on the floor and keeping your back knee straight, shift your weight toward the wall without arching your back. You should feel a gentle stretch in your left / right calf. 4. Hold this position for __________ seconds. Repeat __________ times. Complete this exercise __________ times a day. Exercise C: Soleus, standing 1. Stand with your hands against a wall. 2. Extend your left / right leg behind you, and bend your front knee slightly. 3. Keeping your heels on the floor, bend your back knee and slightly shift your weight over the back leg. You should feel a gentle stretch deep in your calf. 4. Hold this position for __________ seconds. Repeat __________ times. Complete this exercise __________ times a day. Exercise D: Gastrocsoleus, standing 1. Stand with the ball of your left / right foot on a step. The ball of your foot is on the walking surface, right under your toes. 2. Keep your other foot firmly on the same step. 3. Hold onto the wall or a railing for balance. 4. Slowly lift your other foot, allowing your body weight to press your heel down over the edge of the step. You should feel a stretch in your left / right calf. 5. Hold this position for __________ seconds. 6. Return both feet to the step. 7. Repeat this exercise with a slight bend in your left / right knee. Repeat  __________ times with your left / right knee straight and __________ times with your left / right knee bent. Complete this exercise __________ times a day. Balance exercise This exercise builds your balance and strength control of your arch to help take pressure off your plantar fascia. Exercise E: Single leg stand 1. Without shoes, stand near a railing or in a doorway. You may hold onto the railing or door frame as needed. 2. Stand on your left / right foot. Keep your  big toe down on the floor and try to keep your arch lifted. Do not let your foot roll inward. 3. Hold this position for __________ seconds. 4. If this exercise is too easy, you can try it with your eyes closed or while standing on a pillow. Repeat __________ times. Complete this exercise __________ times a day. This information is not intended to replace advice given to you by your health care provider. Make sure you discuss any questions you have with your health care provider. Document Released: 08/21/2005 Document Revised: 04/25/2016 Document Reviewed: 07/05/2015 Elsevier Interactive Patient Education  2018 [employer]  

## 2017-08-29 NOTE — Progress Notes (Signed)
Subjective:    Patient ID: Laura Horton, female    DOB: 1976/01/22, 41 y.o.   MRN: [id number]  08/29/2017  Diabetes (1 month follow-up ) and Medication Reaction (pt feels like she is having a reaction to a medication )    HPI This 41 y.o. female presents for evaluation of bipolar disorder, diabetes mellitus II.  Having manic episodes; cut hair; thinking of people who commit suicide.  Called Dr. Lajoyce Corners to be seen.  Crying excessively; felt like hated self.  Unable to control slef.  After cutting hair, cried and upset.  In mornings, everything was really hard for patient.  Good support from family and friends.  Could only focusing on keeping self together.  Stopped doing everything.  Dr. Toy Care gave emergency visit; has been pushing Latuda for a while. Pt really hesitant to start any medication that would cause weight gain.  Willing to try medication because in bad shape. Restarted Tegretol.  Lamictal caused hair loss in the past.  Since 2008, has tried multiple medications.   Tried Tegretol again; constantly craving sugar.  Gained four pounds in one week.  Stopped Latuda.  Fasting sugars increased to 150.  Scared about sugars. Also prescribed Decadron for tooth procedure.  Unable to afford Latuda.  Effexor was very helpful with initial diagnosis.  Prescribed Lithium but stopped it because of side effects.  Tegretol caused dark urine and abdominal pain.  Chronic constipation; follow-up with GI end of January.  Stopped everything; Lajoyce Corners not aware of stopping medications.  Has not been working out due to poor mental state.  Restarted Metformin three weeks ago.  Barely holding self together.  Son is patient's moon and stars; he expressed concerns; patient decreased intake. Just had oral surgery.  Living off of ginger ale.  Was drinking smoothies.  Ate two bowls of soup this morning. Water.  Lately last month, sugars and eating have been elevated.   Almost out of Metformin.   Kaurr recommended  restarting Adderall; stated that was speed.  Only took for one month.  Took 30mg  daily. Then started feeling more depression; excessive shopping and then feeling badly and then sending it back.  Tegretol and lithium recommended.  Recommended seeing nutritionist again.    R heel pain: might need insoles; still having horrible pain.  Several months; onset since summer.  BP Readings from Last 3 Encounters:  08/29/17 138/88  07/07/17 108/68  06/20/17 132/72   Wt Readings from Last 3 Encounters:  08/29/17 210 lb (95.3 kg)  07/07/17 212 lb (96.2 kg)  06/20/17 211 lb (95.7 kg)   Immunization History  Administered Date(s) Administered  . Hepatitis B, adult 05/19/2016, 12/26/2016  . Hepatitis B, ped/adol 12/27/2015  . Influenza Whole 09/08/2009  . Influenza, Seasonal, Injecte, Preservative Fre 12/10/2015, 06/19/2016, 05/23/2017  . Influenza,inj,Quad PF,6+ Mos 05/23/2017  . Pneumococcal Polysaccharide-23 12/10/2015    Review of Systems  Constitutional: Negative for chills, diaphoresis, fatigue and fever.  Eyes: Negative for visual disturbance.  Respiratory: Negative for cough and shortness of breath.   Cardiovascular: Negative for chest pain, palpitations and leg swelling.  Gastrointestinal: Negative for abdominal pain, constipation, diarrhea, nausea and vomiting.  Endocrine: Negative for cold intolerance, heat intolerance, polydipsia, polyphagia and polyuria.  Musculoskeletal: Positive for arthralgias.  Neurological: Negative for dizziness, tremors, seizures, syncope, facial asymmetry, speech difficulty, weakness, light-headedness, numbness and headaches.  Psychiatric/Behavioral: Positive for decreased concentration and dysphoric mood. The patient is nervous/anxious.     Past Medical History:  Diagnosis Date  .  ADHD (attention deficit hyperactivity disorder)   . Anxiety   . Bipolar 1 disorder ([place])   . Chronic back pain   . Depression   . Diabetes mellitus without complication ([place])    . Thrombocytosis ([place])    Past Surgical History:  Procedure Laterality Date  . CHOLECYSTECTOMY     Allergies  Allergen Reactions  . Geodon [Ziprasidone Hcl] Anaphylaxis  . Seroquel [Quetiapine Fumarate] Anaphylaxis  . Carbamazepine Nausea And Vomiting and Other (See Comments)    migraine  . Quetiapine     REACTION: Weight Gain  . Ziprasidone Mesylate     REACTION: Dyspnea, swollen tongue, neck pain, torticullis   Current Outpatient Medications on File Prior to Visit  Medication Sig Dispense Refill  . acetaminophen-codeine (TYLENOL #3) 300-30 MG tablet Take 1 tablet by mouth every 6 (six) hours as needed. for pain  0  . amoxicillin (AMOXIL) 500 MG capsule TAKE 4 CAPSULES BY MOUTH 1 HOUR PRIOR TO SURGERY,THEN 1CAPSULE THREE TIMES A DAY UNTIL FINISHED  0  . amphetamine-dextroamphetamine (ADDERALL) 30 MG tablet TK 1 T PO Q MORNING AND 1 T PO AT NOON  0  . chlorhexidine (PERIDEX) 0.12 % solution RINSE WITH 15ML BY MOUTH FOR 30SECONDS AND EXPECTORATE TWICE A DAY FOR 2WEEKS  0  . clonazePAM (KLONOPIN) 2 MG tablet Take 2 mg by mouth 3 (three) times daily.     . cloNIDine (CATAPRES) 0.2 MG tablet Take 1 tablet (0.2 mg total) by mouth 4 (four) times daily. (Patient taking differently: Take 0.6 mg by mouth at bedtime. ) 120 tablet 11  . fluocinolone (SYNALAR) 0.01 % external solution Apply topically 2 (two) times daily.    Marland Kitchen linaclotide (LINZESS) 72 MCG capsule Take 1 capsule (72 mcg total) by mouth daily before breakfast. 90 capsule 3  . omeprazole (PRILOSEC) 20 MG capsule Take 1 capsule (20 mg total) by mouth daily as needed (for reflux). For 2 weeks, then as needed. (Patient taking differently: Take 20 mg by mouth as directed. Take for 10 days in conjunction with pylera.. Take 20mg  omeprazole after morning meal and 20mg  after evening meal) 90 capsule 5   No current facility-administered medications on file prior to visit.    Social History   Socioeconomic History  . Marital status:  Single    Spouse name: Not on file  . Number of children: 1  . Years of education: Not on file  . Highest education level: Not on file  Social Needs  . Financial resource strain: Not on file  . Food insecurity - worry: Not on file  . Food insecurity - inability: Not on file  . Transportation needs - medical: Not on file  . Transportation needs - non-medical: Not on file  Occupational History  . Occupation: disability    Comment: secondary to bipolar disorder  Tobacco Use  . Smoking status: Never Smoker  . Smokeless tobacco: Never Used  Substance and Sexual Activity  . Alcohol use: No    Alcohol/week: 0.0 oz  . Drug use: No  . Sexual activity: Not Currently    Birth control/protection: IUD  Other Topics Concern  . Not on file  Social History Narrative   Marital status: single; not dating      Children: 1 son (88 yo)      Lives: with mother, son      Employment: disability for bipolar disorder since 2013.      Tobacco: never      Alcohol: none  Drugs: none      Exercise: none in 2018; previous exercise.   Family History  Problem Relation Age of Onset  . Heart disease Mother        cardiomegaly  . Hypertension Mother   . Hyperlipidemia Mother   . Hypertension Father   . Mental illness Maternal Grandmother   . Cancer Maternal Grandmother        breast  . Depression Maternal Grandmother   . Breast cancer Maternal Grandmother   . Bipolar disorder Maternal Aunt   . Breast cancer Maternal Aunt   . ADD / ADHD Cousin   . Breast cancer Cousin   . Hypertension Sister        Objective:    BP 138/88   Pulse (!) 105   Temp 98 F (36.7 C) (Oral)   Resp 16   Ht 5' 5.35" (1.66 m)   Wt 210 lb (95.3 kg)   LMP 08/13/2017 (Approximate)   SpO2 98%   BMI 34.57 kg/m  Physical Exam  Constitutional: She is oriented to person, place, and time. She appears well-developed and well-nourished. No distress.  HENT:  Head: Normocephalic and atraumatic.  Right Ear: External  ear normal.  Left Ear: External ear normal.  Nose: Nose normal.  Mouth/Throat: Oropharynx is clear and moist.  Eyes: Conjunctivae and EOM are normal. Pupils are equal, round, and reactive to light.  Neck: Normal range of motion. Neck supple. Carotid bruit is not present. No thyromegaly present.  Cardiovascular: Normal rate, regular rhythm, normal heart sounds and intact distal pulses. Exam reveals no gallop and no friction rub.  No murmur heard. Pulmonary/Chest: Effort normal and breath sounds normal. She has no wheezes. She has no rales.  Abdominal: Soft. Bowel sounds are normal. She exhibits no distension and no mass. There is no tenderness. There is no rebound and no guarding.  Musculoskeletal:       Right foot: There is tenderness and bony tenderness. There is normal range of motion, no swelling and no deformity.  R heel: TTP at calcaneus.  Lymphadenopathy:    She has no cervical adenopathy.  Neurological: She is alert and oriented to person, place, and time. No cranial nerve deficit.  Skin: Skin is warm and dry. No rash noted. She is not diaphoretic. No erythema. No pallor.  Psychiatric: She has a normal mood and affect. Her behavior is normal.   No results found. Depression screen [facility] 2/9 08/29/2017 06/20/2017 05/30/2017 05/23/2017 09/07/2015  Decreased Interest 2 1 0 3 0  Down, Depressed, Hopeless 2 1 0 3 1  PHQ - 2 Score 4 2 0 6 1  Altered sleeping 2 1 - 3 -  Tired, decreased energy 2 1 - 3 -  Change in appetite 0 1 - 3 -  Feeling bad or failure about yourself  2 1 - 3 -  Trouble concentrating 0 0 - 1 -  Moving slowly or fidgety/restless 0 0 - 0 -  Suicidal thoughts 0 - - 0 -  PHQ-9 Score 10 6 - 19 -   Fall Risk  08/29/2017 06/20/2017 05/30/2017 05/23/2017  Falls in the past year? No Yes No Yes  Number falls in past yr: - 2 or more - 2 or more  Injury with Fall? - - - No       Assessment & Plan:   1. Controlled type 2 diabetes mellitus without complication, without  long-term current use of insulin ([place])   2. Bipolar disorder, in partial remission,  most recent episode hypomanic ([place])   3. Thrombocytosis ([place])   4. Chronic pain of right heel   5. Plantar fasciitis of right foot     Controlled diabetes despite recent noncompliance with diet and with changes in medications.  Refill of metformin provided for patient use per her discretion.  Acute exacerbation of bipolar with hypomanic state.  Encourage close follow-up with psychiatry.    New onset heel pain consistent with plantar fasciitis; supportive care recommended.  Icing recommended.  Home exercise program provided to perform daily; also recommend supportive tennis shoes with heel cups.  Thrombocytosis chronic. Followed closely by hematology.    Orders Placed This Encounter  Procedures  . CBC with Differential/Platelet  . Comprehensive metabolic panel    Order Specific Question:   Has the patient fasted?    Answer:   No  . Microalbumin, urine  . Lipid panel    Order Specific Question:   Has the patient fasted?    Answer:   No  . POCT urinalysis dipstick  . POCT glycosylated hemoglobin (Hb A1C)  . POCT glucose (manual entry)   Meds ordered this encounter  Medications  . metFORMIN (GLUCOPHAGE) 500 MG tablet    Sig: Take 1 tablet (500 mg total) by mouth daily with breakfast.    Dispense:  90 tablet    Refill:  1    No Follow-up on file.   Kristi Elayne Guerin, M.D. Primary Care at [facility] previously [facility] [address] [place], [place]  [address] [phone] phone [phone] fax

## 2017-08-30 LAB — LIPID PANEL
CHOLESTEROL TOTAL: 172 mg/dL (ref 100–199)
Chol/HDL Ratio: 4 ratio (ref 0.0–4.4)
HDL: 43 mg/dL (ref >39)
LDL CALC: 105 mg/dL — AB (ref 0–99)
Triglycerides: 121 mg/dL (ref 0–149)
VLDL CHOLESTEROL CAL: 24 mg/dL (ref 5–40)

## 2017-08-30 LAB — COMPREHENSIVE METABOLIC PANEL
A/G RATIO: 1.2 (ref 1.2–2.2)
ALK PHOS: 114 IU/L (ref 39–117)
ALT: 21 IU/L (ref 0–32)
AST: 25 IU/L (ref 0–40)
Albumin: 3.8 g/dL (ref 3.5–5.5)
BUN/Creatinine Ratio: 9 (ref 9–23)
BUN: 8 mg/dL (ref 6–24)
CHLORIDE: 101 mmol/L (ref 96–106)
CO2: 24 mmol/L (ref 20–29)
Calcium: 9.1 mg/dL (ref 8.7–10.2)
Creatinine, Ser: 0.89 mg/dL (ref 0.57–1.00)
GFR calc Af Amer: 93 mL/min/{1.73_m2} (ref >59)
GFR calc non Af Amer: 81 mL/min/{1.73_m2} (ref >59)
GLUCOSE: 82 mg/dL (ref 65–99)
Globulin, Total: 3.2 g/dL (ref 1.5–4.5)
POTASSIUM: 3.8 mmol/L (ref 3.5–5.2)
Sodium: 140 mmol/L (ref 134–144)
Total Protein: 7 g/dL (ref 6.0–8.5)

## 2017-08-30 LAB — CBC WITH DIFFERENTIAL/PLATELET
BASOS ABS: 0 10*3/uL (ref 0.0–0.2)
Basos: 0 %
EOS (ABSOLUTE): 0 10*3/uL (ref 0.0–0.4)
Eos: 1 %
Hematocrit: 39 % (ref 34.0–46.6)
Hemoglobin: 13.1 g/dL (ref 11.1–15.9)
IMMATURE GRANS (ABS): 0 10*3/uL (ref 0.0–0.1)
Immature Granulocytes: 0 %
LYMPHS ABS: 3 10*3/uL (ref 0.7–3.1)
LYMPHS: 51 %
MCH: 30 pg (ref 26.6–33.0)
MCHC: 33.6 g/dL (ref 31.5–35.7)
MCV: 89 fL (ref 79–97)
MONOS ABS: 0.3 10*3/uL (ref 0.1–0.9)
Monocytes: 5 %
NEUTROS ABS: 2.5 10*3/uL (ref 1.4–7.0)
Neutrophils: 43 %
PLATELETS: 474 10*3/uL — AB (ref 150–379)
RBC: 4.36 x10E6/uL (ref 3.77–5.28)
RDW: 13.7 % (ref 12.3–15.4)
WBC: 5.9 10*3/uL (ref 3.4–10.8)

## 2017-08-30 LAB — MICROALBUMIN, URINE: Microalbumin, Urine: 6.1 ug/mL

## 2017-09-05 ENCOUNTER — Encounter: Payer: Self-pay | Admitting: Family Medicine

## 2017-09-07 ENCOUNTER — Other Ambulatory Visit: Payer: Self-pay | Admitting: Family Medicine

## 2017-09-07 DIAGNOSIS — Z1231 Encounter for screening mammogram for malignant neoplasm of breast: Secondary | ICD-10-CM

## 2017-09-26 ENCOUNTER — Ambulatory Visit: Payer: Medicare Other

## 2017-10-01 DIAGNOSIS — K5900 Constipation, unspecified: Secondary | ICD-10-CM | POA: Diagnosis not present

## 2017-10-01 DIAGNOSIS — R109 Unspecified abdominal pain: Secondary | ICD-10-CM | POA: Diagnosis not present

## 2017-10-08 ENCOUNTER — Ambulatory Visit (INDEPENDENT_AMBULATORY_CARE_PROVIDER_SITE_OTHER): Payer: Medicare Other

## 2017-10-08 ENCOUNTER — Other Ambulatory Visit: Payer: Self-pay

## 2017-10-08 ENCOUNTER — Encounter: Payer: Self-pay | Admitting: Family Medicine

## 2017-10-08 ENCOUNTER — Ambulatory Visit (INDEPENDENT_AMBULATORY_CARE_PROVIDER_SITE_OTHER): Payer: Medicare Other | Admitting: Family Medicine

## 2017-10-08 ENCOUNTER — Ambulatory Visit: Payer: Medicare Other

## 2017-10-08 VITALS — BP 122/80 | HR 100 | Ht 63.0 in | Wt 209.1 lb

## 2017-10-08 VITALS — BP 120/82 | HR 117 | Temp 98.0°F | Resp 16 | Ht 64.57 in | Wt 209.0 lb

## 2017-10-08 DIAGNOSIS — Z0000 Encounter for general adult medical examination without abnormal findings: Secondary | ICD-10-CM

## 2017-10-08 DIAGNOSIS — D473 Essential (hemorrhagic) thrombocythemia: Secondary | ICD-10-CM | POA: Diagnosis not present

## 2017-10-08 DIAGNOSIS — F3171 Bipolar disorder, in partial remission, most recent episode hypomanic: Secondary | ICD-10-CM

## 2017-10-08 DIAGNOSIS — E119 Type 2 diabetes mellitus without complications: Secondary | ICD-10-CM | POA: Diagnosis not present

## 2017-10-08 DIAGNOSIS — D72820 Lymphocytosis (symptomatic): Secondary | ICD-10-CM | POA: Diagnosis not present

## 2017-10-08 DIAGNOSIS — K5901 Slow transit constipation: Secondary | ICD-10-CM | POA: Diagnosis not present

## 2017-10-08 DIAGNOSIS — Z598 Other problems related to housing and economic circumstances: Secondary | ICD-10-CM

## 2017-10-08 DIAGNOSIS — Z599 Problem related to housing and economic circumstances, unspecified: Secondary | ICD-10-CM

## 2017-10-08 DIAGNOSIS — D75839 Thrombocytosis, unspecified: Secondary | ICD-10-CM

## 2017-10-08 NOTE — Progress Notes (Signed)
Subjective:   Laura Horton is a 42 y.o. female who presents for Medicare Annual (Subsequent) preventive examination.  Review of Systems:  N/A Cardiac Risk Factors include: diabetes mellitus;obesity (BMI >30kg/m2)     Objective:     Vitals: BP 122/80   Pulse 100   Ht 5\' 3"  (1.6 m)   Wt 209 lb 2 oz (94.9 kg)   SpO2 99%   BMI 37.04 kg/m   Body mass index is 37.04 kg/m.  Advanced Directives 10/08/2017 06/19/2017 11/22/2016 08/31/2016 08/04/2016 04/16/2016 01/28/2016  Does Patient Have a Medical Advance Directive? No No No No No No No  Would patient like information on creating a medical advance directive? Yes (MAU/Ambulatory/Procedural Areas - Information given) No - Patient declined No - Patient declined No - Patient declined - No - patient declined information No - patient declined information    Tobacco Social History   Tobacco Use  Smoking Status Never Smoker  Smokeless Tobacco Never Used     Counseling given: Not Answered   Clinical Intake:  Pre-visit preparation completed: Yes  Pain : 0-10 Pain Score: 7  Pain Type: Chronic pain Pain Location: Abdomen(right knee) Pain Descriptors / Indicators: Aching Pain Onset: More than a month ago Pain Frequency: Constant     Nutritional Status: BMI > 30  Obese Nutritional Risks: None Diabetes: Yes CBG done?: No Did pt. bring in CBG monitor from home?: No  How often do you need to have someone help you when you read instructions, pamphlets, or other written materials from your doctor or pharmacy?: 1 - Never What is the last grade level you completed in school?: Some college  Interpreter Needed?: No  Information entered by :: Andrez Grime, LPN  Past Medical History:  Diagnosis Date  . ADHD (attention deficit hyperactivity disorder)   . Anxiety   . Bipolar 1 disorder ([place])   . Chronic back pain   . Depression   . Diabetes mellitus without complication ([place])   . Thrombocytosis ([place])    Past Surgical  History:  Procedure Laterality Date  . CHOLECYSTECTOMY     Family History  Problem Relation Age of Onset  . Heart disease Mother        cardiomegaly  . Hypertension Mother   . Hyperlipidemia Mother   . Hypertension Father   . Mental illness Maternal Grandmother   . Cancer Maternal Grandmother        breast  . Depression Maternal Grandmother   . Breast cancer Maternal Grandmother   . Bipolar disorder Maternal Aunt   . Breast cancer Maternal Aunt   . ADD / ADHD Cousin   . Breast cancer Cousin   . Hypertension Sister    Social History   Socioeconomic History  . Marital status: Single    Spouse name: None  . Number of children: 1  . Years of education: None  . Highest education level: Some college, no degree  Social Needs  . Financial resource strain: Somewhat hard  . Food insecurity - worry: Never true  . Food insecurity - inability: Never true  . Transportation needs - medical: No  . Transportation needs - non-medical: No  Occupational History  . Occupation: disability    Comment: secondary to bipolar disorder  Tobacco Use  . Smoking status: Never Smoker  . Smokeless tobacco: Never Used  Substance and Sexual Activity  . Alcohol use: No    Alcohol/week: 0.0 oz  . Drug use: No  . Sexual activity: Not Currently  Birth control/protection: IUD  Other Topics Concern  . None  Social History Narrative   Marital status: single; not dating      Children: 1 son (21 yo)      Lives: with mother, son      Employment: disability for bipolar disorder since 2013.      Tobacco: never      Alcohol: none      Drugs: none      Exercise: none in 2018; previous exercise.    Outpatient Encounter Medications as of 10/08/2017  Medication Sig  . clonazePAM (KLONOPIN) 2 MG tablet Take 2 mg by mouth 3 (three) times daily.   . cloNIDine (CATAPRES) 0.2 MG tablet Take 1 tablet (0.2 mg total) by mouth 4 (four) times daily. (Patient taking differently: Take 0.6 mg by mouth at bedtime. )    . metFORMIN (GLUCOPHAGE) 500 MG tablet Take 1 tablet (500 mg total) by mouth daily with breakfast.  . omeprazole (PRILOSEC) 20 MG capsule Take 1 capsule (20 mg total) by mouth daily as needed (for reflux). For 2 weeks, then as needed. (Patient taking differently: Take 20 mg by mouth as directed. Take for 10 days in conjunction with pylera.. Take 20mg  omeprazole after morning meal and 20mg  after evening meal)  . polyethylene glycol (MIRALAX / GLYCOLAX) packet Take 17 g by mouth daily.  . [DISCONTINUED] acetaminophen-codeine (TYLENOL #3) 300-30 MG tablet Take 1 tablet by mouth every 6 (six) hours as needed. for pain  . [DISCONTINUED] amoxicillin (AMOXIL) 500 MG capsule TAKE 4 CAPSULES BY MOUTH 1 HOUR PRIOR TO SURGERY,THEN 1CAPSULE THREE TIMES A DAY UNTIL FINISHED  . [DISCONTINUED] amphetamine-dextroamphetamine (ADDERALL) 30 MG tablet TK 1 T PO Q MORNING AND 1 T PO AT NOON  . [DISCONTINUED] chlorhexidine (PERIDEX) 0.12 % solution RINSE WITH 15ML BY MOUTH FOR 30SECONDS AND EXPECTORATE TWICE A DAY FOR 2WEEKS  . [DISCONTINUED] fluocinolone (SYNALAR) 0.01 % external solution Apply topically 2 (two) times daily.  . [DISCONTINUED] linaclotide (LINZESS) 72 MCG capsule Take 1 capsule (72 mcg total) by mouth daily before breakfast.   No facility-administered encounter medications on file as of 10/08/2017.     Activities of Daily Living In your present state of health, do you have any difficulty performing the following activities: 10/08/2017  Hearing? N  Vision? N  Difficulty concentrating or making decisions? Y  Comment Patient has issues with remembering small things   Walking or climbing stairs? Y  Comment Patient has right knee pain that makes climbing stairs difficult  Dressing or bathing? N  Doing errands, shopping? N  Preparing Food and eating ? N  Using the Toilet? N  In the past six months, have you accidently leaked urine? N  Do you have problems with loss of bowel control? N  Managing your  Medications? N  Managing your Finances? N  Housekeeping or managing your Housekeeping? N  Some recent data might be hidden    Patient Care Team: Wardell Honour, MD as PCP - General (Family Medicine) Warden Fillers, MD as Consulting Physician (Ophthalmology) Arta Silence, MD as Consulting Physician (Gastroenterology) Verdell Carmine, MD as Referring Physician (Internal Medicine)    Assessment:   This is a routine wellness examination for [place].  Exercise Activities and Dietary recommendations Current Exercise Habits: Home exercise routine, Type of exercise: strength training/weights(kickboxing), Time (Minutes): 60, Frequency (Times/Week): 6, Weekly Exercise (Minutes/Week): 360, Intensity: Moderate, Exercise limited by: None identified  Goals    . Exercise 3x per week (30 min per  time)     Patient wants to try to exercise more on a consistent basis, lose some weight and eat more fruits and vegetables.        Fall Risk Fall Risk  10/08/2017 08/29/2017 06/20/2017 05/30/2017 05/23/2017  Falls in the past year? Yes No Yes No Yes  Number falls in past yr: 2 or more - 2 or more - 2 or more  Injury with Fall? Yes - - - No  Comment fractured ankle  - - - -  Risk Factor Category  High Fall Risk - - - -  Risk for fall due to : History of fall(s) - - - -  Follow up Falls prevention discussed - - - -   Is the patient's home free of loose throw rugs in walkways, pet beds, electrical cords, etc?   yes      Grab bars in the bathroom? no      Handrails on the stairs?   yes      Adequate lighting?   yes  Timed Get Up and Go performed: yes, completed within 30 seocnds  Depression Screen PHQ 2/9 Scores 10/08/2017 08/29/2017 06/20/2017 05/30/2017  PHQ - 2 Score 6 4 2  0  PHQ- 9 Score 16 10 6  -     Cognitive Function     6CIT Screen 10/08/2017  What Year? 0 points  What month? 0 points  What time? 0 points  Count back from 20 0 points  Months in reverse 0 points  Repeat phrase 0  points  Total Score 0    Immunization History  Administered Date(s) Administered  . Hepatitis B, adult 05/19/2016, 12/26/2016  . Hepatitis B, ped/adol 12/27/2015  . Influenza Whole 09/08/2009  . Influenza, Seasonal, Injecte, Preservative Fre 12/10/2015, 06/19/2016, 05/23/2017  . Influenza,inj,Quad PF,6+ Mos 05/23/2017  . Pneumococcal Polysaccharide-23 12/10/2015    Qualifies for Shingles Vaccine?Shingles vaccine at age 33  Screening Tests Health Maintenance  Topic Date Due  . OPHTHALMOLOGY EXAM  07/30/1986  . HIV Screening  07/31/1991  . PAP SMEAR  08/24/2017  . HEMOGLOBIN A1C  02/27/2018  . FOOT EXAM  05/23/2018  . URINE MICROALBUMIN  08/29/2018  . PNEUMOCOCCAL POLYSACCHARIDE VACCINE (2) 12/09/2020  . TETANUS/TDAP  03/03/2027  . INFLUENZA VACCINE  Completed    Cancer Screenings: Lung: Low Dose CT Chest recommended if Age 73-80 years, 30 pack-year currently smoking OR have quit w/in 15years. Patient does not qualify. Breast:  Up to date on Mammogram? Yes, scheduled for 10/15/17 @ 4:20 pm   Up to date of Bone Density/Dexa? N/A, starts at age 34 Colorectal: completed this on 08/03/2016 per patient  Additional Screenings:  Hepatitis B/HIV/Syphillis: Patient states she completed HIV screening last yearand will have results sent to our office from her specialist office. Hep B and Syphillis not indicated Hepatitis C Screening: not indicated      Plan:   I have personally reviewed and noted the following in the patient's chart:   . Medical and social history . Use of alcohol, tobacco or illicit drugs  . Current medications and supplements . Functional ability and status . Nutritional status . Physical activity . Advanced directives . List of other physicians . Hospitalizations, surgeries, and ER visits in previous 12 months . Vitals . Screenings to include cognitive, depression, and falls . Referrals and appointments  In addition, I have reviewed and discussed with  patient certain preventive protocols, quality metrics, and best practice recommendations. A written personalized care plan for preventive services  as well as general preventive health recommendations were provided to patient.   1. Financial difficulties - Ambulatory referral to [place] Team  2. Encounter for Medicare annual wellness exam    Andrez Grime, LPN  05/08/5037

## 2017-10-08 NOTE — Patient Instructions (Addendum)
IF you received an x-ray today, you will receive an invoice from [facility]. Please contact [facility] at [phone] with questions or concerns regarding your invoice.   IF you received labwork today, you will receive an invoice from  [facility]. Please contact [facility] at [phone] with questions or concerns regarding your invoice.   Our billing staff will not be able to assist you with questions regarding bills from these companies.  You will be contacted with the lab results as soon as they are available. The fastest way to get your results is to activate your My Chart account. Instructions are located on the last page of this paperwork. If you have not heard from [place] regarding the results in 2 weeks, please contact this office.      Preventive Care 40-64 Years, Female Preventive care refers to lifestyle choices and visits with your health care provider that can promote health and wellness. What does preventive care include?  A yearly physical exam. This is also called an annual well check.  Dental exams once or twice a year.  Routine eye exams. Ask your health care provider how often you should have your eyes checked.  Personal lifestyle choices, including: ? Daily care of your teeth and gums. ? Regular physical activity. ? Eating a healthy diet. ? Avoiding tobacco and drug use. ? Limiting alcohol use. ? Practicing safe sex. ? Taking low-dose aspirin daily starting at age 40. ? Taking vitamin and mineral supplements as recommended by your health care provider. What happens during an annual well check? The services and screenings done by your health care provider during your annual well check will depend on your age, overall health, lifestyle risk factors, and family history of disease. Counseling Your health care provider may ask you questions about your:  Alcohol use.  Tobacco use.  Drug use.  Emotional well-being.  Home and relationship  well-being.  Sexual activity.  Eating habits.  Work and work Statistician.  Method of birth control.  Menstrual cycle.  Pregnancy history.  Screening You may have the following tests or measurements:  Height, weight, and BMI.  Blood pressure.  Lipid and cholesterol levels. These may be checked every 5 years, or more frequently if you are over 46 years old.  Skin check.  Lung cancer screening. You may have this screening every year starting at age 38 if you have a 30-pack-year history of smoking and currently smoke or have quit within the past 15 years.  Fecal occult blood test (FOBT) of the stool. You may have this test every year starting at age 33.  Flexible sigmoidoscopy or colonoscopy. You may have a sigmoidoscopy every 5 years or a colonoscopy every 10 years starting at age 35.  Hepatitis C blood test.  Hepatitis B blood test.  Sexually transmitted disease (STD) testing.  Diabetes screening. This is done by checking your blood sugar (glucose) after you have not eaten for a while (fasting). You may have this done every 1-3 years.  Mammogram. This may be done every 1-2 years. Talk to your health care provider about when you should start having regular mammograms. This may depend on whether you have a family history of breast cancer.  BRCA-related cancer screening. This may be done if you have a family history of breast, ovarian, tubal, or peritoneal cancers.  Pelvic exam and Pap test. This may be done every 3 years starting at age 34. Starting at age 14, this may be done every 5 years if  you have a Pap test in combination with an HPV test.  Bone density scan. This is done to screen for osteoporosis. You may have this scan if you are at high risk for osteoporosis.  Discuss your test results, treatment options, and if necessary, the need for more tests with your health care provider. Vaccines Your health care provider may recommend certain vaccines, such  as:  Influenza vaccine. This is recommended every year.  Tetanus, diphtheria, and acellular pertussis (Tdap, Td) vaccine. You may need a Td booster every 10 years.  Varicella vaccine. You may need this if you have not been vaccinated.  Zoster vaccine. You may need this after age 77.  Measles, mumps, and rubella (MMR) vaccine. You may need at least one dose of MMR if you were born in 1957 or later. You may also need a second dose.  Pneumococcal 13-valent conjugate (PCV13) vaccine. You may need this if you have certain conditions and were not previously vaccinated.  Pneumococcal polysaccharide (PPSV23) vaccine. You may need one or two doses if you smoke cigarettes or if you have certain conditions.  Meningococcal vaccine. You may need this if you have certain conditions.  Hepatitis A vaccine. You may need this if you have certain conditions or if you travel or work in places where you may be exposed to hepatitis A.  Hepatitis B vaccine. You may need this if you have certain conditions or if you travel or work in places where you may be exposed to hepatitis B.  Haemophilus influenzae type b (Hib) vaccine. You may need this if you have certain conditions.  Talk to your health care provider about which screenings and vaccines you need and how often you need them. This information is not intended to replace advice given to you by your health care provider. Make sure you discuss any questions you have with your health care provider. Document Released: 09/17/2015 Document Revised: 05/10/2016 Document Reviewed: 06/22/2015 Elsevier Interactive Patient Education  [employer].

## 2017-10-08 NOTE — Patient Instructions (Addendum)
[name] , Thank you for taking time to come for your Medicare Wellness Visit. I appreciate your ongoing commitment to your health goals. Please review the following plan we discussed and let me know if I can assist you in the future.   Screening recommendations/referrals: Colonoscopy: screening starts at age 42, but you state you completed this on 08/03/2016 Mammogram: Scheduled for 10/15/17 @ 4:20 pm Bone Density: starts at age 57  Recommended yearly ophthalmology/optometry visit for glaucoma screening and checkup Recommended yearly dental visit for hygiene and checkup  Vaccinations: Influenza vaccine: up to date Pneumococcal vaccine: Prevnar 11 at age 59 Tdap vaccine: up to date, next due 03/03/2027 Shingles vaccine: starts at age 71    Advanced directives:  Advance directive discussed with you today. I have provided a copy for you to complete at home and have notarized. Once this is complete please bring a copy in to our office so we can scan it into your chart.   Conditions/risks identified: Try to exercise more on a consistent basis, lose some weight and eat more fruits and vegetables.  Next appointment: today at 4:20 pm with Dr. Tamala Julian, 1 year for next AWV   Preventive Care 40-64 Years, Female Preventive care refers to lifestyle choices and visits with your health care provider that can promote health and wellness. What does preventive care include?  A yearly physical exam. This is also called an annual well check.  Dental exams once or twice a year.  Routine eye exams. Ask your health care provider how often you should have your eyes checked.  Personal lifestyle choices, including:  Daily care of your teeth and gums.  Regular physical activity.  Eating a healthy diet.  Avoiding tobacco and drug use.  Limiting alcohol use.  Practicing safe sex.  Taking low-dose aspirin daily starting at age 92.  Taking vitamin and mineral supplements as recommended by your  health care provider. What happens during an annual well check? The services and screenings done by your health care provider during your annual well check will depend on your age, overall health, lifestyle risk factors, and family history of disease. Counseling  Your health care provider may ask you questions about your:  Alcohol use.  Tobacco use.  Drug use.  Emotional well-being.  Home and relationship well-being.  Sexual activity.  Eating habits.  Work and work Statistician.  Method of birth control.  Menstrual cycle.  Pregnancy history. Screening  You may have the following tests or measurements:  Height, weight, and BMI.  Blood pressure.  Lipid and cholesterol levels. These may be checked every 5 years, or more frequently if you are over 48 years old.  Skin check.  Lung cancer screening. You may have this screening every year starting at age 31 if you have a 30-pack-year history of smoking and currently smoke or have quit within the past 15 years.  Fecal occult blood test (FOBT) of the stool. You may have this test every year starting at age 37.  Flexible sigmoidoscopy or colonoscopy. You may have a sigmoidoscopy every 5 years or a colonoscopy every 10 years starting at age 71.  Hepatitis C blood test.  Hepatitis B blood test.  Sexually transmitted disease (STD) testing.  Diabetes screening. This is done by checking your blood sugar (glucose) after you have not eaten for a while (fasting). You may have this done every 1-3 years.  Mammogram. This may be done every 1-2 years. Talk to your health care provider about when  you should start having regular mammograms. This may depend on whether you have a family history of breast cancer.  BRCA-related cancer screening. This may be done if you have a family history of breast, ovarian, tubal, or peritoneal cancers.  Pelvic exam and Pap test. This may be done every 3 years starting at age 34. Starting at age 52,  this may be done every 5 years if you have a Pap test in combination with an HPV test.  Bone density scan. This is done to screen for osteoporosis. You may have this scan if you are at high risk for osteoporosis. Discuss your test results, treatment options, and if necessary, the need for more tests with your health care provider. Vaccines  Your health care provider may recommend certain vaccines, such as:  Influenza vaccine. This is recommended every year.  Tetanus, diphtheria, and acellular pertussis (Tdap, Td) vaccine. You may need a Td booster every 10 years.  Zoster vaccine. You may need this after age 54.  Pneumococcal 13-valent conjugate (PCV13) vaccine. You may need this if you have certain conditions and were not previously vaccinated.  Pneumococcal polysaccharide (PPSV23) vaccine. You may need one or two doses if you smoke cigarettes or if you have certain conditions. Talk to your health care provider about which screenings and vaccines you need and how often you need them. This information is not intended to replace advice given to you by your health care provider. Make sure you discuss any questions you have with your health care provider. Document Released: 09/17/2015 Document Revised: 05/10/2016 Document Reviewed: 06/22/2015 Elsevier Interactive Patient Education  2017 [place] Prevention in the Home Falls can cause injuries. They can happen to people of all ages. There are many things you can do to make your home safe and to help prevent falls. What can I do on the outside of my home?  Regularly fix the edges of walkways and driveways and fix any cracks.  Remove anything that might make you trip as you walk through a door, such as a raised step or threshold.  Trim any bushes or trees on the path to your home.  Use bright outdoor lighting.  Clear any walking paths of anything that might make someone trip, such as rocks or tools.  Regularly check to see  if handrails are loose or broken. Make sure that both sides of any steps have handrails.  Any raised decks and porches should have guardrails on the edges.  Have any leaves, snow, or ice cleared regularly.  Use sand or salt on walking paths during winter.  Clean up any spills in your garage right away. This includes oil or grease spills. What can I do in the bathroom?  Use night lights.  Install grab bars by the toilet and in the tub and shower. Do not use towel bars as grab bars.  Use non-skid mats or decals in the tub or shower.  If you need to sit down in the shower, use a plastic, non-slip stool.  Keep the floor dry. Clean up any water that spills on the floor as soon as it happens.  Remove soap buildup in the tub or shower regularly.  Attach bath mats securely with double-sided non-slip rug tape.  Do not have throw rugs and other things on the floor that can make you trip. What can I do in the bedroom?  Use night lights.  Make sure that you have a light by your bed that  is easy to reach.  Do not use any sheets or blankets that are too big for your bed. They should not hang down onto the floor.  Have a firm chair that has side arms. You can use this for support while you get dressed.  Do not have throw rugs and other things on the floor that can make you trip. What can I do in the kitchen?  Clean up any spills right away.  Avoid walking on wet floors.  Keep items that you use a lot in easy-to-reach places.  If you need to reach something above you, use a strong step stool that has a grab bar.  Keep electrical cords out of the way.  Do not use floor polish or wax that makes floors slippery. If you must use wax, use non-skid floor wax.  Do not have throw rugs and other things on the floor that can make you trip. What can I do with my stairs?  Do not leave any items on the stairs.  Make sure that there are handrails on both sides of the stairs and use them.  Fix handrails that are broken or loose. Make sure that handrails are as long as the stairways.  Check any carpeting to make sure that it is firmly attached to the stairs. Fix any carpet that is loose or worn.  Avoid having throw rugs at the top or bottom of the stairs. If you do have throw rugs, attach them to the floor with carpet tape.  Make sure that you have a light switch at the top of the stairs and the bottom of the stairs. If you do not have them, ask someone to add them for you. What else can I do to help prevent falls?  Wear shoes that:  Do not have high heels.  Have rubber bottoms.  Are comfortable and fit you well.  Are closed at the toe. Do not wear sandals.  If you use a stepladder:  Make sure that it is fully opened. Do not climb a closed stepladder.  Make sure that both sides of the stepladder are locked into place.  Ask someone to hold it for you, if possible.  Clearly mark and make sure that you can see:  Any grab bars or handrails.  First and last steps.  Where the edge of each step is.  Use tools that help you move around (mobility aids) if they are needed. These include:  Canes.  Walkers.  Scooters.  Crutches.  Turn on the lights when you go into a dark area. Replace any light bulbs as soon as they burn out.  Set up your furniture so you have a clear path. Avoid moving your furniture around.  If any of your floors are uneven, fix them.  If there are any pets around you, be aware of where they are.  Review your medicines with your doctor. Some medicines can make you feel dizzy. This can increase your chance of falling. Ask your doctor what other things that you can do to help prevent falls. This information is not intended to replace advice given to you by your health care provider. Make sure you discuss any questions you have with your health care provider. Document Released: 06/17/2009 Document Revised: 01/27/2016 Document Reviewed:  09/25/2014 Elsevier Interactive Patient Education  2017 [employer].

## 2017-10-08 NOTE — Progress Notes (Signed)
Subjective:    Patient ID: Laura Horton, female    DOB: 07-08-1976, 42 y.o.   MRN: [id number]  10/08/2017  Annual Exam    HPI This 42 y.o. female presents for COMPLETE PHYSICAL EXAMINATION.   Last physical:  [name] 10-08-17. Pap smear:  10-04-2016 WNL: HPV negative. Having periods (4-5 days/moderate flow/some cramping two days).  Not sexually active. Mammogram:  10-15-2017 scheduled. Colonoscopy:  2017 Eye exam:   Due to schedule.  Dental exam:  Last month.  No exam data present  BP Readings from Last 3 Encounters:  10/08/17 120/82  10/08/17 122/80  08/29/17 138/88   Wt Readings from Last 3 Encounters:  10/08/17 209 lb (94.8 kg)  10/08/17 209 lb 2 oz (94.9 kg)  08/29/17 210 lb (95.3 kg)   Immunization History  Administered Date(s) Administered  . Hepatitis B, adult 05/19/2016, 12/26/2016  . Hepatitis B, ped/adol 12/27/2015  . Influenza Whole 09/08/2009  . Influenza, Seasonal, Injecte, Preservative Fre 12/10/2015, 06/19/2016, 05/23/2017  . Influenza,inj,Quad PF,6+ Mos 05/23/2017  . Pneumococcal Polysaccharide-23 12/10/2015  . Tdap 03/02/2017   Constipation: increased to 290mg  by GI last week; horrible constipation.  Miserable.  DMII: Patient reports good compliance with medication, good tolerance to medication, and good symptom control.   GERD: Patient reports good compliance with medication, good tolerance to medication, and good symptom control.    Bipolar disorder:  Lithium.  Review of Systems  Constitutional: Negative for activity change, appetite change, chills, diaphoresis, fatigue, fever and unexpected weight change.  HENT: Negative for congestion, dental problem, drooling, ear discharge, ear pain, facial swelling, hearing loss, mouth sores, nosebleeds, postnasal drip, rhinorrhea, sinus pressure, sneezing, sore throat, tinnitus, trouble swallowing and voice change.   Eyes: Negative for photophobia, pain, discharge, redness, itching and visual  disturbance.  Respiratory: Negative for apnea, cough, choking, chest tightness, shortness of breath, wheezing and stridor.   Cardiovascular: Negative for chest pain, palpitations and leg swelling.  Gastrointestinal: Positive for abdominal pain and constipation. Negative for abdominal distention, anal bleeding, blood in stool, diarrhea, nausea, rectal pain and vomiting.  Endocrine: Negative for cold intolerance, heat intolerance, polydipsia, polyphagia and polyuria.  Genitourinary: Negative for decreased urine volume, difficulty urinating, dyspareunia, dysuria, enuresis, flank pain, frequency, genital sores, hematuria, menstrual problem, pelvic pain, urgency, vaginal bleeding, vaginal discharge and vaginal pain.       Nocturia x 0.  No leakage of urine.  Musculoskeletal: Positive for arthralgias and back pain. Negative for gait problem, joint swelling, myalgias, neck pain and neck stiffness.  Skin: Negative for color change, pallor, rash and wound.  Allergic/Immunologic: Negative for environmental allergies, food allergies and immunocompromised state.  Neurological: Negative for dizziness, tremors, seizures, syncope, facial asymmetry, speech difficulty, weakness, light-headedness, numbness and headaches.  Hematological: Negative for adenopathy. Does not bruise/bleed easily.  Psychiatric/Behavioral: Positive for dysphoric mood. Negative for agitation, behavioral problems, confusion, decreased concentration, hallucinations, self-injury, sleep disturbance and suicidal ideas. The patient is nervous/anxious. The patient is not hyperactive.        Bedtime 9-10.  Wakes up 500.      Past Medical History:  Diagnosis Date  . ADHD (attention deficit hyperactivity disorder)   . Anxiety   . Bipolar 1 disorder ([place])   . Chronic back pain   . Depression   . Diabetes mellitus without complication ([place])   . Thrombocytosis ([place])    Past Surgical History:  Procedure Laterality Date  . CHOLECYSTECTOMY      Allergies  Allergen Reactions  . Geodon [  Ziprasidone Hcl] Anaphylaxis  . Seroquel [Quetiapine Fumarate] Anaphylaxis  . Carbamazepine Nausea And Vomiting and Other (See Comments)    migraine  . Quetiapine     REACTION: Weight Gain  . Ziprasidone Mesylate     REACTION: Dyspnea, swollen tongue, neck pain, torticullis   Current Outpatient Medications on File Prior to Visit  Medication Sig Dispense Refill  . clonazePAM (KLONOPIN) 2 MG tablet Take 2 mg by mouth 3 (three) times daily.     . cloNIDine (CATAPRES) 0.2 MG tablet Take 1 tablet (0.2 mg total) by mouth 4 (four) times daily. (Patient taking differently: Take 0.4 mg by mouth at bedtime. ) 120 tablet 11  . lithium carbonate (ESKALITH) 450 MG CR tablet Take 450 mg by mouth daily.    . metFORMIN (GLUCOPHAGE) 500 MG tablet Take 1 tablet (500 mg total) by mouth daily with breakfast. 90 tablet 1  . omeprazole (PRILOSEC) 20 MG capsule Take 1 capsule (20 mg total) by mouth daily as needed (for reflux). For 2 weeks, then as needed. (Patient taking differently: Take 20 mg by mouth as directed. Take for 10 days in conjunction with pylera.. Take 20mg  omeprazole after morning meal and 20mg  after evening meal) 90 capsule 5   No current facility-administered medications on file prior to visit.    Social History   Socioeconomic History  . Marital status: Single    Spouse name: Not on file  . Number of children: 1  . Years of education: Not on file  . Highest education level: Some college, no degree  Social Needs  . Financial resource strain: Somewhat hard  . Food insecurity - worry: Never true  . Food insecurity - inability: Never true  . Transportation needs - medical: No  . Transportation needs - non-medical: No  Occupational History  . Occupation: disability    Comment: secondary to bipolar disorder  Tobacco Use  . Smoking status: Never Smoker  . Smokeless tobacco: Never Used  Substance and Sexual Activity  . Alcohol use: No     Alcohol/week: 0.0 oz  . Drug use: No  . Sexual activity: Not Currently    Birth control/protection: IUD  Other Topics Concern  . Not on file  Social History Narrative   Marital status: single; not dating in 2019.      Children: 1 son (14 yo)      Lives: with mother, son      Employment: disability for bipolar disorder since 2013.      Tobacco: never      Alcohol: none      Drugs: none      Exercise: kickboxing, strength training, Pilates.      ADLs: independent with ADLs.  Drives.      Sexual activity: no sexual activity in 2019; last sexual activity 8 years      Religion: Jehovah's witness.   Family History  Problem Relation Age of Onset  . Heart disease Mother        cardiomegaly  . Hypertension Mother   . Hyperlipidemia Mother   . Diabetes Mother   . Arthritis Mother   . Hypertension Father   . Mental illness Maternal Grandmother   . Cancer Maternal Grandmother        breast  . Depression Maternal Grandmother   . Breast cancer Maternal Grandmother   . Bipolar disorder Maternal Aunt   . Breast cancer Maternal Aunt   . ADD / ADHD Cousin   . Breast cancer Cousin   .  Hypertension Sister        Objective:    BP 120/82   Pulse (!) 117   Temp 98 F (36.7 C) (Oral)   Resp 16   Ht 5' 4.57" (1.64 m)   Wt 209 lb (94.8 kg)   LMP 09/13/2017   SpO2 99%   BMI 35.25 kg/m  Physical Exam  Constitutional: She is oriented to person, place, and time. She appears well-developed and well-nourished. No distress.  HENT:  Head: Normocephalic and atraumatic.  Right Ear: External ear normal.  Left Ear: External ear normal.  Nose: Nose normal.  Mouth/Throat: Oropharynx is clear and moist.  Eyes: Conjunctivae and EOM are normal. Pupils are equal, round, and reactive to light.  Neck: Normal range of motion. Neck supple. Carotid bruit is not present. No thyromegaly present.  Cardiovascular: Normal rate, regular rhythm, normal heart sounds and intact distal pulses. Exam reveals no  gallop and no friction rub.  No murmur heard. Pulmonary/Chest: Effort normal and breath sounds normal. She has no wheezes. She has no rales. Right breast exhibits no inverted nipple, no mass, no nipple discharge, no skin change and no tenderness. Left breast exhibits tenderness. Left breast exhibits no inverted nipple, no mass, no nipple discharge and no skin change. Breasts are symmetrical.    Abdominal: Soft. Bowel sounds are normal. She exhibits no distension and no mass. There is no tenderness. There is no rebound and no guarding.  Lymphadenopathy:    She has no cervical adenopathy.  Neurological: She is alert and oriented to person, place, and time. No cranial nerve deficit.  Skin: Skin is warm and dry. No rash noted. She is not diaphoretic. No erythema. No pallor.  Psychiatric: She has a normal mood and affect. Her behavior is normal.   No results found. Depression screen [facility] 2/9 10/08/2017 10/08/2017 08/29/2017 06/20/2017 05/30/2017  Decreased Interest 3 3 2 1  0  Down, Depressed, Hopeless 3 3 2 1  0  PHQ - 2 Score 6 6 4 2  0  Altered sleeping 3 3 2 1  -  Tired, decreased energy 1 1 2 1  -  Change in appetite 0 0 0 1 -  Feeling bad or failure about yourself  3 3 2 1  -  Trouble concentrating 3 3 0 0 -  Moving slowly or fidgety/restless 0 0 0 0 -  Suicidal thoughts 0 0 0 - -  PHQ-9 Score 16 16 10 6  -  Difficult doing work/chores - Very difficult - - -   Fall Risk  10/08/2017 10/08/2017 08/29/2017 06/20/2017 05/30/2017  Falls in the past year? Yes Yes No Yes No  Number falls in past yr: 2 or more 2 or more - 2 or more -  Injury with Fall? Yes Yes - - -  Comment - fractured ankle  - - -  Risk Factor Category  High Fall Risk High Fall Risk - - -  Risk for fall due to : - History of fall(s) - - -  Follow up - Falls prevention discussed - - -        Assessment & Plan:   1. Routine physical examination   2. Controlled type 2 diabetes mellitus without complication, without long-term current  use of insulin ([place])   3. Slow transit constipation   4. Thrombocytosis ([place])   5. Bipolar disorder, in partial remission, most recent episode hypomanic ([place])   6. Lymphocytosis     -anticipatory guidance provided --- exercise, weight loss, safe driving practices, aspirin 81mg   daily. -obtain age appropriate screening labs and labs for chronic disease management. -moderate fall risk; bipolar disorder managed by psychiatry; no evidence of hearing loss.  Discussed advanced directives and living will; also discussed end of life issues including code status.  Diabetes mellitus well controlled with dietary modification.  Reassurance provided to patient. Obtain urine microalbumin. Constipation moderately controlled.  Patient continues to struggle with management of constipation.  Recent follow-up with gastroenterology. 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  Patient has upcoming appointment with hematology to review lab results recent. Bipolar disorder:  Moderate in severity.  Managed by psychiatry.  Maintained on disability due to severity of bipolar disorder.      Orders Placed This Encounter  Procedures  . Microalbumin / creatinine urine ratio   No orders of the defined types were placed in this encounter.   Return in about 3 months (around 01/05/2018) for follow-up chronic medical conditions.   Laura Horton, M.D. Primary Care at [facility] previously [facility] [address] [place], [place]  [address] [phone] phone [phone] fax

## 2017-10-09 LAB — MICROALBUMIN / CREATININE URINE RATIO
CREATININE, UR: 226.5 mg/dL
MICROALB/CREAT RATIO: 2.5 mg/g{creat} (ref 0.0–30.0)
MICROALBUM., U, RANDOM: 5.7 ug/mL

## 2017-10-15 ENCOUNTER — Ambulatory Visit
Admission: RE | Admit: 2017-10-15 | Discharge: 2017-10-15 | Disposition: A | Discharge status: Home / Self Care | Payer: Medicare Other | Source: Ambulatory Visit | Attending: Family Medicine | Admitting: Family Medicine

## 2017-10-15 DIAGNOSIS — Z1231 Encounter for screening mammogram for malignant neoplasm of breast: Secondary | ICD-10-CM

## 2017-10-22 ENCOUNTER — Other Ambulatory Visit: Payer: Self-pay | Admitting: Pharmacist

## 2017-10-22 NOTE — Patient Outreach (Signed)
[facility]) Care Management  10/22/2017  Laura Horton [birth year] [id number]   Called patient per referral for medication assistance. Unfortunately, the patient did not answer the phone. HIPAA compliant message was left on the patient's voicemail.   Plan: Call the patient back tomorrow per protocol.   Elayne Guerin, PharmD, [place] Clinical Pharmacist [phone]

## 2017-10-23 ENCOUNTER — Other Ambulatory Visit: Payer: Self-pay | Admitting: Pharmacist

## 2017-10-23 NOTE — Patient Outreach (Signed)
Laura Horton [facility]) Care Management  [place]   10/23/2017  Laura Horton 18-Jul-1976 [id number]  Subjective: Patient was called per referral for medication assistance. HIPAA identifiers were obtained.  Patient is a 42 year old female with multiple medical conditions including but not limited to:  Bipolar disorder, panic attacks, ADD, obesity, seasonal allergies, and OCD.  Patient reported the medication she previously inquired about was [place]. Due to it being too expensive, she was switched to Lithium.  Patient also reported she is planning to move to [place] in the near future and is going to switch her insurance once she moves.  Objective:   Encounter Medications: Outpatient Encounter Medications as of 10/23/2017  Medication Sig  . clonazePAM (KLONOPIN) 2 MG tablet Take 2 mg by mouth 3 (three) times daily.   . cloNIDine (CATAPRES) 0.2 MG tablet Take 1 tablet (0.2 mg total) by mouth 4 (four) times daily. (Patient taking differently: Take 0.4 mg by mouth at bedtime. )  . lithium carbonate (ESKALITH) 450 MG CR tablet Take 450 mg by mouth daily.  . metFORMIN (GLUCOPHAGE) 500 MG tablet Take 1 tablet (500 mg total) by mouth daily with breakfast.  . omeprazole (PRILOSEC) 20 MG capsule Take 1 capsule (20 mg total) by mouth daily as needed (for reflux). For 2 weeks, then as needed. (Patient taking differently: Take 20 mg by mouth as directed. Take for 10 days in conjunction with pylera.. Take 20mg  omeprazole after morning meal and 20mg  after evening meal)  . polyethylene glycol (MIRALAX / GLYCOLAX) packet Take 17 g by mouth daily.   No facility-administered encounter medications on file as of 10/23/2017.     Functional Status: In your present state of health, do you have any difficulty performing the following activities: 10/08/2017  Hearing? N  Vision? N  Difficulty concentrating or making decisions? Y  Comment Patient has issues with remembering small things    Walking or climbing stairs? Y  Comment Patient has right knee pain that makes climbing stairs difficult  Dressing or bathing? N  Doing errands, shopping? N  Preparing Food and eating ? N  Using the Toilet? N  In the past six months, have you accidently leaked urine? N  Do you have problems with loss of bowel control? N  Managing your Medications? N  Managing your Finances? N  Housekeeping or managing your Housekeeping? N  Some recent data might be hidden    Fall/Depression Screening: Fall Risk  10/08/2017 10/08/2017 08/29/2017  Falls in the past year? Yes Yes No  Number falls in past yr: 2 or more 2 or more -  Injury with Fall? Yes Yes -  Comment - fractured ankle  -  Risk Factor Category  High Fall Risk High Fall Risk -  Risk for fall due to : - History of fall(s) -  Follow up - Falls prevention discussed -   PHQ 2/9 Scores 10/08/2017 10/08/2017 08/29/2017 06/20/2017 05/30/2017 05/23/2017 09/07/2015  PHQ - 2 Score 6 6 4 2  0 6 1  PHQ- 9 Score 16 16 10 6  - 19 -      Assessment: Patient's medications were reviewed via telephone.   Drugs sorted by system:  Neurologic/Psychologic: Clonidine Clonazepam Lithium CR   Gastrointestinal: Polyethylene Glycol Omeprazole  Endocrine: Metformin  Medication Assistance Findings:  -patient has [facility] -upon preliminary review, patient is over income for the Extra Help Program -Patient reported her issue has been with [place] (which she has been taken off of).  Anette Guarneri is a tier 5 medication with her current plan and is also a tier 5 medication with the following Medicare Advantage Plan in [place](where the patient is moving)--Hum[employer], anthem and Optima   -Patient also reported that she was previously on Metformin ER but it has been changed to immediate released Metformin due to cost.  Research also showed Metformin ER was either non-formulary or a tier 4 medication most plans in [place].  Patient said she would rather wait  until she got to [place] to make changes as she is planning on changing plans.  [place] Patient Assistance Program was called on the patient's behalf.  [employer] provides [place] to patients who do not have prescription drug coverage. Unfortunately, patients with Medicare Part D are not eligible for their program.  Patient was called back to discuss the [place] Drug plan findings as well as the [place] findings but she did not answer the phone.  Plan: Call patient back. Refer patient to [email or web address] Route to patient's PCP

## 2017-10-25 ENCOUNTER — Encounter: Payer: Self-pay | Admitting: Family Medicine

## 2017-10-26 ENCOUNTER — Other Ambulatory Visit: Payer: Self-pay | Admitting: Pharmacist

## 2017-10-26 ENCOUNTER — Encounter: Payer: Self-pay | Admitting: Pharmacist

## 2017-10-26 NOTE — Patient Outreach (Signed)
[facility]) Care Management  10/26/2017  Laura Horton Aug 24, 1976 [id number]   Patient was called to follow up on medication assistance. Unfortunately, she did not answer the phone.  HIPAA compliant message was left on the patient's voicemail.  Plan: Since this is the second unsuccessful call, patient will be sent an unsuccessful contact letter.  Elayne Guerin, PharmD, [place] Clinical Pharmacist [phone]

## 2017-10-31 ENCOUNTER — Ambulatory Visit (INDEPENDENT_AMBULATORY_CARE_PROVIDER_SITE_OTHER): Payer: Medicare Other | Admitting: Family Medicine

## 2017-10-31 ENCOUNTER — Other Ambulatory Visit: Payer: Self-pay | Admitting: Family Medicine

## 2017-10-31 ENCOUNTER — Encounter: Payer: Self-pay | Admitting: Family Medicine

## 2017-10-31 ENCOUNTER — Other Ambulatory Visit: Payer: Self-pay

## 2017-10-31 VITALS — BP 116/82 | HR 130 | Temp 100.0°F | Resp 16 | Ht 64.96 in | Wt 207.0 lb

## 2017-10-31 DIAGNOSIS — J101 Influenza due to other identified influenza virus with other respiratory manifestations: Secondary | ICD-10-CM | POA: Diagnosis not present

## 2017-10-31 DIAGNOSIS — R509 Fever, unspecified: Secondary | ICD-10-CM

## 2017-10-31 DIAGNOSIS — R6889 Other general symptoms and signs: Secondary | ICD-10-CM | POA: Diagnosis not present

## 2017-10-31 DIAGNOSIS — E119 Type 2 diabetes mellitus without complications: Secondary | ICD-10-CM | POA: Diagnosis not present

## 2017-10-31 LAB — POCT RAPID STREP A (OFFICE): Rapid Strep A Screen: NEGATIVE

## 2017-10-31 LAB — GLUCOSE, POCT (MANUAL RESULT ENTRY): POC GLUCOSE: 99 mg/dL (ref 70–99)

## 2017-10-31 LAB — POCT INFLUENZA A/B
INFLUENZA A, POC: POSITIVE — AB
Influenza B, POC: NEGATIVE

## 2017-10-31 MED ORDER — BENZONATATE 100 MG PO CAPS: 100.0000 mg | ORAL_CAPSULE | Freq: Three times a day (TID) | ORAL | 0 refills | Status: AC | PRN

## 2017-10-31 MED ORDER — MUCINEX DM 30-600 MG PO TB12: 1.0000 | ORAL_TABLET | Freq: Two times a day (BID) | ORAL | 0 refills | Status: AC

## 2017-10-31 MED ORDER — MUCINEX DM 30-600 MG PO TB12: 1.0000 | ORAL_TABLET | Freq: Two times a day (BID) | ORAL | 0 refills | Status: DC

## 2017-10-31 MED ORDER — OSELTAMIVIR PHOSPHATE 75 MG PO CAPS: 75.0000 mg | ORAL_CAPSULE | Freq: Two times a day (BID) | ORAL | 0 refills | Status: DC

## 2017-10-31 MED ORDER — OSELTAMIVIR PHOSPHATE 75 MG PO CAPS: 75.0000 mg | ORAL_CAPSULE | Freq: Two times a day (BID) | ORAL | 0 refills | Status: AC

## 2017-10-31 NOTE — Progress Notes (Signed)
Subjective:    Patient ID: Laura Horton, female    DOB: Mar 08, 1976, 41 y.o.   MRN: [id number]  10/31/2017  Fever (x 3 days ) and Nasal Congestion    HPI This 42 y.o. female presents for evaluation of fever, nasal congestion.  Onset five days ago.  Tmax 101.0.  Throat raw.  Pain with swallowing. Headache.  Nasal congestion.  Rhinorrhea.  Coughing.  Ears hurt. Eyes hurt.  Chills and sweats.  Body aches.  Mild sore throat.  Raw throat originally.  Decreased appetite.  Yellow brown mucous.  Coughing mild; less than mom and son.  Brown sputum.  Nausea; no vomiting or diarrhea.  No SOB.   Benadryl, ibuprofen, robitussin.   Sugars running not sure.   Taking Ibuprofen 400mg  yet fever persisted.   Son went to Urgent Care and diagnosed with flu.    BP Readings from Last 3 Encounters:  10/31/17 116/82  10/08/17 120/82  10/08/17 122/80   Wt Readings from Last 3 Encounters:  10/31/17 207 lb (93.9 kg)  10/08/17 209 lb (94.8 kg)  10/08/17 209 lb 2 oz (94.9 kg)   Immunization History  Administered Date(s) Administered  . Hepatitis B, adult 05/19/2016, 12/26/2016  . Hepatitis B, ped/adol 12/27/2015  . Influenza Whole 09/08/2009  . Influenza, Seasonal, Injecte, Preservative Fre 12/10/2015, 06/19/2016, 05/23/2017  . Influenza,inj,Quad PF,6+ Mos 05/23/2017  . Pneumococcal Polysaccharide-23 12/10/2015  . Tdap 03/02/2017    Review of Systems  Constitutional: Positive for activity change, chills, diaphoresis, fatigue and fever.  HENT: Positive for congestion, rhinorrhea, sore throat and voice change. Negative for ear pain, postnasal drip, sinus pressure and trouble swallowing.   Respiratory: Positive for cough. Negative for shortness of breath, wheezing and stridor.   Cardiovascular: Negative for chest pain, palpitations and leg swelling.  Gastrointestinal: Positive for nausea. Negative for abdominal pain, constipation, diarrhea and vomiting.  Skin: Negative for rash.    Neurological: Positive for headaches.    Past Medical History:  Diagnosis Date  . ADHD (attention deficit hyperactivity disorder)   . Anxiety   . Bipolar 1 disorder ([place])   . Chronic back pain   . Depression   . Diabetes mellitus without complication ([place])   . Thrombocytosis ([place])    Past Surgical History:  Procedure Laterality Date  . CHOLECYSTECTOMY     Allergies  Allergen Reactions  . Geodon [Ziprasidone Hcl] Anaphylaxis  . Seroquel [Quetiapine Fumarate] Anaphylaxis  . Carbamazepine Nausea And Vomiting and Other (See Comments)    migraine  . Quetiapine     REACTION: Weight Gain  . Ziprasidone Mesylate     REACTION: Dyspnea, swollen tongue, neck pain, torticullis   Current Outpatient Medications on File Prior to Visit  Medication Sig Dispense Refill  . clonazePAM (KLONOPIN) 2 MG tablet Take 2 mg by mouth 3 (three) times daily.     . cloNIDine (CATAPRES) 0.2 MG tablet Take 1 tablet (0.2 mg total) by mouth 4 (four) times daily. (Patient taking differently: Take 0.4 mg by mouth at bedtime. ) 120 tablet 11  . lithium carbonate (ESKALITH) 450 MG CR tablet Take 450 mg by mouth daily.    . metFORMIN (GLUCOPHAGE) 500 MG tablet Take 1 tablet (500 mg total) by mouth daily with breakfast. 90 tablet 1  . omeprazole (PRILOSEC) 20 MG capsule Take 1 capsule (20 mg total) by mouth daily as needed (for reflux). For 2 weeks, then as needed. (Patient taking differently: Take 20 mg by mouth as directed. Take for 10  days in conjunction with pylera.. Take 20mg  omeprazole after morning meal and 20mg  after evening meal) 90 capsule 5  . polyethylene glycol (MIRALAX / GLYCOLAX) packet Take 17 g by mouth daily.     No current facility-administered medications on file prior to visit.    Social History   Socioeconomic History  . Marital status: Single    Spouse name: Not on file  . Number of children: 1  . Years of education: Not on file  . Highest education level: Some college, no degree   Social Needs  . Financial resource strain: Somewhat hard  . Food insecurity - worry: Never true  . Food insecurity - inability: Never true  . Transportation needs - medical: No  . Transportation needs - non-medical: No  Occupational History  . Occupation: disability    Comment: secondary to bipolar disorder  Tobacco Use  . Smoking status: Never Smoker  . Smokeless tobacco: Never Used  Substance and Sexual Activity  . Alcohol use: No    Alcohol/week: 0.0 oz  . Drug use: No  . Sexual activity: Not Currently    Birth control/protection: IUD  Other Topics Concern  . Not on file  Social History Narrative   Marital status: single; not dating in 2019.      Children: 1 son (40 yo)      Lives: with mother, son      Employment: disability for bipolar disorder since 2013.      Tobacco: never      Alcohol: none      Drugs: none      Exercise: kickboxing, strength training, Pilates.      ADLs: independent with ADLs.  Drives.      Sexual activity: no sexual activity in 2019; last sexual activity 8 years      Religion: Jehovah's witness.   Family History  Problem Relation Age of Onset  . Heart disease Mother        cardiomegaly  . Hypertension Mother   . Hyperlipidemia Mother   . Diabetes Mother   . Arthritis Mother   . Hypertension Father   . Mental illness Maternal Grandmother   . Cancer Maternal Grandmother        breast  . Depression Maternal Grandmother   . Breast cancer Maternal Grandmother   . Bipolar disorder Maternal Aunt   . Breast cancer Maternal Aunt   . ADD / ADHD Cousin   . Breast cancer Cousin   . Hypertension Sister        Objective:    BP 116/82   Pulse (!) 130   Temp 100 F (37.8 C) (Oral)   Resp 16   Ht 5' 4.96" (1.65 m)   Wt 207 lb (93.9 kg)   SpO2 100%   BMI 34.49 kg/m  Physical Exam  Constitutional: She is oriented to person, place, and time. She appears well-developed and well-nourished. She appears ill. No distress.  HENT:  Head:  Normocephalic and atraumatic.  Right Ear: Tympanic membrane, external ear and ear canal normal.  Left Ear: Tympanic membrane, external ear and ear canal normal.  Nose: Nose normal.  Mouth/Throat: Uvula is midline and mucous membranes are normal. Posterior oropharyngeal erythema present. No oropharyngeal exudate, posterior oropharyngeal edema or tonsillar abscesses.  Eyes: Conjunctivae are normal. Pupils are equal, round, and reactive to light.  Neck: Normal range of motion. Neck supple.  Cardiovascular: Normal rate, regular rhythm and normal heart sounds. Exam reveals no gallop and no friction rub.  No murmur heard. Pulmonary/Chest: Effort normal and breath sounds normal. She has no wheezes. She has no rales.  Lymphadenopathy:    She has cervical adenopathy.  Neurological: She is alert and oriented to person, place, and time.  Skin: No rash noted. She is not diaphoretic.  Psychiatric: She has a normal mood and affect. Her behavior is normal.  Nursing note and vitals reviewed.  No results found. Depression screen [facility] 2/9 10/31/2017 10/08/2017 10/08/2017 08/29/2017 06/20/2017  Decreased Interest 0 3 3 2 1   Down, Depressed, Hopeless 0 3 3 2 1   PHQ - 2 Score 0 6 6 4 2   Altered sleeping - 3 3 2 1   Tired, decreased energy - 1 1 2 1   Change in appetite - 0 0 0 1  Feeling bad or failure about yourself  - 3 3 2 1   Trouble concentrating - 3 3 0 0  Moving slowly or fidgety/restless - 0 0 0 0  Suicidal thoughts - 0 0 0 -  PHQ-9 Score - 16 16 10 6   Difficult doing work/chores - - Very difficult - -   Fall Risk  10/31/2017 10/08/2017 10/08/2017 08/29/2017 06/20/2017  Falls in the past year? Yes Yes Yes No Yes  Number falls in past yr: 2 or more 2 or more 2 or more - 2 or more  Injury with Fall? - Yes Yes - -  Comment - - fractured ankle  - -  Risk Factor Category  - High Fall Risk High Fall Risk - -  Risk for fall due to : - - History of fall(s) - -  Follow up - - Falls prevention discussed - -    Results for orders placed or performed in visit on 10/31/17  POCT Influenza A/B  Result Value Ref Range   Influenza A, POC Positive (A) Negative   Influenza B, POC Negative Negative  POCT rapid strep A  Result Value Ref Range   Rapid Strep A Screen Negative Negative  POCT glucose (manual entry)  Result Value Ref Range   POC Glucose 99 70 - 99 mg/dl        Assessment & Plan:   1. Influenza A   2. Fever, unspecified fever cause   3. Flu-like symptoms   4. Controlled type 2 diabetes mellitus without complication, without long-term current use of insulin ([place])     New onset influenza A; rx for Tamiflu provided. Supportive care with rest, fluids, Tylenol, gargles. Rx for Mucinex DM bid and Tessalon Perles. Monitor sugars closely with acute illness. RTC for acute decline.  Orders Placed This Encounter  Procedures  . Culture, Group A Strep    Order Specific Question:   Source    Answer:   oropharynx  . POCT Influenza A/B  . POCT rapid strep A  . POCT glucose (manual entry)   Meds ordered this encounter  Medications  . DISCONTD: oseltamivir (TAMIFLU) 75 MG capsule    Sig: Take 1 capsule (75 mg total) by mouth 2 (two) times daily.    Dispense:  10 capsule    Refill:  0  . DISCONTD: Dextromethorphan-Guaifenesin (MUCINEX DM) 30-600 MG TB12    Sig: Take 1 tablet by mouth 2 (two) times daily.    Dispense:  28 each    Refill:  0  . oseltamivir (TAMIFLU) 75 MG capsule    Sig: Take 1 capsule (75 mg total) by mouth 2 (two) times daily.    Dispense:  10 capsule  Refill:  0  . Dextromethorphan-Guaifenesin (MUCINEX DM) 30-600 MG TB12    Sig: Take 1 tablet by mouth 2 (two) times daily.    Dispense:  28 each    Refill:  0  . benzonatate (TESSALON) 100 MG capsule    Sig: Take 1-2 capsules (100-200 mg total) by mouth 3 (three) times daily as needed for cough.    Dispense:  40 capsule    Refill:  0    No Follow-up on file.   Medha Pippen Elayne Guerin, M.D. Primary Care at  [facility] [address] [place], [place]  [address] [phone] phone [phone] fax

## 2017-10-31 NOTE — Patient Instructions (Addendum)
   IF you received an x-ray today, you will receive an invoice from [facility]. Please contact [facility] at [phone] with questions or concerns regarding your invoice.   IF you received labwork today, you will receive an invoice from [facility]. Please contact [facility] at [phone] with questions or concerns regarding your invoice.   Our billing staff will not be able to assist you with questions regarding bills from these companies.  You will be contacted with the lab results as soon as they are available. The fastest way to get your results is to activate your My Chart account. Instructions are located on the last page of this paperwork. If you have not heard from us regarding the results in 2 weeks, please contact this office.      Influenza, Adult Influenza, more commonly known as "the flu," is a viral infection that primarily affects the respiratory tract. The respiratory tract includes organs that help you breathe, such as the lungs, nose, and throat. The flu causes many common cold symptoms, as well as a high fever and body aches. The flu spreads easily from person to person (is contagious). Getting a flu shot (influenza vaccination) every year is the best way to prevent influenza. What are the causes? Influenza is caused by a virus. You can catch the virus by:  Breathing in droplets from an infected person's cough or sneeze.  Touching something that was recently contaminated with the virus and then touching your mouth, nose, or eyes.  What increases the risk? The following factors may make you more likely to get the flu:  Not cleaning your hands frequently with soap and water or alcohol-based hand sanitizer.  Having close contact with many people during cold and flu season.  Touching your mouth, eyes, or nose without washing or sanitizing your hands first.  Not drinking enough fluids or not eating a healthy diet.  Not getting enough sleep or  exercise.  Being under a high amount of stress.  Not getting a yearly (annual) flu shot.  You may be at a higher risk of complications from the flu, such as a severe lung infection (pneumonia), if you:  Are over the age of 65.  Are pregnant.  Have a weakened disease-fighting system (immune system). You may have a weakened immune system if you: ? Have HIV or AIDS. ? Are undergoing chemotherapy. ? Aretaking medicines that reduce the activity of (suppress) the immune system.  Have a long-term (chronic) illness, such as heart disease, kidney disease, diabetes, or lung disease.  Have a liver disorder.  Are obese.  Have anemia.  What are the signs or symptoms? Symptoms of this condition typically last 4-10 days and may include:  Fever.  Chills.  Headache, body aches, or muscle aches.  Sore throat.  Cough.  Runny or congested nose.  Chest discomfort and cough.  Poor appetite.  Weakness or tiredness (fatigue).  Dizziness.  Nausea or vomiting.  How is this diagnosed? This condition may be diagnosed based on your medical history and a physical exam. Your health care provider may do a nose or throat swab test to confirm the diagnosis. How is this treated? If influenza is detected early, you can be treated with antiviral medicine that can reduce the length of your illness and the severity of your symptoms. This medicine may be given by mouth (orally) or through an IV tube that is inserted in one of your veins. The goal of treatment is to relieve symptoms by taking   care of yourself at home. This may include taking over-the-counter medicines, drinking plenty of fluids, and adding humidity to the air in your home. In some cases, influenza goes away on its own. Severe influenza or complications from influenza may be treated in a hospital. Follow these instructions at home:  Take over-the-counter and prescription medicines only as told by your health care provider.  Use a  cool mist humidifier to add humidity to the air in your home. This can make breathing easier.  Rest as needed.  Drink enough fluid to keep your urine clear or pale yellow.  Cover your mouth and nose when you cough or sneeze.  Wash your hands with soap and water often, especially after you cough or sneeze. If soap and water are not available, use hand sanitizer.  Stay home from work or school as told by your health care provider. Unless you are visiting your health care provider, try to avoid leaving home until your fever has been gone for 24 hours without the use of medicine.  Keep all follow-up visits as told by your health care provider. This is important. How is this prevented?  Getting an annual flu shot is the best way to avoid getting the flu. You may get the flu shot in late summer, fall, or winter. Ask your health care provider when you should get your flu shot.  Wash your hands often or use hand sanitizer often.  Avoid contact with people who are sick during cold and flu season.  Eat a healthy diet, drink plenty of fluids, get enough sleep, and exercise regularly. Contact a health care provider if:  You develop new symptoms.  You have: ? Chest pain. ? Diarrhea. ? A fever.  Your cough gets worse.  You produce more mucus.  You feel nauseous or you vomit. Get help right away if:  You develop shortness of breath or difficulty breathing.  Your skin or nails turn a bluish color.  You have severe pain or stiffness in your neck.  You develop a sudden headache or sudden pain in your face or ear.  You cannot stop vomiting. This information is not intended to replace advice given to you by your health care provider. Make sure you discuss any questions you have with your health care provider. Document Released: 08/18/2000 Document Revised: 01/27/2016 Document Reviewed: 06/15/2015 Elsevier Interactive Patient Education  2017 Elsevier Inc.  

## 2017-11-02 LAB — CULTURE, GROUP A STREP: Strep A Culture: NEGATIVE

## 2017-11-13 ENCOUNTER — Other Ambulatory Visit: Payer: Self-pay | Admitting: Pharmacist

## 2017-11-13 ENCOUNTER — Ambulatory Visit: Payer: Self-pay | Admitting: Pharmacist

## 2017-11-13 NOTE — Patient Outreach (Signed)
[facility]) Care Management  11/13/2017  Laura Horton 01-Feb-1976 [id number]   Patient was called regarding medication assistance. Unfortunately, her number has been disconnected.  Patient was sent an unsuccessful contact letter and did not respond within 10 business days.  Plan:  Patient's case will be closed.  Elayne Guerin, PharmD, [facility] Clinical Pharmacist [phone]

## 2018-01-24 ENCOUNTER — Encounter: Payer: Self-pay | Admitting: Family Medicine

## 2018-01-29 IMAGING — DX DG CHEST 2V
2 series · 2 of 2 positions shown · non-contrast
Comparison: 06/19/2017

CLINICAL DATA: Nodule of left lung 8AM.M ([id number]) follow-up
study.

EXAM:
CHEST  2 VIEW

[chest pa]
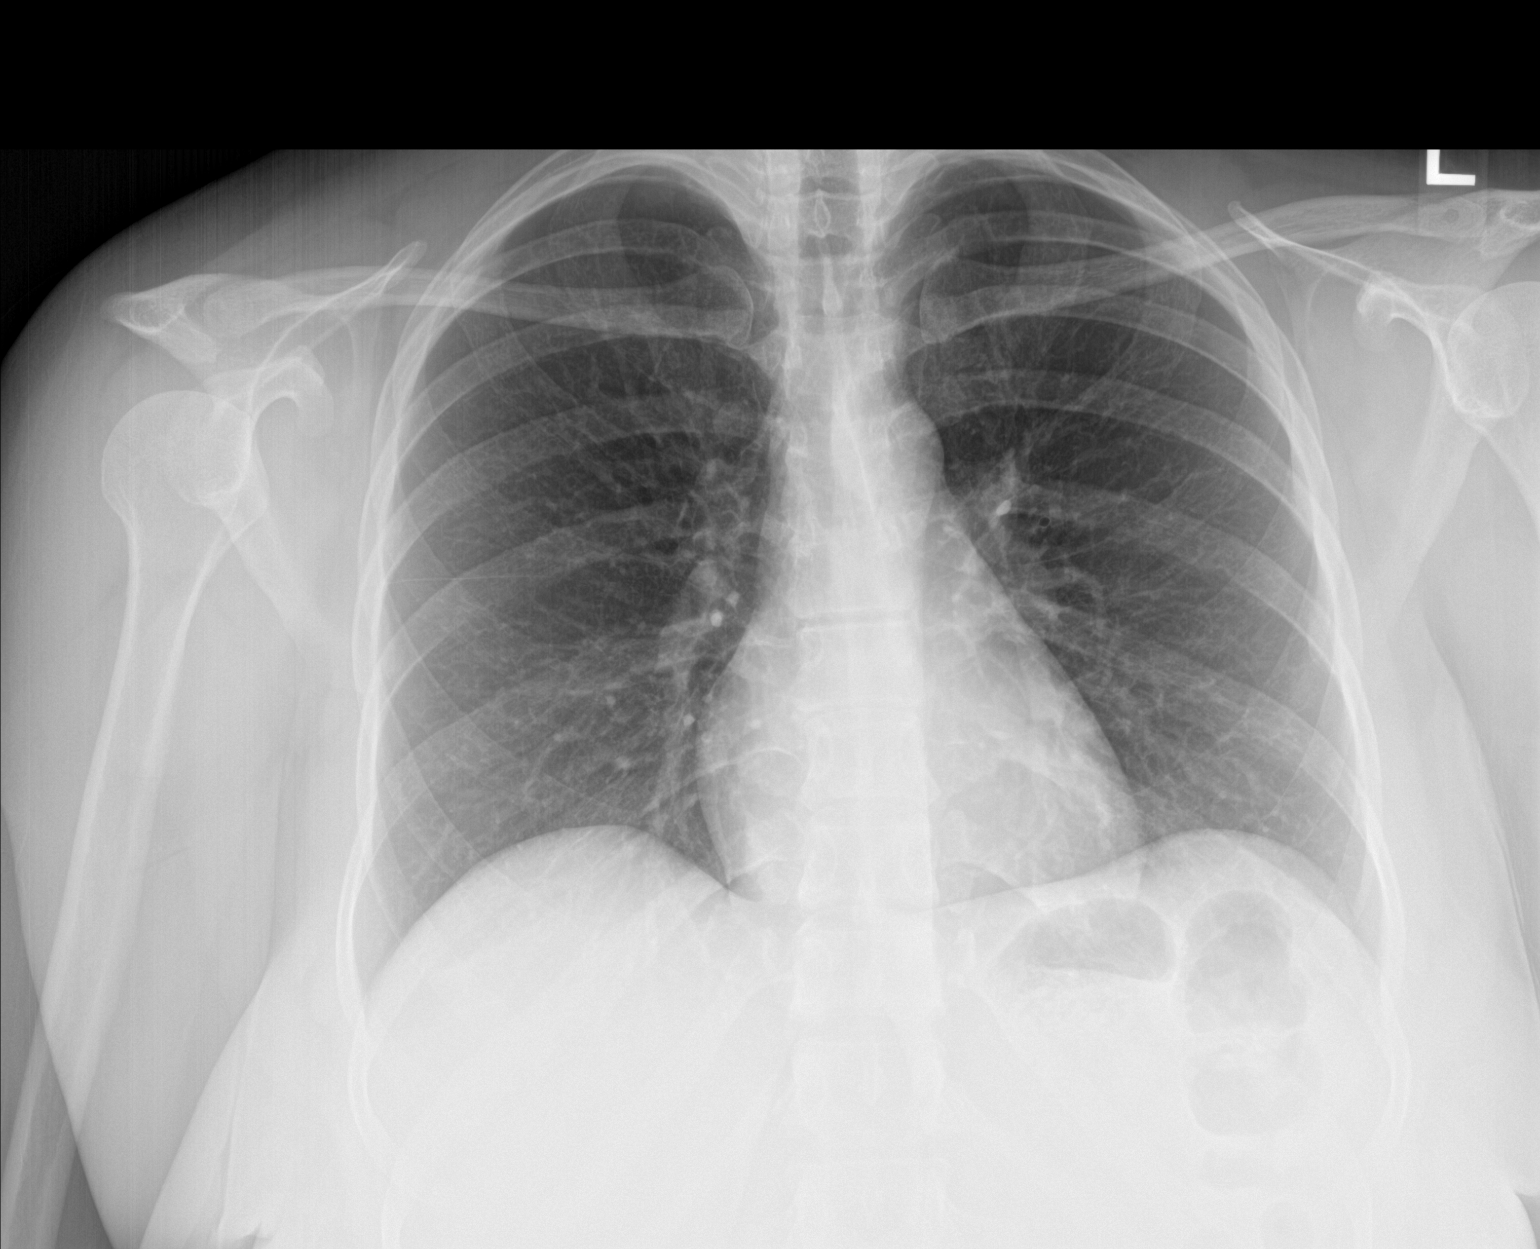

[chest lat]
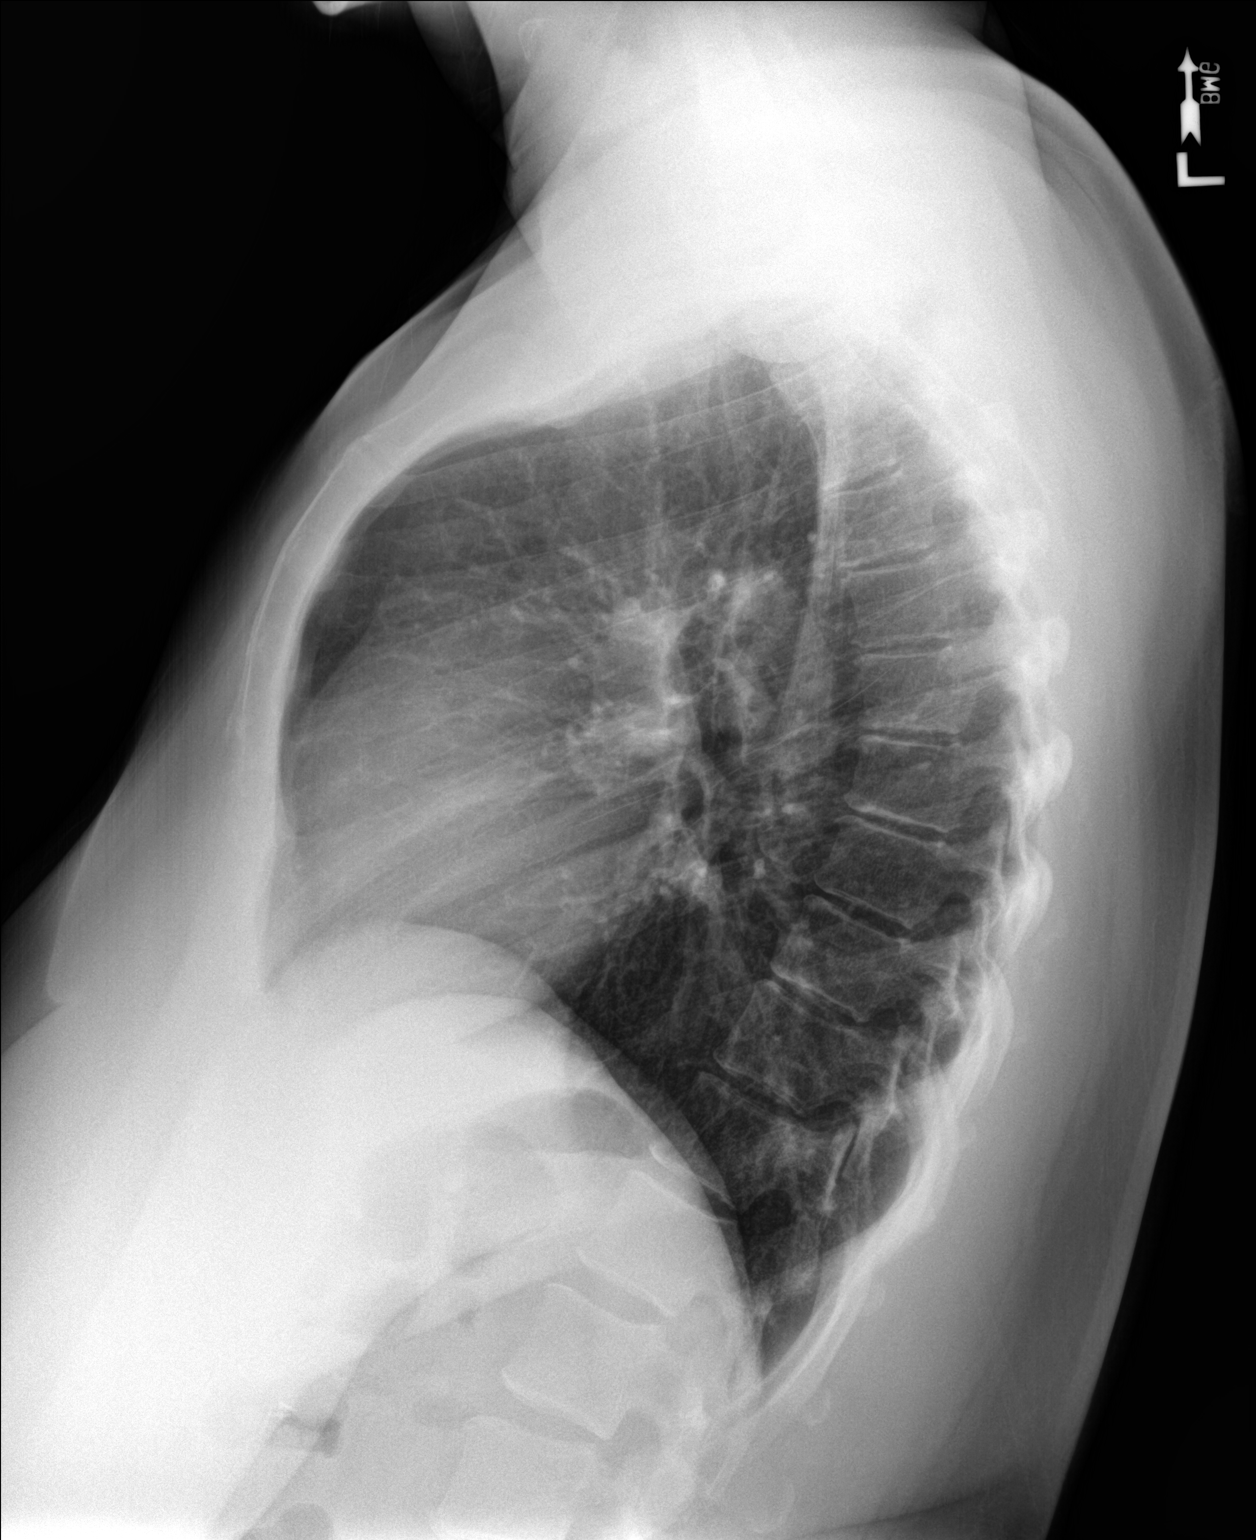

[2 of 2 positions shown; findings below may reference images not displayed]

FINDINGS: Small nodule suggested previously in the left upper lobe is no
longer visualized. Lungs are clear.

No pleural effusion or pneumothorax.

Normal heart, mediastinum and hila.

Skeletal structures are unremarkable.
IMPRESSION: 1. No pulmonary nodule.  No active cardiopulmonary disease.

## 2018-02-21 ENCOUNTER — Other Ambulatory Visit: Payer: Self-pay | Admitting: Family Medicine

## 2018-02-28 IMAGING — US US THYROID
1 series · 13 of 25 positions shown · non-contrast
Comparison: None.

CLINICAL DATA: Palpable abnormality. 40-year-old female with
thyromegaly

EXAM:
THYROID ULTRASOUND
TECHNIQUE: Ultrasound examination of the thyroid gland and adjacent soft
tissues was performed.

[Series 1: us thyroid · 0.08mm/px · 53 acquisitions, 13 frames shown]
[im 1/53]
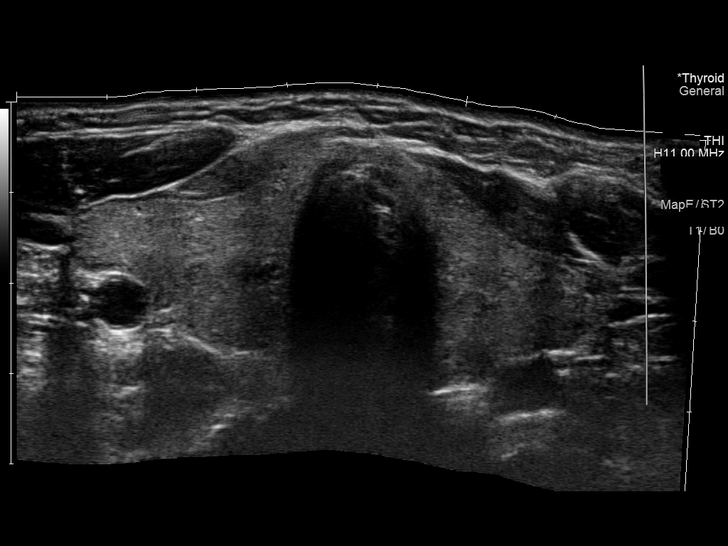
[im 5/53]
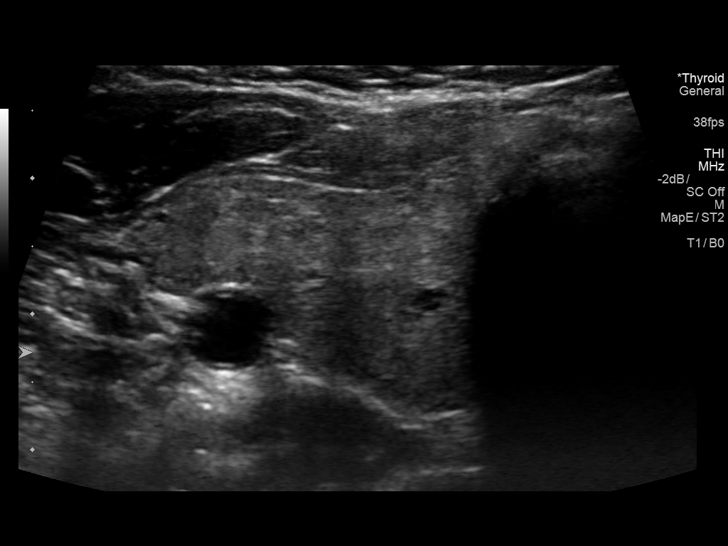
[im 9/53]
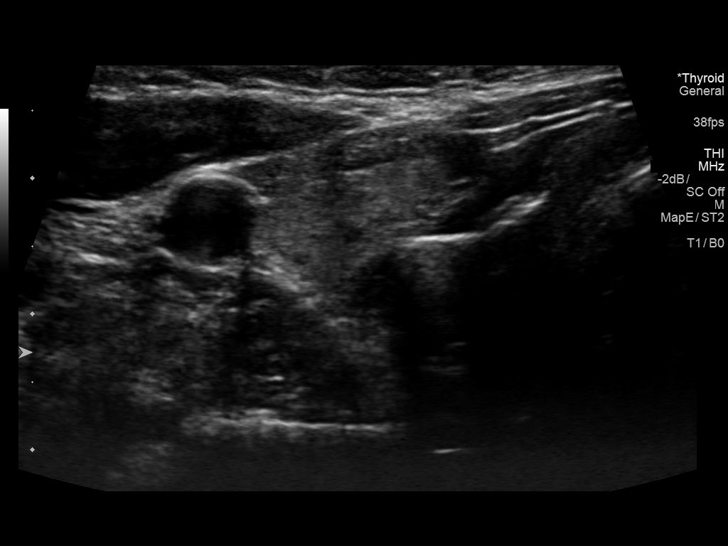
[im 14/53]
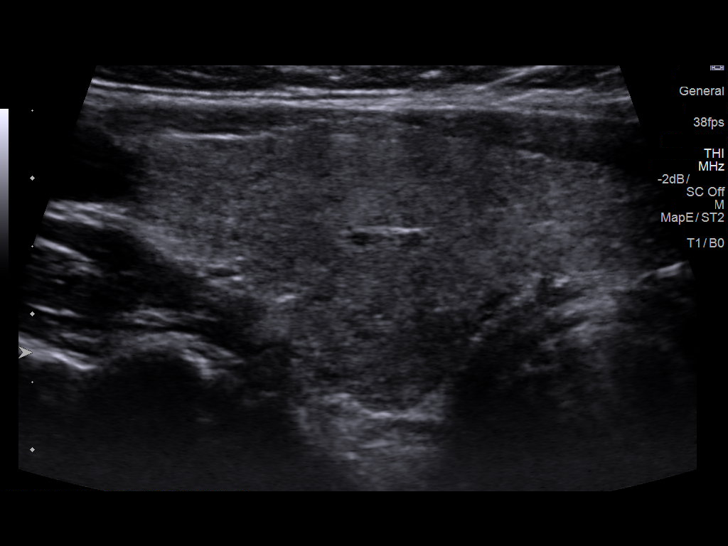
[im 18/53]
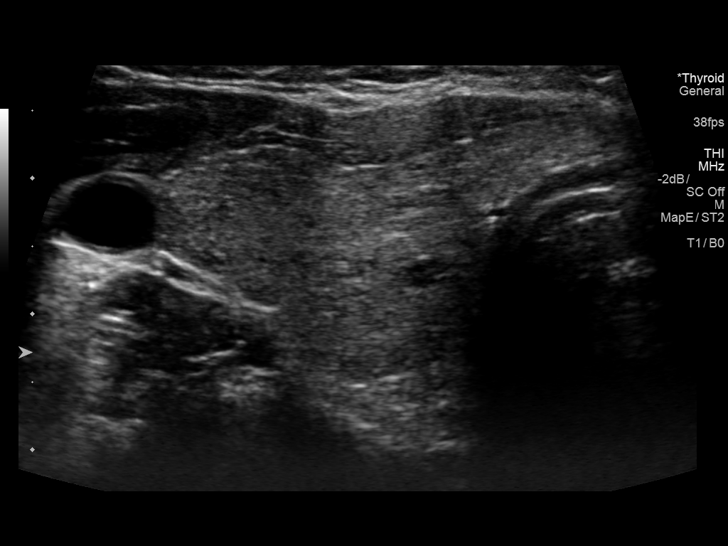
[im 22/53]
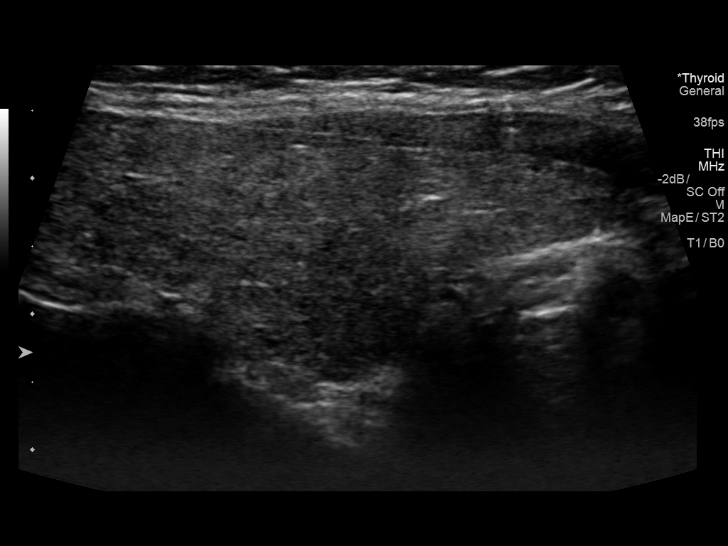
[im 27/53]
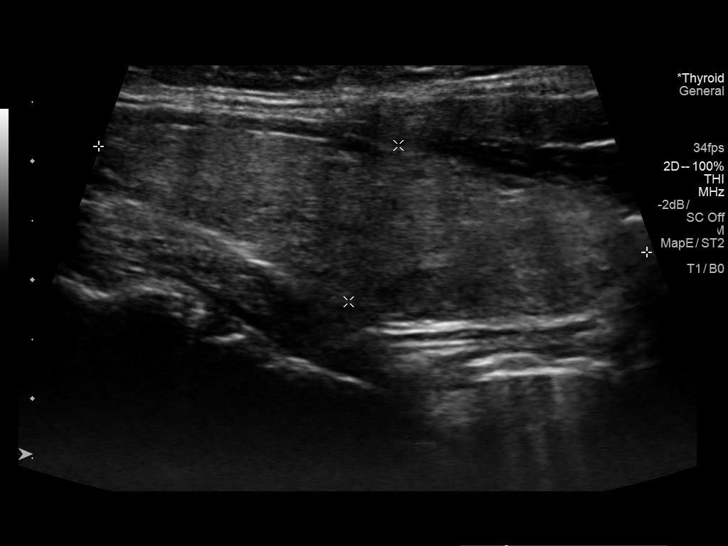
[im 31/53]
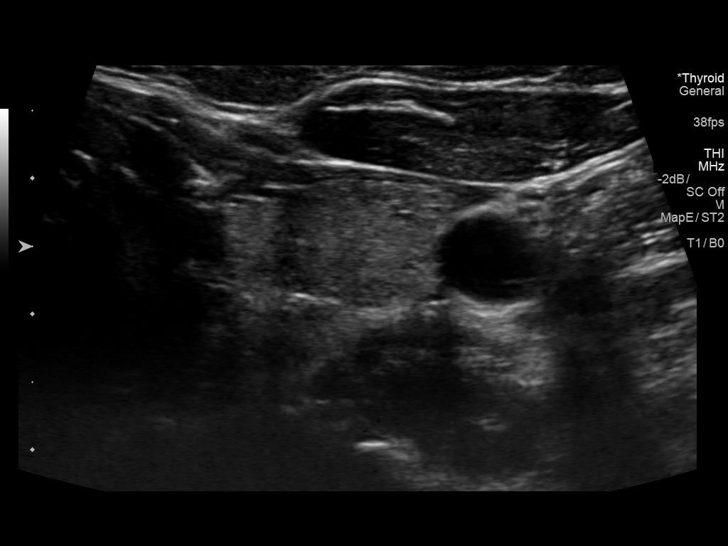
[im 35/53]
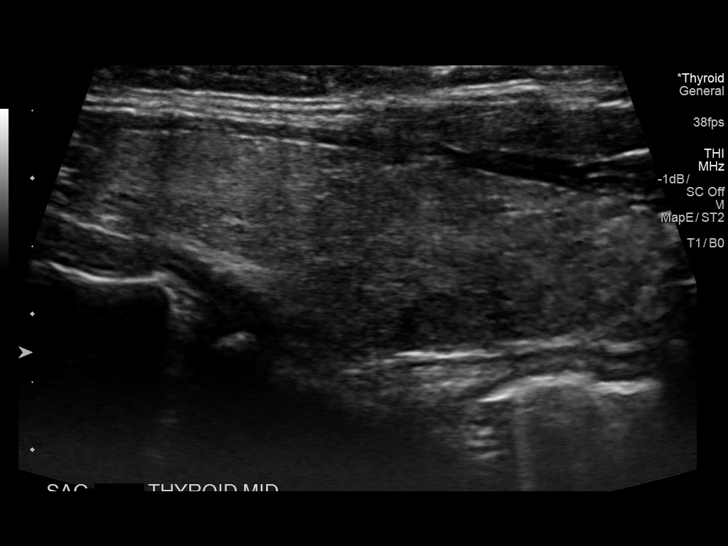
[im 40/53]
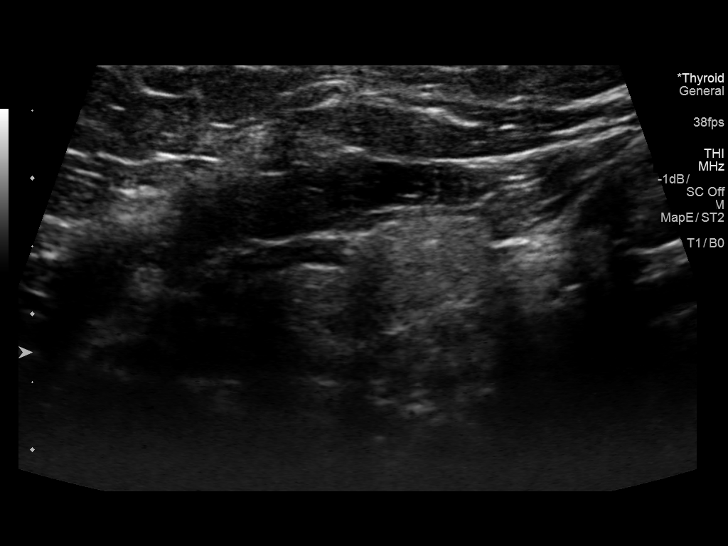
[im 44/53]
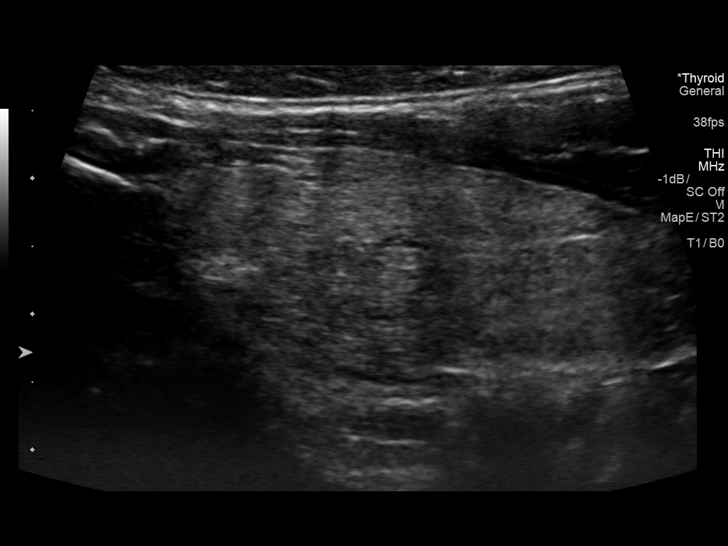
[im 48/53]
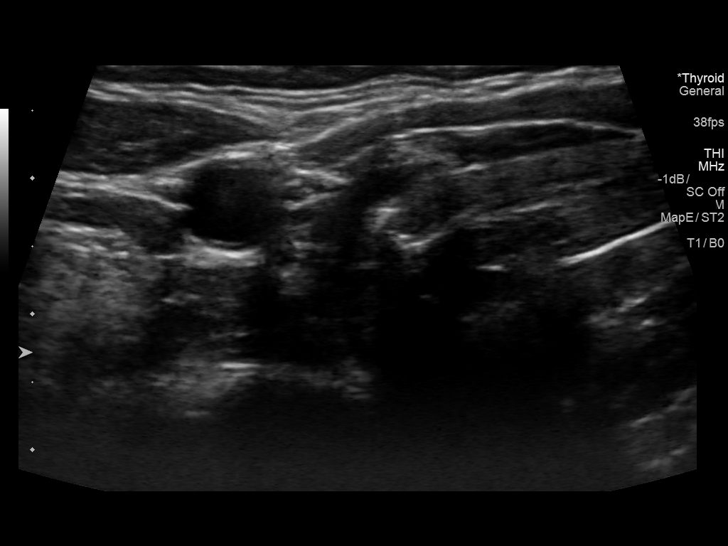
[im 53/53]
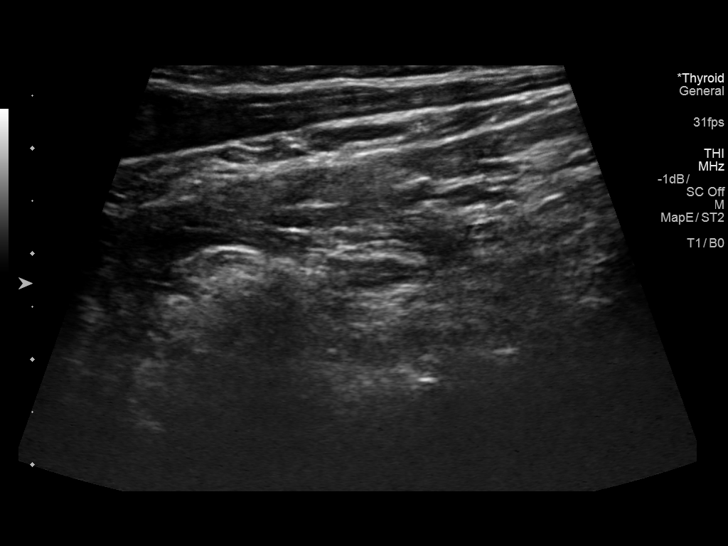

[13 of 25 positions shown; findings below may reference images not displayed]

FINDINGS: Parenchymal Echotexture: Moderately heterogenous

Isthmus: 0.3 cm

Right lobe: 5.1 x 1.9 x 2.5 cm

Left lobe: 4.7 x 1.4 x 2.5 cm

_________________________________________________________

Estimated total number of nodules >/= 1 cm: 1

Number of spongiform nodules >/=  2 cm not described below (TR1): 0

Number of mixed cystic and solid nodules >/= 1.5 cm not described
below (TR2): 0

_________________________________________________________

Nodule # 1:

Location: Right; Mid

Maximum size: 1.5 cm; Other 2 dimensions: 1.3 x 1.2 cm

Composition: solid/almost completely solid (2)

Echogenicity: hypoechoic (2)

Shape: not taller-than-wide (0)

Margins: ill-defined (0)

Echogenic foci: none (0)

ACR TI-RADS total points: 4.

ACR TI-RADS risk category: TR4 (4-6 points).

ACR TI-RADS recommendations:

**Given size (>/= 1.5 cm) and appearance, fine needle aspiration of
this moderately suspicious nodule should be considered based on
TI-RADS criteria.

_________________________________________________________

Incidental note is made of a subcentimeter hypoechoic solid nodule
in the deep aspect of the left mid gland. This lesion does not meet
criteria for further evaluation.
IMPRESSION: 1. Moderately heterogeneous and slightly enlarged thyroid gland.
2. A 1.5 cm TI-RADS category 4 nodule (#1) in the right mid gland
meets criteria to consider fine-needle aspiration biopsy.

The above is in keeping with the ACR TI-RADS recommendations - [HOSPITAL] 0130;[DATE].

## 2018-10-23 LAB — HEMOGLOBIN A1C: Hemoglobin A1C, External: 5.3 % (ref 4.8–6.0)

## 2019-04-14 LAB — HEMOGLOBIN A1C: Hemoglobin A1C, External: 5.3 % (ref 4.8–6.0)

## 2019-11-11 LAB — HEMOGLOBIN A1C: Hemoglobin A1C, External: 5.4 % (ref 4.8–6.0)

## 2020-02-17 LAB — HEMOGLOBIN A1C: Hemoglobin A1C, External: 5.4 % (ref 4.8–6.0)

## 2020-02-24 LAB — HEMOGLOBIN A1C
Estimated Avg Glucose, External: 106 mg/dL (ref 91–123)
Hemoglobin A1C, External: 5.3 % (ref 4.8–5.6)

## 2020-08-18 LAB — HEMOGLOBIN A1C
Estimated Avg Glucose, External: 110 mg/dL (ref 91–123)
Hemoglobin A1C, External: 5.5 % (ref 4.8–5.6)

## 2021-02-24 LAB — HEMOGLOBIN A1C: Hemoglobin A1C, External: 5.2 % (ref 4.8–6.0)

## 2023-01-03 NOTE — ED Provider Notes (Signed)
Attestation:    I have participated in the care of this patient.  I have reviewed pertinent clinical information.  I agree with the management and disposition of this patient.  This service has been performed in part by a resident under my direction, as the teaching physician. I fully participated, in the care of this patient, reviewed the clinical information and agree with the management and disposition of this patient.      47 year old female with extensive past medical history including chronic constipation presents for constipation associated with bloating sensation over the past few weeks.  Patient has been seen in this ED for similar symptoms as recently as 2 months ago.  Vital stable upon arrival in through ED course.  Physical exam reveals nontoxic female in no acute distress, no respiratory distress, speaking in clear and full sentences and moving all extremities spontaneously.  Abdomen is soft, nondistended without evidence of peritoneal findings.  Patient given mineral oil enema in the ED without large bowel movement.  Labs unremarkable compared to baseline per chart review.  Patient observed in the ED for extended period time without new or worse symptoms.  Had an extensive conversation with patient regarding pursuing CT abdomen/pelvis verses discharge home with MiraLax cleanout, patient wishes to go home at this time.  She received a CT abdomen/pelvis at her most recent ED visit which was unremarkable for acute intra-abdominal pathology.  Abdomen remains soft, nondistended and without evidence of peritoneal findings through ED course.  Patient able to tolerate p.o..  Plan to discharge with strict return precautions, recommendations to follow-up with PCP and MiraLax cleanout prescription.  Concerning signs/symptoms that should prompt emergent return to the ED discussed as well.  Patient verbalized understanding and agreement with plan.  All questions answered.       Larose Kells, DO  01/05/23  [id number]

## 2023-02-13 ENCOUNTER — Ambulatory Visit: Admit: 2023-02-13 | Discharge: 2023-02-13 | Payer: MEDICARE | Attending: Surgery | Primary: Family Medicine

## 2023-02-13 NOTE — Progress Notes (Addendum)
Bariatric Surgery Initial Consult    Patient: Emma Dougherty MRN: [id number]  SSN: [id number]    Date of Birth: 1976/02/06  Age: 47 y.o.  Sex: female      Subjective:      CC: Morbid Obesity    Current Weight: 234  Body mass index is 45.7 kg/m.  Ideal body weight: 45.5 kg (100 lb 4.9 oz)  Adjusted ideal body weight: 69.8 kg (153 lb 12.6 oz)  Excess Body Weight: 104    Emma Dougherty is a 47 y.o. female who is being seen for new bariatric consultation. Emma Dougherty presents with a chief complaint of persistent challenges in achieving sustainable weight loss despite efforts involving exercise, dietary modifications, and medications. She has recently initiated treatment with Mounjaro at a low dose. Previously, Emma Dougherty had trialed phentermine and Contrave, but can no longer take stimulant-based medications like phentermine due to her bipolar disorder diagnosis. While her diet currently lacks adequate fruits and vegetables, she expresses willingness to implement dietary changes.    Emma Dougherty has a history of diabetes since 2017, with her A1C levels consistently in the 5% range. She also manages bipolar disorder, anxiety, and panic disorder through a combination of medications, therapy, and psychiatric follow-up.  She has been relatively stable on her current regimen, and follows with both a therapist and a psychiatrist.  Additionally, she suffers from GERD, experiencing nighttime and morning heartburn, which is treated with omeprazole.  The patient reports chronic constipation, nausea, stomach pain, tightness, and cramping.  She thinks is some of these issues, particularly nausea and dyspepsia have worsened since starting GLP-1 agonist therapy.. To address these gastrointestinal issues, she has recently started taking Benefiber, Trulance, and a stool softener.  She did have a EGD and colonoscopy performed 2 years ago by Dr. Gevena Cotton, the results of which are not currently available.    Emma Dougherty is considering both gastric  bypass and sleeve gastrectomy for surgical weight loss.  Her target weight loss goal is around 160 pounds.    STOP-BANG score: 3    EGD/UGI: EGD 2022, records pending  GERD-HRQL score: 15    Cancer Screenings:  No cervical cancer screening on file  Date of last Mammogram: 12/25/2019  No colonoscopy on file  No cologuard on file  No FIT/FOBT on file  No flexible sigmoidoscopy on file     Past Medical History:   Diagnosis Date    Anxiety     Depression     bipolar panic disorder    Hyperlipidemia     Hypertension     Type 2 diabetes mellitus without complication (HCC)      Past Surgical History:   Procedure Laterality Date    CHOLECYSTECTOMY        Allergies   Allergen Reactions    Geodon [Ziprasidone] Anaphylaxis     Current Outpatient Medications   Medication Sig Dispense Refill    KLONOPIN 1 MG tablet Take 1 tablet by mouth 2 times daily as needed (takes half tab as needed).      clobetasol (TEMOVATE) 0.05 % external solution Apply topically      doxycycline hyclate (VIBRA-TABS) 100 MG tablet Take 1 tablet by mouth 2 times daily      hydroCHLOROthiazide (HYDRODIURIL) 25 MG tablet Take 1 tablet by mouth daily      hydrOXYzine HCl (ATARAX) 50 MG tablet Take 1 tablet by mouth every 6 hours as needed      ketoconazole (NIZORAL) 2 % shampoo Apply topically daily as needed  losartan (COZAAR) 100 MG tablet Take 1 tablet by mouth daily      CAPLYTA 42 MG capsule 1 capsule      metFORMIN (GLUCOPHAGE-XR) 500 MG extended release tablet Take 1 tablet by mouth daily (with breakfast)      minoxidil (LONITEN) 2.5 MG tablet Take 1 tablet by mouth daily      omeprazole (PRILOSEC) 20 MG delayed release capsule Take 1 capsule by mouth Daily      TRULANCE 3 MG TABS Take 1 tablet by mouth daily      ondansetron (ZOFRAN) 8 MG tablet       rosuvastatin (CRESTOR) 5 MG tablet Take 1 tablet by mouth daily      MOUNJARO 2.5 MG/0.5ML SOPN SC injection Inject 0.5 mLs into the skin once a week       No current facility-administered  medications for this visit.     Social History     Tobacco Use    Smoking status: Never    Smokeless tobacco: Never   Substance Use Topics    Alcohol use: Not Currently     No family history on file.       Review of Systems:    General: Denies fevers, chills, night sweats, fatigue, weight loss, or weight gain.    HEENT: Denies changes in auditory or visual acuity, recurrent pharyngitis, epistaxis, chronic rhinorrhea, vertigo    Respiratory: Denies increasing shortness of breath, productive cough, hemoptysis    Cardiac: Denies known history of cardiac disease, heart murmur, palpitations    GI: + GI symptoms as per HPI.  + Chronic constipation, even prior to GLP-1 agonist therapy.  Denies dysphagia, recurrent emesis, hematemesis, changes in bowel habits, hematochezia, melena    GU: Denies hematuria frequency urgency dysuria    Musculoskeletal: Denies fractures, dislocations    Neurologic: Denies history of CVA, paralysis paresthesias, recurrent cephalgia, seizures    Endocrine: Denies polyuria, polydipsia, polyphagia, heat and cold intolerance    Lymph/heme: Denies a history of malignancy, anemia, bruising, blood transfusions    Integumentary: Negative for dermatitis     Psych: + History of bipolar disorder, anxiety, panic disorder.  Has been well-controlled recently.    Objective:     Vitals:    02/13/23 0815   BP: 124/78   Pulse: 86   Resp: 18   Temp: 97.7 F (36.5 C)   Weight: 106.1 kg (234 lb)   Height: 1.524 m (5')        General: no acute distress, nontoxic in appearance.  Head: Normocephalic, atraumatic  Mouth: Clear, no overt lesions,  Neck: Supple, no masses, trachea midline  Resp: Unlabored on room air. Excursions normal and symmetrical.  Cardio: Regular rate and rhythm  Abdomen: soft, nontender, nondistended, no hernias.  Extremities: Warm, well perfused, no edema or varicosities  Neuro: Sensation and strength grossly intact and symmetrical.  Psych: Alert and oriented to person, place, and time.    Labs:      None    Assessment:     This patient is a 47 y.o. obese female with a current Body mass index is 45.7 kg/m. who is considering bariatric surgery, and would be an appropriate candidate for enrollment in the preoperative pathway.     The patient is considering Laparoscopic Gastric Bypass and Laparoscopic Sleeve Gastrectomy for surgical weight loss due to their ineffective progress with medical forms of weight loss and the urging of their physicians who care for their primary medical issues. The patient  now presents  for consideration for weight loss surgery understanding the benefits of this over a medical approach of weight loss as was discussed in our seminar on weight loss surgery. They have discussed their plans both with their family and primary care physician who is in support of their pursuit of such.    The patient's goal weight is 160 lb.   These goals are consistent with expected outcomes of their desired operation.  Her Medical goals are resolution of these health issues.    The possible short and long term  complications of the gastric bypass were also discussed, to include but not limited to;death, DVT/PE, staple line leak, bleeding, stricture formation, surgical site infection, internal hernia  and pouch dilation.  Specific weight related outcomes for success were also discussed with an emphasis on careful and close follow-up with the first year and dietary behavior modification over the first years as baseline cyclical hunger returns  The patient expressed an understanding of the above factors, and her questions were answered in their entirety.    The possible short and long term  complications of the sleeve gastrectomy were also discussed, to include but not limited to;death, DVT/PE, staple line leak, bleeding, stricture formation, surgical site infection, and reflux. I spent a significant amount of time emphasizing the reflux risk from this procedure which ranges from 25-40% postoperatively.   Specific weight related outcomes for success were also discussed with an emphasis on careful and close follow-up with the first year and dietary behavior modification over the first years as baseline cyclical hunger returns  The patient expressed an understanding of the above factors, and her questions were answered in their entirety.      Plan:     Will plan to enroll in the preoperative bariatric pathway.  She will consider her surgical options further.  She is interested in gastric bypass for the antireflux effects, but she is very concerned about the possibility of having to adjust her antipsychotic regimen, and may prefer sleeve for this reason.  Will schedule consultation with our dietitian  Will schedule for preoperative psych clearance  Standard preoperative bariatric lab panel ordered.  H. pylori breath test to be performed at midpoint evaluation  Repeat EGD deferred, patient will forward [place] her previous EGD records for upload.  Return to clinic for midpoint evaluation      I spent 45 minutes on this patient's care today, including reviewing all pertinent medical records, imaging, performing a history and physical exam, documenting, and coordinating future care.    Signed By: Adelina Mings, MD     February 13, 2023

## 2023-02-13 NOTE — Addendum Note (Signed)
Addended by: Adelina Mings on: 02/13/2023 09:49 AM     Modules accepted: Level of Service

## 2023-02-13 NOTE — Progress Notes (Signed)
Chief Complaint   Patient presents with    Surgical Consult     Confirmed video   Pt ID confirmed        02/13/2023     8:15 AM   Ambulatory Bariatric Summary   Temp 97.7 F (36.5 C)   Respirations 18   Weight - Scale 234   Height 1.524 m (5')   BMI 45.8 kg/m2   Weight - Scale 106.1 kg (234 lb)   BMI (Calculated) 45.8       Body mass index is 45.7 kg/m.

## 2023-02-19 ENCOUNTER — Inpatient Hospital Stay: Admit: 2023-02-19 | Discharge: 2023-02-19 | Payer: MEDICARE | Primary: Family Medicine

## 2023-02-19 NOTE — Progress Notes (Signed)
[facility]  [address] [facility], [address]    Patient's Name: Emma Dougherty     Age: 47 y.o.  Date of Birth: 07-Jun-1976     Sex: female                Session: 1 of  4   Surgeon:  Dr. Tanja Port    Height: 5 f    Weight:    234      Lbs.     BMI: 45   Pounds Lost since last month: 0                 Pounds Gained since last month: 0    Starting Weight: 234     Previous Month's Weight: 234  Overall Pounds Lost: 0   Overall Pounds Gained: 0    Patient has not been to support group.    Do you nicotine products:  None    Alcohol intake:  Number of drinks at a time:  None  Number of times a week: None      Class Guidelines  Office Use Only:    Guidelines are reviewed with patient at the start of every class.    1. Patient understands that weight loss trial classes must be consecutive.  Patient understands if they have 3 no shows including in person and virtual visits, they will be removed from the program.  2.  Patient understands the expectations that weight maintenance/weight loss is expected during the classes.  Failure to demonstrate changes may result in one extra month of weight loss trial, followed by a visit with the nurse practitioner.  3. Patient is also instructed to be doing their labs, blood work, psych visit, support group and any other test that the surgeon has ordered while they are working on their weight loss trial.    Other Pertinent Information:     Changes Made Since Last Class: n/a    Eating Habits and Behaviors      Today in class we talked about the key diet principles.  We start off each class talking about these principles, which include cutting out liquid calories and focusing on water or other non-calorie, non-carbonated drinks.  We also spent time talking about carbohydrates, including foods that have carbohydrates and the goal to keep daily carbohydrates under 100 grams per day.  Patient was given ideas of meal and snack  choices that are lower in carbohydrates and focus more on protein.  Patient was encouraged to start trying protein shakes and was given a list of suggestions.    The main topic of class today was: Portion Control.   We reviewed in class a power point filled with tips on ways to control portions, including using smaller plates, boxing up portions at a restaurant before starting to eat, and not eating from the container, but rather portioning snacks into smaller bags.  Patient's were encouraged to food journal, which helps increase awareness of what and how much they are eating.  It was emphasized to patient the importance of reading labels and portion sizes, but also applying these portion sizes.  Patient was given a list of items that can help to make portion control easier.  For example, a deck of cards or a palm of a hand is a proper portion of meat, a fist is a cup or a proper serving of vegetables.  Patient was given 10 tips to help  with the portion control.      Patient's current diet habits include: Patient's current diet habits include: Patient is eating 3 meals a day.  I have talked to patient about the importance of eating within 1 hour of waking up and aiming for 3 meals a day.  May use a protein drink now if they are struggling with with eating 3 x a day.    I have talked to patient about meal planning and trying to decrease intake of eating out.  Patient indicated they are eating bread, rice, pasta, crackers, chips, etc. few times a week.   Patient is snacking on crackers or chips.  This is being done 7 times a week.  I  have talked to patient about the need to cut out carbohydrates that won't be part of the diet after surgery.    Patient's fluid intake is: water, 30 ounces of water and 30 ounces of sweet tea .   We talked about the need to get into a routine now of sipping throughout the day, cutting out caffeine and carbonation, and working towards 64 ounces per day.      Physical  Activity/Exercise    Comments:     Currently for exercise, patient is not doing anything.  Patient was given a list of ideas for activity and was encouraged to incorporate 30 minutes a day into their daily routine.    Behavior Modification       Comments:   In class, we also focused on the behavior aspects of weight management.  This includes being a mindful eater and not eating in front of the TV.  Patient is also encouraged to take 20 minutes to eat a meal and eat at a table.   Patient is taking 10 minutes to eat a meal.      Based on diet history, I have provided patient with a list of changes to start making:   These changes include:  Food journal.  Write down what you are eating using apps like My Fitness Pal, Lose it, or Bariatastic or just writing down your daily intake.  Work on using a smaller plate to reduce portions.  Begin cutting out bread, rice, pasta, crackers, chips, pretzels, popcorn, etc.  These foods will not be part of your diet after surgery, so it's important to start getting in the routine of eliminating them now.  When you are further post op, we will add some complex carbohydrates to your routine, but we encourage you to get away from the simple or refined/white carbohydrates now.  Limit snacking or have a protein-based snack, if hungry.  Increase water intake!  Eliminate soda/sweet tea/fruit juices  Work on establishing an exercise routine . Start off with walking and try to build upon that.  Another option are chair exercises.  Work on trying to make a meal last 30 minutes.  Attend a support group meeting, which are the 2nd Thursday of the month at 6 pm.  Even months are in person.  Odd months are online (link will be sent morning of meeting).      Illene Bolus, RD      02/19/2023

## 2023-03-14 ENCOUNTER — Encounter

## 2023-03-21 ENCOUNTER — Encounter: Payer: MEDICARE | Primary: Family Medicine

## 2023-03-21 NOTE — Progress Notes (Addendum)
03/21/23:  Patient e-mailed me and stated she would not be able to make her appointment on July 17, which was in person.  I have contacted her 3 times and left voice mails and I have e-mailed patient to reschedule this visit.  I have expressed to her the importance of getting this visit rescheduled before the month is over.    03/23/23:  Patient was contacted again and left a message regarding her missed nutrition visit.    Adela Ports, MS RD

## 2023-03-27 ENCOUNTER — Ambulatory Visit: Payer: MEDICARE | Primary: Family Medicine

## 2023-04-17 ENCOUNTER — Encounter: Payer: MEDICARE | Attending: Surgery | Primary: Family Medicine

## 2023-05-01 ENCOUNTER — Ambulatory Visit: Payer: MEDICARE | Primary: Family Medicine
